# Patient Record
Sex: Male | Born: 1960
Health system: Southern US, Community
[De-identification: ages and names within clinical notes are randomized; demographics above are authoritative.]

## PROBLEM LIST (undated history)

## (undated) DIAGNOSIS — F419 Anxiety disorder, unspecified: Secondary | ICD-10-CM

## (undated) DIAGNOSIS — Z8673 Personal history of transient ischemic attack (TIA), and cerebral infarction without residual deficits: Secondary | ICD-10-CM

## (undated) DIAGNOSIS — E785 Hyperlipidemia, unspecified: Secondary | ICD-10-CM

## (undated) DIAGNOSIS — I10 Essential (primary) hypertension: Secondary | ICD-10-CM

## (undated) DIAGNOSIS — E559 Vitamin D deficiency, unspecified: Secondary | ICD-10-CM

## (undated) DIAGNOSIS — R7303 Prediabetes: Secondary | ICD-10-CM

## (undated) DIAGNOSIS — G43909 Migraine, unspecified, not intractable, without status migrainosus: Secondary | ICD-10-CM

## (undated) HISTORY — DX: Personal history of transient ischemic attack (TIA), and cerebral infarction without residual deficits: Z86.73

## (undated) HISTORY — DX: Essential (primary) hypertension: I10

## (undated) HISTORY — DX: Prediabetes: R73.03

## (undated) HISTORY — DX: Hyperlipidemia, unspecified: E78.5

## (undated) HISTORY — DX: Migraine, unspecified, not intractable, without status migrainosus: G43.909

## (undated) HISTORY — DX: Anxiety disorder, unspecified: F41.9

## (undated) HISTORY — DX: Vitamin D deficiency, unspecified: E55.9

---

## 1989-08-13 HISTORY — PX: REFRACTIVE SURGERY: SHX103

## 2012-02-22 ENCOUNTER — Other Ambulatory Visit (HOSPITAL_COMMUNITY): Payer: Self-pay | Admitting: Internal Medicine

## 2012-02-22 ENCOUNTER — Ambulatory Visit (HOSPITAL_COMMUNITY)
Admission: RE | Admit: 2012-02-22 | Discharge: 2012-02-22 | Disposition: A | Discharge status: Home / Self Care | Payer: BC Managed Care – PPO | Source: Ambulatory Visit | Attending: Internal Medicine | Admitting: Internal Medicine

## 2012-02-22 DIAGNOSIS — I10 Essential (primary) hypertension: Secondary | ICD-10-CM | POA: Insufficient documentation

## 2013-05-29 ENCOUNTER — Encounter: Payer: Self-pay | Admitting: Internal Medicine

## 2013-05-29 DIAGNOSIS — E119 Type 2 diabetes mellitus without complications: Secondary | ICD-10-CM | POA: Insufficient documentation

## 2013-05-29 DIAGNOSIS — E785 Hyperlipidemia, unspecified: Secondary | ICD-10-CM | POA: Insufficient documentation

## 2013-05-29 DIAGNOSIS — I10 Essential (primary) hypertension: Secondary | ICD-10-CM | POA: Insufficient documentation

## 2013-05-29 DIAGNOSIS — G43909 Migraine, unspecified, not intractable, without status migrainosus: Secondary | ICD-10-CM | POA: Insufficient documentation

## 2013-05-29 DIAGNOSIS — E1169 Type 2 diabetes mellitus with other specified complication: Secondary | ICD-10-CM | POA: Insufficient documentation

## 2013-05-29 DIAGNOSIS — E559 Vitamin D deficiency, unspecified: Secondary | ICD-10-CM | POA: Insufficient documentation

## 2013-05-29 DIAGNOSIS — F40231 Fear of injections and transfusions: Secondary | ICD-10-CM | POA: Insufficient documentation

## 2013-06-17 ENCOUNTER — Encounter: Payer: Self-pay | Admitting: Physician Assistant

## 2013-06-18 ENCOUNTER — Ambulatory Visit: Payer: Self-pay | Admitting: Physician Assistant

## 2013-06-30 ENCOUNTER — Encounter: Payer: Self-pay | Admitting: Physician Assistant

## 2013-06-30 ENCOUNTER — Encounter: Payer: PRIVATE HEALTH INSURANCE | Admitting: Physician Assistant

## 2013-06-30 NOTE — Progress Notes (Signed)
This encounter was created in error - please disregard.

## 2013-07-15 ENCOUNTER — Ambulatory Visit (INDEPENDENT_AMBULATORY_CARE_PROVIDER_SITE_OTHER): Payer: BC Managed Care – PPO | Admitting: Physician Assistant

## 2013-07-15 ENCOUNTER — Encounter: Payer: Self-pay | Admitting: Physician Assistant

## 2013-07-15 VITALS — BP 148/88 | HR 76 | Temp 98.0°F | Resp 16 | Wt 203.0 lb

## 2013-07-15 DIAGNOSIS — D485 Neoplasm of uncertain behavior of skin: Secondary | ICD-10-CM

## 2013-07-15 DIAGNOSIS — I10 Essential (primary) hypertension: Secondary | ICD-10-CM

## 2013-07-15 NOTE — Progress Notes (Signed)
Chief Complaint: Patient presents for evaluation of a skin lesion.   Exam: Filed Vitals:   07/15/13 1600  BP: 148/88  Pulse: 76  Temp: 98 F (36.7 C)  Resp: 16    Border: irref Color: Dark black with white and pink Size: 3x43mm Location:center mid back  Anesthesia: Lidocaine 1% without epinephrine without added sodium bicarbonate  Procedure Details   The risks, benefits, indications, potential complications, and alternatives were explained to the patient and informed consent obtained.  SUTURE: The lesion and surrounding area was given sterile prep using alcohol and draped in the usual sterile fashion. A 11 blade was used to excise an elliptical area of skin approximately 4cm by 4cm. The wound was closed with 4-0 Nylon using simple interrupted stitches. Antibiotic ointment and a sterile dressing applied. The specimen was sent for pathologic examination. The patient tolerated the procedure well with minimal blood loss.    Condition: Stable  Complications:  None  Diagnosis: 238.2- nevus of unknown origin HTN- monitor at home  Procedure code: 44010  Plan: 1. Instructed to keep the wound dry and covered for 24-48 hours and clean thereafter. 2. Warning signs of infection were reviewed.    3. Recommended that the patient use OTC acetaminophen as needed for pain.   4. Return for suture removal in 7 days.

## 2013-07-15 NOTE — Patient Instructions (Signed)
Wound Infection  A wound infection happens when a type of germ (bacteria) starts growing in the wound. In some cases, this can cause the wound to break open. If cared for properly, the infected wound will heal from the inside to the outside. Wound infections need treatment.  CAUSES  An infection is caused by bacteria growing in the wound.   SYMPTOMS    Increase in redness, swelling, or pain at the wound site.   Increase in drainage at the wound site.   Wound or bandage (dressing) starts to smell bad.   Fever.   Feeling tired or fatigued.   Pus draining from the wound.  TREATMENT   You caregiver will prescribe antibiotic medicine. The wound infection should improve within 24 to 48 hours. Any redness around the wound should stop spreading and the wound should be less painful.   HOME CARE INSTRUCTIONS    Only take over-the-counter or prescription medicines for pain, discomfort, or fever as directed by your caregiver.   Take your antibiotics as directed. Finish them even if you start to feel better.   Gently wash the area with mild soap and water 2 times a day, or as directed. Rinse off the soap. Pat the area dry with a clean towel. Do not rub the wound. This may cause bleeding.   Follow your caregiver's instructions for how often you need to change the dressing.   Apply ointment and a dressing to the wound as directed.   If the dressing sticks, moisten it with soapy water and gently remove it.   Change the bandage right away if it becomes wet, dirty, or develops a bad smell.   Take showers. Do not take tub baths, swim, or do anything that may soak the wound until it is healed.   Avoid exercises that make you sweat heavily.   Use anti-itch medicine as directed by your caregiver. The wound may itch when it is healing. Do not pick or scratch at the wound.   Follow up with your caregiver to get your wound rechecked as directed.  SEEK MEDICAL CARE IF:   You have an increase in swelling, pain, or redness  around the wound.   You have an increase in the amount of pus coming from the wound.   There is a bad smell coming from the wound.   More of the wound breaks open.   You have a fever.  MAKE SURE YOU:    Understand these instructions.   Will watch your condition.   Will get help right away if you are not doing well or get worse.  Document Released: 04/28/2003 Document Revised: 10/22/2011 Document Reviewed: 12/03/2010  [employer] Patient Information 2014 [employer].

## 2013-07-28 ENCOUNTER — Ambulatory Visit (INDEPENDENT_AMBULATORY_CARE_PROVIDER_SITE_OTHER): Payer: BC Managed Care – PPO | Admitting: Physician Assistant

## 2013-07-28 ENCOUNTER — Encounter: Payer: Self-pay | Admitting: Physician Assistant

## 2013-07-28 VITALS — BP 142/88 | HR 64 | Temp 98.0°F | Resp 16 | Wt 203.0 lb

## 2013-07-28 DIAGNOSIS — Z4802 Encounter for removal of sutures: Secondary | ICD-10-CM

## 2013-07-28 NOTE — Progress Notes (Signed)
Subjective:    Gabriel Hoover is a 52 y.o. male who had a dysplastic nevus removal from his mid back that required suture closure with 5 sutures.  He denies pain, redness, or drainage from the wound.   The following portions of the patient's history were reviewed and updated as appropriate: allergies, current medications, past family history, past medical history, past social history, past surgical history and problem list.  Review of Systems A comprehensive review of systems was negative.    Objective:    BP 142/88  Pulse 64  Temp(Src) 98 F (36.7 C)  Resp 16  Wt 203 lb (92.08 kg) Injury exam:  A 0.5 cm laceration noted on the mid back is healing well, without evidence of infection.    Assessment:    dysplastic nevus  Suture area is healing well, without evidence of infection.    Plan:     1. 5 sutures were removed. 2. Wound care discussed. 3. Follow up as needed.  4. Will continue to monitor other moles.

## 2013-08-19 ENCOUNTER — Ambulatory Visit (INDEPENDENT_AMBULATORY_CARE_PROVIDER_SITE_OTHER): Payer: BC Managed Care – PPO | Admitting: Physician Assistant

## 2013-08-19 ENCOUNTER — Encounter: Payer: Self-pay | Admitting: Physician Assistant

## 2013-08-19 VITALS — BP 140/88 | HR 68 | Temp 97.0°F | Resp 16 | Ht 69.0 in | Wt 202.0 lb

## 2013-08-19 DIAGNOSIS — I10 Essential (primary) hypertension: Secondary | ICD-10-CM

## 2013-08-19 DIAGNOSIS — E785 Hyperlipidemia, unspecified: Secondary | ICD-10-CM

## 2013-08-19 DIAGNOSIS — R7309 Other abnormal glucose: Secondary | ICD-10-CM

## 2013-08-19 DIAGNOSIS — Z79899 Other long term (current) drug therapy: Secondary | ICD-10-CM

## 2013-08-19 DIAGNOSIS — E559 Vitamin D deficiency, unspecified: Secondary | ICD-10-CM

## 2013-08-19 DIAGNOSIS — R7303 Prediabetes: Secondary | ICD-10-CM

## 2013-08-19 LAB — LIPID PANEL
Cholesterol: 231 mg/dL — ABNORMAL HIGH (ref 0–200)
HDL: 43 mg/dL (ref >39)
LDL Cholesterol: 152 mg/dL — ABNORMAL HIGH (ref 0–99)
TRIGLYCERIDES: 181 mg/dL — AB (ref <150)
Total CHOL/HDL Ratio: 5.4 Ratio
VLDL: 36 mg/dL (ref 0–40)

## 2013-08-19 LAB — CBC WITH DIFFERENTIAL/PLATELET
BASOS ABS: 0.1 10*3/uL (ref 0.0–0.1)
BASOS PCT: 1 % (ref 0–1)
EOS ABS: 0.3 10*3/uL (ref 0.0–0.7)
Eosinophils Relative: 4 % (ref 0–5)
HEMATOCRIT: 45.4 % (ref 39.0–52.0)
HEMOGLOBIN: 16.1 g/dL (ref 13.0–17.0)
Lymphocytes Relative: 30 % (ref 12–46)
Lymphs Abs: 2.5 10*3/uL (ref 0.7–4.0)
MCH: 29.4 pg (ref 26.0–34.0)
MCHC: 35.5 g/dL (ref 30.0–36.0)
MCV: 83 fL (ref 78.0–100.0)
MONO ABS: 0.6 10*3/uL (ref 0.1–1.0)
MONOS PCT: 8 % (ref 3–12)
NEUTROS ABS: 4.7 10*3/uL (ref 1.7–7.7)
Neutrophils Relative %: 57 % (ref 43–77)
Platelets: 260 10*3/uL (ref 150–400)
RBC: 5.47 MIL/uL (ref 4.22–5.81)
RDW: 13.4 % (ref 11.5–15.5)
WBC: 8.2 10*3/uL (ref 4.0–10.5)

## 2013-08-19 LAB — HEPATIC FUNCTION PANEL
ALBUMIN: 4.4 g/dL (ref 3.5–5.2)
ALT: 16 U/L (ref 0–53)
AST: 17 U/L (ref 0–37)
Alkaline Phosphatase: 95 U/L (ref 39–117)
Bilirubin, Direct: 0.1 mg/dL (ref 0.0–0.3)
Indirect Bilirubin: 0.6 mg/dL (ref 0.0–0.9)
TOTAL PROTEIN: 7.5 g/dL (ref 6.0–8.3)
Total Bilirubin: 0.7 mg/dL (ref 0.3–1.2)

## 2013-08-19 LAB — HEMOGLOBIN A1C
Hgb A1c MFr Bld: 6.5 % — ABNORMAL HIGH (ref <5.7)
MEAN PLASMA GLUCOSE: 140 mg/dL — AB (ref <117)

## 2013-08-19 LAB — BASIC METABOLIC PANEL WITH GFR
BUN: 15 mg/dL (ref 6–23)
CALCIUM: 9.8 mg/dL (ref 8.4–10.5)
CO2: 28 mEq/L (ref 19–32)
CREATININE: 1.06 mg/dL (ref 0.50–1.35)
Chloride: 100 mEq/L (ref 96–112)
GFR, EST NON AFRICAN AMERICAN: 80 mL/min
GLUCOSE: 140 mg/dL — AB (ref 70–99)
Potassium: 4 mEq/L (ref 3.5–5.3)
Sodium: 140 mEq/L (ref 135–145)

## 2013-08-19 LAB — TSH: TSH: 3.909 u[IU]/mL (ref 0.350–4.500)

## 2013-08-19 LAB — MAGNESIUM: Magnesium: 1.9 mg/dL (ref 1.5–2.5)

## 2013-08-19 NOTE — Patient Instructions (Addendum)
Lateral Epicondylitis (Tennis Elbow) with Rehab Lateral epicondylitis involves inflammation and pain around the outer portion of the elbow. The pain is caused by inflammation of the tendons in the forearm that bring back (extend) the wrist. Lateral epicondylittis is also called tennis elbow, because it is very common in tennis players. However, it may occur in any individual who extends the wrist repetitively. If lateral epicondylitis is left untreated, it may become a chronic problem. SYMPTOMS   Pain, tenderness, and inflammation on the outer (lateral) side of the elbow.  Pain or weakness with gripping activities.  Pain that increases with wrist twisting motions (playing tennis, using a screwdriver, opening a door or a jar).  Pain with lifting objects, including a coffee cup. CAUSES  Lateral epicondylitis is caused by inflammation of the tendons that extend the wrist. Causes of injury may include:  Repetitive stress and strain on the muscles and tendons that extend the wrist.  Sudden change in activity level or intensity.  Incorrect grip in racquet sports.  Incorrect grip size of racquet (often too large).  Incorrect hitting position or technique (usually backhand, leading with the elbow).  Using a racket that is too heavy. RISK INCREASES WITH:  Sports or occupations that require repetitive and/or strenuous forearm and wrist movements (tennis, squash, racquetball, carpentry).  Poor wrist and forearm strength and flexibility.  Failure to warm up properly before activity.  Resuming activity before healing, rehabilitation, and conditioning are complete. PREVENTION   Warm up and stretch properly before activity.  Maintain physical fitness:  Strength, flexibility, and endurance.  Cardiovascular fitness.  Wear and use properly fitted equipment.  Learn and use proper technique and have a coach correct improper technique.  Wear a tennis elbow (counterforce) brace. PROGNOSIS   The course of this condition depends on the degree of the injury. If treated properly, acute cases (symptoms lasting less than 4 weeks) are often resolved in 2 to 6 weeks. Chronic (longer lasting cases) often resolve in 3 to 6 months, but may require physical therapy. RELATED COMPLICATIONS   Frequently recurring symptoms, resulting in a chronic problem. Properly treating the problem the first time decreases frequency of recurrence.  Chronic inflammation, scarring tendon degeneration, and partial tendon tear, requiring surgery.  Delayed healing or resolution of symptoms. TREATMENT  Treatment first involves the use of ice and medicine, to reduce pain and inflammation. Strengthening and stretching exercises may help reduce discomfort, if performed regularly. These exercises may be performed at home, if the condition is an acute injury. Chronic cases may require a referral to a physical therapist for evaluation and treatment. Your caregiver may advise a corticosteroid injection, to help reduce inflammation. Rarely, surgery is needed. MEDICATION  If pain medicine is needed, nonsteroidal anti-inflammatory medicines (aspirin and ibuprofen), or other minor pain relievers (acetaminophen), are often advised.  Do not take pain medicine for 7 days before surgery.  Prescription pain relievers may be given, if your caregiver thinks they are needed. Use only as directed and only as much as you need.  Corticosteroid injections may be recommended. These injections should be reserved only for the most severe cases, because they can only be given a certain number of times. HEAT AND COLD  Cold treatment (icing) should be applied for 10 to 15 minutes every 2 to 3 hours for inflammation and pain, and immediately after activity that aggravates your symptoms. Use ice packs or an ice massage.  Heat treatment may be used before performing stretching and strengthening activities prescribed by your  caregiver, physical  therapist, or athletic trainer. Use a heat pack or a warm water soak. SEEK MEDICAL CARE IF: Symptoms get worse or do not improve in 2 weeks, despite treatment. EXERCISES  RANGE OF MOTION (ROM) AND STRETCHING EXERCISES - Epicondylitis, Lateral (Tennis Elbow) These exercises may help you when beginning to rehabilitate your injury. Your symptoms may go away with or without further involvement from your physician, physical therapist or athletic trainer. While completing these exercises, remember:   Restoring tissue flexibility helps normal motion to return to the joints. This allows healthier, less painful movement and activity.  An effective stretch should be held for at least 30 seconds.  A stretch should never be painful. You should only feel a gentle lengthening or release in the stretched tissue. RANGE OF MOTION  Wrist Flexion, Active-Assisted  Extend your right / left elbow with your fingers pointing down.*  Gently pull the back of your hand towards you, until you feel a gentle stretch on the top of your forearm.  Hold this position for __________ seconds. Repeat __________ times. Complete this exercise __________ times per day.  *If directed by your physician, physical therapist or athletic trainer, complete this stretch with your elbow bent, rather than extended. RANGE OF MOTION  Wrist Extension, Active-Assisted  Extend your right / left elbow and turn your palm upwards.*  Gently pull your palm and fingertips back, so your wrist extends and your fingers point more toward the ground.  You should feel a gentle stretch on the inside of your forearm.  Hold this position for __________ seconds. Repeat __________ times. Complete this exercise __________ times per day. *If directed by your physician, physical therapist or athletic trainer, complete this stretch with your elbow bent, rather than extended. STRETCH - Wrist Flexion  Place the back of your right / left hand on a tabletop,  leaving your elbow slightly bent. Your fingers should point away from your body.  Gently press the back of your hand down onto the table by straightening your elbow. You should feel a stretch on the top of your forearm.  Hold this position for __________ seconds. Repeat __________ times. Complete this stretch __________ times per day.  STRETCH  Wrist Extension   Place your right / left fingertips on a tabletop, leaving your elbow slightly bent. Your fingers should point backwards.  Gently press your fingers and palm down onto the table by straightening your elbow. You should feel a stretch on the inside of your forearm.  Hold this position for __________ seconds. Repeat __________ times. Complete this stretch __________ times per day.  STRENGTHENING EXERCISES - Epicondylitis, Lateral (Tennis Elbow) These exercises may help you when beginning to rehabilitate your injury. They may resolve your symptoms with or without further involvement from your physician, physical therapist or athletic trainer. While completing these exercises, remember:   Muscles can gain both the endurance and the strength needed for everyday activities through controlled exercises.  Complete these exercises as instructed by your physician, physical therapist or athletic trainer. Increase the resistance and repetitions only as guided.  You may experience muscle soreness or fatigue, but the pain or discomfort you are trying to eliminate should never worsen during these exercises. If this pain does get worse, stop and make sure you are following the directions exactly. If the pain is still present after adjustments, discontinue the exercise until you can discuss the trouble with your caregiver. STRENGTH Wrist Flexors  Sit with your right / left forearm palm-up and  fully supported on a table or countertop. Your elbow should be resting below the height of your shoulder. Allow your wrist to extend over the edge of the  surface.  Loosely holding a __________ weight, or a piece of rubber exercise band or tubing, slowly curl your hand up toward your forearm.  Hold this position for __________ seconds. Slowly lower the wrist back to the starting position in a controlled manner. Repeat __________ times. Complete this exercise __________ times per day.  STRENGTH  Wrist Extensors  Sit with your right / left forearm palm-down and fully supported on a table or countertop. Your elbow should be resting below the height of your shoulder. Allow your wrist to extend over the edge of the surface.  Loosely holding a __________ weight, or a piece of rubber exercise band or tubing, slowly curl your hand up toward your forearm.  Hold this position for __________ seconds. Slowly lower the wrist back to the starting position in a controlled manner. Repeat __________ times. Complete this exercise __________ times per day.  STRENGTH - Ulnar Deviators  Stand with a ____________________ weight in your right / left hand, or sit while holding a rubber exercise band or tubing, with your healthy arm supported on a table or countertop.  Move your wrist, so that your pinkie travels toward your forearm and your thumb moves away from your forearm.  Hold this position for __________ seconds and then slowly lower the wrist back to the starting position. Repeat __________ times. Complete this exercise __________ times per day STRENGTH - Radial Deviators  Stand with a ____________________ weight in your right / left hand, or sit while holding a rubber exercise band or tubing, with your injured arm supported on a table or countertop.  Raise your hand upward in front of you or pull up on the rubber tubing.  Hold this position for __________ seconds and then slowly lower the wrist back to the starting position. Repeat __________ times. Complete this exercise __________ times per day. STRENGTH  Forearm Supinators   Sit with your right /  left forearm supported on a table, keeping your elbow below shoulder height. Rest your hand over the edge, palm down.  Gently grip a hammer or a soup ladle.  Without moving your elbow, slowly turn your palm and hand upward to a "thumbs-up" position.  Hold this position for __________ seconds. Slowly return to the starting position. Repeat __________ times. Complete this exercise __________ times per day.  STRENGTH  Forearm Pronators   Sit with your right / left forearm supported on a table, keeping your elbow below shoulder height. Rest your hand over the edge, palm up.  Gently grip a hammer or a soup ladle.  Without moving your elbow, slowly turn your palm and hand upward to a "thumbs-up" position.  Hold this position for __________ seconds. Slowly return to the starting position. Repeat __________ times. Complete this exercise __________ times per day.  STRENGTH - Grip  Grasp a tennis ball, a dense sponge, or a large, rolled sock in your hand.  Squeeze as hard as you can, without increasing any pain.  Hold this position for __________ seconds. Release your grip slowly. Repeat __________ times. Complete this exercise __________ times per day.  STRENGTH - Elbow Extensors, Isometric  Stand or sit upright, on a firm surface. Place your right / left arm so that your palm faces your stomach, and it is at the height of your waist.  Place your opposite hand on  the underside of your forearm. Gently push up as your right / left arm resists. Push as hard as you can with both arms, without causing any pain or movement at your right / left elbow. Hold this stationary position for __________ seconds. Gradually release the tension in both arms. Allow your muscles to relax completely before repeating. Document Released: 07/30/2005 Document Revised: 10/22/2011 Document Reviewed: 11/11/2008 [facility] Patient Information 2014 [place], [place].    Bad carbs also include fruit juice, alcohol, and  sweet tea. These are empty calories that do not signal to your brain that you are full.   Please remember the good carbs are still carbs which convert into sugar. So please measure them out no more than 1/2-1 cup of rice, oatmeal, pasta, and beans.  Veggies are however free foods! Pile them on.   I like lean protein at every meal such as chicken, [place], pork chops, cottage cheese, etc. Just do not fry these meats and please center your meal around vegetable, the meats should be a side dish.   No all fruit is created equal. Please see the list below, the fruit at the bottom is higher in sugars than the fruit at the top

## 2013-08-19 NOTE — Progress Notes (Signed)
HPI Patient presents for 3 month follow up with hypertension, hyperlipidemia, prediabetes and vitamin D. Patient's BP is not checked at home today their BP is BP: 140/88 mmHg  Patient denies chest pain, shortness of breath, dizziness.  Patient's cholesterol is diet controlled. The cholesterol last visit was Trigs 246, HDL 39, LDL 139.  The patient has been working on diet and exercise for prediabetes, and denies changes in vision, polys, and paresthesias. A1C was 6.3 (6.2). Patient is on Vitamin D supplement.  Vitamin D was 58. And his testosterone was 299 but the patient did not want treatment at that time.  Right handed. Patient states he has a very sore spot  For two months on his left lateral epicondylitis. Worse with pulling and carrying things. Has started playing guitar.  TSH was 4.415 and we will repeat it this visit.  Current Medications:  Current Outpatient Prescriptions on File Prior to Visit  Medication Sig Dispense Refill  . ALPRAZolam (XANAX) 0.5 MG tablet Take 0.5 mg by mouth. Take one to two tablets one half hour to one hour prior to lab work      . amLODipine (NORVASC) 5 MG tablet Take 5 mg by mouth daily.      Marland Kitchen losartan-hydrochlorothiazide (HYZAAR) 100-25 MG per tablet Take 1 tablet by mouth daily.      Marland Kitchen VITAMIN D, ERGOCALCIFEROL, PO Take 5,000 Units by mouth daily.       No current facility-administered medications on file prior to visit.   Medical History:  Past Medical History  Diagnosis Date  . Anxiety   . Migraines   . Prediabetes   . Vitamin D deficiency   . Hyperlipidemia   . Hypertension    Allergies:  Allergies  Allergen Reactions  . Atenolol     ROS Constitutional: Denies fever, chills, headaches, insomnia, fatigue, night sweats Eyes: Denies redness, blurred vision, diplopia, discharge, itchy, watery eyes.  ENT: Denies congestion, post nasal drip, sore throat, earache, dental pain, Tinnitus, Vertigo, Sinus pain, snoring.  Cardio: Denies chest pain,  palpitations, irregular heartbeat, dyspnea, diaphoresis, orthopnea, PND, claudication, edema Respiratory: denies cough, shortness of breath, wheezing.  Gastrointestinal: Denies dysphagia, heartburn, AB pain/ cramps, N/V, diarrhea, constipation, hematemesis, melena, hematochezia,  hemorrhoids Genitourinary: Denies dysuria, frequency, urgency, nocturia, hesitancy, discharge, hematuria, flank pain Musculoskeletal: + left elbow pain Denies myalgia, stiffness, pain, swelling and strain/sprain. Skin: Denies pruritis, rash, changing in skin lesion Neuro: Denies Weakness, tremor, incoordination, spasms, pain Psychiatric: Denies confusion, memory loss, sensory loss Endocrine: Denies change in weight, skin, hair change, nocturia Diabetic Polys, Denies visual blurring, hyper /hypo glycemic episodes, and paresthesia, Heme/Lymph: Denies Excessive bleeding, bruising, enlarged lymph nodes  Family history- Review and unchanged Social history- Review and unchanged Physical Exam: Filed Vitals:   08/19/13 0841  BP: 140/88  Pulse: 68  Temp: 97 F (36.1 C)  Resp: 16   Filed Weights   08/19/13 0841  Weight: 202 lb (91.627 kg)   General Appearance: Well nourished, in no apparent distress. Eyes: PERRLA, EOMs, conjunctiva no swelling or erythema Sinuses: No Frontal/maxillary tenderness ENT/Mouth: Ext aud canals clear, TMs without erythema, bulging. No erythema, swelling, or exudate on post pharynx.  Tonsils not swollen or erythematous. Hearing normal.  Neck: Supple, thyroid normal.  Respiratory: Respiratory effort normal, BS equal bilaterally without rales, rhonchi, wheezing or stridor.  Cardio: RRR with no MRGs. Brisk peripheral pulses without edema.  Abdomen: Soft, + BS.  Non tender, no guarding, rebound, hernias, masses. Lymphatics: Non tender without lymphadenopathy.  Musculoskeletal: Full ROM, 5/5 strength, normal gait. Left lateral epicondyle tender without erythema, swelling.  Skin: Warm, dry  without rashes, lesions, ecchymosis.  Neuro: Cranial nerves intact. Normal muscle tone, no cerebellar symptoms. Sensation intact.  Psych: Awake and oriented X 3, normal affect, Insight and Judgment appropriate.   Assessment and Plan:  Hypertension: Continue medication, monitor blood pressure at home.  Continue DASH diet. Cholesterol: Continue diet and exercise. Check cholesterol.  Pre-diabetes-Continue diet and exercise. Check A1C Vitamin D Def- check level and continue medications.  Lateral epicondylitis- given instructions, aleve, RICE and if worsen call the office.   Continue diet and meds as discussed. Further disposition pending results of labs.  Vicie Mutters 8:53 AM

## 2013-08-20 LAB — VITAMIN D 25 HYDROXY (VIT D DEFICIENCY, FRACTURES): VIT D 25 HYDROXY: 56 ng/mL (ref 30–89)

## 2013-08-20 LAB — INSULIN, FASTING: Insulin fasting, serum: 14 u[IU]/mL (ref 3–28)

## 2013-11-17 ENCOUNTER — Encounter: Payer: Self-pay | Admitting: Physician Assistant

## 2013-11-17 ENCOUNTER — Ambulatory Visit (INDEPENDENT_AMBULATORY_CARE_PROVIDER_SITE_OTHER): Payer: BC Managed Care – PPO | Admitting: Physician Assistant

## 2013-11-17 VITALS — BP 140/88 | HR 68 | Temp 98.1°F | Resp 16 | Ht 69.0 in | Wt 200.0 lb

## 2013-11-17 DIAGNOSIS — E559 Vitamin D deficiency, unspecified: Secondary | ICD-10-CM

## 2013-11-17 DIAGNOSIS — Z79899 Other long term (current) drug therapy: Secondary | ICD-10-CM

## 2013-11-17 DIAGNOSIS — R7309 Other abnormal glucose: Secondary | ICD-10-CM

## 2013-11-17 DIAGNOSIS — R7303 Prediabetes: Secondary | ICD-10-CM

## 2013-11-17 DIAGNOSIS — E785 Hyperlipidemia, unspecified: Secondary | ICD-10-CM

## 2013-11-17 DIAGNOSIS — I10 Essential (primary) hypertension: Secondary | ICD-10-CM

## 2013-11-17 LAB — CBC WITH DIFFERENTIAL/PLATELET
BASOS PCT: 1 % (ref 0–1)
Basophils Absolute: 0.1 10*3/uL (ref 0.0–0.1)
Eosinophils Absolute: 0.3 10*3/uL (ref 0.0–0.7)
Eosinophils Relative: 4 % (ref 0–5)
HCT: 41.1 % (ref 39.0–52.0)
Hemoglobin: 14.8 g/dL (ref 13.0–17.0)
LYMPHS ABS: 2.4 10*3/uL (ref 0.7–4.0)
Lymphocytes Relative: 28 % (ref 12–46)
MCH: 29 pg (ref 26.0–34.0)
MCHC: 36 g/dL (ref 30.0–36.0)
MCV: 80.6 fL (ref 78.0–100.0)
Monocytes Absolute: 0.7 10*3/uL (ref 0.1–1.0)
Monocytes Relative: 8 % (ref 3–12)
NEUTROS PCT: 59 % (ref 43–77)
Neutro Abs: 5.1 10*3/uL (ref 1.7–7.7)
Platelets: 273 10*3/uL (ref 150–400)
RBC: 5.1 MIL/uL (ref 4.22–5.81)
RDW: 14 % (ref 11.5–15.5)
WBC: 8.6 10*3/uL (ref 4.0–10.5)

## 2013-11-17 LAB — HEMOGLOBIN A1C
Hgb A1c MFr Bld: 6.5 % — ABNORMAL HIGH (ref <5.7)
MEAN PLASMA GLUCOSE: 140 mg/dL — AB (ref <117)

## 2013-11-17 NOTE — Patient Instructions (Signed)
   Bad carbs also include fruit juice, alcohol, and sweet tea. These are empty calories that do not signal to your brain that you are full.   Please remember the good carbs are still carbs which convert into sugar. So please measure them out no more than 1/2-1 cup of rice, oatmeal, pasta, and beans.  Veggies are however free foods! Pile them on.   I like lean protein at every meal such as chicken, [place], pork chops, cottage cheese, etc. Just do not fry these meats and please center your meal around vegetable, the meats should be a side dish.   No all fruit is created equal. Please see the list below, the fruit at the bottom is higher in sugars than the fruit at the top   GET ON BENEFIBER 1-2 TBSP IN THE MORNING IN COFFEE OR WATER- WILL HELP WITH WEIGHTLOSS, CHOLESTEROL.

## 2013-11-17 NOTE — Progress Notes (Signed)
HPI 53 y.o. male  presents for 3 month follow up with hypertension, hyperlipidemia, diabetes and vitamin D. His blood pressure has been controlled at home, today their BP is BP: 140/88 mmHg He does not workout, he stays active. He denies chest pain, shortness of breath, dizziness.  He is not on cholesterol medication and denies myalgias. His cholesterol is not at goal. The cholesterol last visit was:   Lab Results  Component Value Date   CHOL 231* 08/19/2013   HDL 43 08/19/2013   LDLCALC 152* 08/19/2013   TRIG 181* 08/19/2013   CHOLHDL 5.4 08/19/2013   He has been working on diet and exercise for diabetes, last visit his A1C was 6.5, he did not want to get on a medication so he has cut out sweet tea and sodas, weight is down 3 lbs, and denies increased appetite, nausea, paresthesia of the feet and polydipsia. Last A1C in the office was:  Lab Results  Component Value Date   HGBA1C 6.5* 08/19/2013   Patient is on Vitamin D supplement.     Current Medications:  Current Outpatient Prescriptions on File Prior to Visit  Medication Sig Dispense Refill  . ALPRAZolam (XANAX) 0.5 MG tablet Take 0.5 mg by mouth. Take one to two tablets one half hour to one hour prior to lab work      . amLODipine (NORVASC) 5 MG tablet Take 5 mg by mouth daily.      Marland Kitchen losartan-hydrochlorothiazide (HYZAAR) 100-25 MG per tablet Take 1 tablet by mouth daily.      Marland Kitchen VITAMIN D, ERGOCALCIFEROL, PO Take 5,000 Units by mouth daily.       No current facility-administered medications on file prior to visit.   Medical History:  Past Medical History  Diagnosis Date  . Anxiety   . Migraines   . Prediabetes   . Vitamin D deficiency   . Hyperlipidemia   . Hypertension    Allergies:  Allergies  Allergen Reactions  . Atenolol      Review of Systems: [X]  = complains of  [ ]  = denies  General: Fatigue [ ]  Fever [ ]  Chills [ ]  Weakness [ ]   Insomnia [ ]  Eyes: Redness [ ]  Blurred vision [ ]  Diplopia [ ]   ENT: Congestion [ ]   Sinus Pain [ ]  Post Nasal Drip [ ]  Sore Throat [ ]  Earache [ ]   Cardiac: Chest pain/pressure [ ]  SOB [ ]  Orthopnea [ ]   Palpitations [ ]   Paroxysmal nocturnal dyspnea[ ]  Claudication [ ]  Edema [ ]   Pulmonary: Cough [ ]  Wheezing[ ]   SOB [ ]   Snoring [ ]   GI: Nausea [ ]  Vomiting[ ]  Dysphagia[ ]  Heartburn[ ]  Abdominal pain [ ]  Constipation [ ] ; Diarrhea [ ] ; BRBPR [ ]  Melena[ ]  GU: Hematuria[ ]  Dysuria [ ]  Nocturia[ ]  Urgency [ ]   Hesitancy [ ]  Discharge [ ]  Neuro: Headaches[ ]  Vertigo[ ]  Paresthesias[ ]  Spasm [ ]  Speech changes [ ]  Incoordination [ ]   Ortho: Arthritis [ ]  Joint pain [ ]  Muscle pain [ ]  Joint swelling [ ]  Back Pain [ ]  Skin:  Rash [ ]   Pruritis [ ]  Change in skin lesion [ ]   Psych: Depression[ ]  Anxiety[ ]  Confusion [ ]  Memory loss [ ]   Heme/Lypmh: Bleeding [ ]  Bruising [ ]  Enlarged lymph nodes [ ]   Endocrine: Visual blurring [ ]  Paresthesia [ ]  Polyuria [ ]  Polydypsea [ ]    Heat/cold intolerance [ ]  Hypoglycemia [ ]   Family  history- Review and unchanged Social history- Review and unchanged Physical Exam: BP 140/88  Pulse 68  Temp(Src) 98.1 F (36.7 C)  Resp 16  Ht 5\' 9"  (1.753 m)  Wt 200 lb (90.719 kg)  BMI 29.52 kg/m2 Wt Readings from Last 3 Encounters:  11/17/13 200 lb (90.719 kg)  08/19/13 202 lb (91.627 kg)  07/28/13 203 lb (92.08 kg)   General Appearance: Well nourished, in no apparent distress. Eyes: PERRLA, EOMs, conjunctiva no swelling or erythema Sinuses: No Frontal/maxillary tenderness ENT/Mouth: Ext aud canals clear, TMs without erythema, bulging. No erythema, swelling, or exudate on post pharynx.  Tonsils not swollen or erythematous. Hearing normal.  Neck: Supple, thyroid normal.  Respiratory: Respiratory effort normal, BS equal bilaterally without rales, rhonchi, wheezing or stridor.  Cardio: RRR with no MRGs. Brisk peripheral pulses without edema.  Abdomen: Soft, + BS.  Non tender, no guarding, rebound, hernias, masses. Lymphatics: Non tender without  lymphadenopathy.  Musculoskeletal: Full ROM, 5/5 strength, normal gait.  Skin: Warm, dry without rashes, lesions, ecchymosis.  Neuro: Cranial nerves intact. Normal muscle tone, no cerebellar symptoms. Sensation intact.  Psych: Awake and oriented X 3, normal affect, Insight and Judgment appropriate.   Assessment and Plan:  Hypertension: Continue medication, monitor blood pressure at home. Continue DASH diet. Cholesterol: Continue diet and exercise. Check cholesterol. Add benefiber, if not better next visit will have to add a medications Diabetes-Continue diet and exercise. Check A1C, he has cut back on tea/soda- pending number.  Vitamin D Def- check level and continue medications.   Continue diet and meds as discussed. Further disposition pending results of labs.  Vicie Mutters 11:25 AM

## 2013-11-18 LAB — LIPID PANEL
CHOLESTEROL: 224 mg/dL — AB (ref 0–200)
HDL: 39 mg/dL — AB (ref >39)
LDL Cholesterol: 145 mg/dL — ABNORMAL HIGH (ref 0–99)
Total CHOL/HDL Ratio: 5.7 Ratio
Triglycerides: 202 mg/dL — ABNORMAL HIGH (ref <150)
VLDL: 40 mg/dL (ref 0–40)

## 2013-11-18 LAB — BASIC METABOLIC PANEL WITH GFR
BUN: 12 mg/dL (ref 6–23)
CO2: 28 meq/L (ref 19–32)
CREATININE: 0.94 mg/dL (ref 0.50–1.35)
Calcium: 9.3 mg/dL (ref 8.4–10.5)
Chloride: 103 mEq/L (ref 96–112)
Glucose, Bld: 117 mg/dL — ABNORMAL HIGH (ref 70–99)
Potassium: 3.5 mEq/L (ref 3.5–5.3)
SODIUM: 140 meq/L (ref 135–145)

## 2013-11-18 LAB — HEPATIC FUNCTION PANEL
ALBUMIN: 4.5 g/dL (ref 3.5–5.2)
ALT: 26 U/L (ref 0–53)
AST: 19 U/L (ref 0–37)
Alkaline Phosphatase: 86 U/L (ref 39–117)
BILIRUBIN DIRECT: 0.1 mg/dL (ref 0.0–0.3)
BILIRUBIN TOTAL: 0.9 mg/dL (ref 0.2–1.2)
Indirect Bilirubin: 0.8 mg/dL (ref 0.2–1.2)
Total Protein: 7.1 g/dL (ref 6.0–8.3)

## 2013-11-18 LAB — MAGNESIUM: Magnesium: 2 mg/dL (ref 1.5–2.5)

## 2013-11-18 LAB — VITAMIN D 25 HYDROXY (VIT D DEFICIENCY, FRACTURES): Vit D, 25-Hydroxy: 66 ng/mL (ref 30–89)

## 2013-11-18 LAB — INSULIN, FASTING: Insulin fasting, serum: 6 u[IU]/mL (ref 3–28)

## 2013-11-18 LAB — TSH: TSH: 3.369 u[IU]/mL (ref 0.350–4.500)

## 2014-03-09 ENCOUNTER — Encounter: Payer: Self-pay | Admitting: Internal Medicine

## 2014-03-09 ENCOUNTER — Ambulatory Visit: Payer: Self-pay | Admitting: Physician Assistant

## 2014-03-19 ENCOUNTER — Other Ambulatory Visit: Payer: Self-pay | Admitting: Physician Assistant

## 2014-05-14 ENCOUNTER — Encounter: Payer: Self-pay | Admitting: Internal Medicine

## 2014-05-14 ENCOUNTER — Ambulatory Visit (INDEPENDENT_AMBULATORY_CARE_PROVIDER_SITE_OTHER): Payer: BC Managed Care – PPO | Admitting: Internal Medicine

## 2014-05-14 VITALS — BP 142/98 | HR 72 | Temp 98.2°F | Resp 16 | Ht 69.5 in | Wt 196.2 lb

## 2014-05-14 DIAGNOSIS — E782 Mixed hyperlipidemia: Secondary | ICD-10-CM

## 2014-05-14 DIAGNOSIS — Z113 Encounter for screening for infections with a predominantly sexual mode of transmission: Secondary | ICD-10-CM

## 2014-05-14 DIAGNOSIS — R6889 Other general symptoms and signs: Secondary | ICD-10-CM

## 2014-05-14 DIAGNOSIS — Z125 Encounter for screening for malignant neoplasm of prostate: Secondary | ICD-10-CM

## 2014-05-14 DIAGNOSIS — Z79899 Other long term (current) drug therapy: Secondary | ICD-10-CM

## 2014-05-14 DIAGNOSIS — Z0001 Encounter for general adult medical examination with abnormal findings: Secondary | ICD-10-CM

## 2014-05-14 DIAGNOSIS — E559 Vitamin D deficiency, unspecified: Secondary | ICD-10-CM

## 2014-05-14 DIAGNOSIS — E785 Hyperlipidemia, unspecified: Secondary | ICD-10-CM

## 2014-05-14 DIAGNOSIS — R7402 Elevation of levels of lactic acid dehydrogenase (LDH): Secondary | ICD-10-CM

## 2014-05-14 DIAGNOSIS — I10 Essential (primary) hypertension: Secondary | ICD-10-CM

## 2014-05-14 DIAGNOSIS — R7303 Prediabetes: Secondary | ICD-10-CM

## 2014-05-14 DIAGNOSIS — R740 Nonspecific elevation of levels of transaminase and lactic acid dehydrogenase [LDH]: Secondary | ICD-10-CM

## 2014-05-14 DIAGNOSIS — Z1212 Encounter for screening for malignant neoplasm of rectum: Secondary | ICD-10-CM

## 2014-05-14 DIAGNOSIS — R7309 Other abnormal glucose: Secondary | ICD-10-CM

## 2014-05-14 LAB — CBC WITH DIFFERENTIAL/PLATELET
BASOS ABS: 0.1 10*3/uL (ref 0.0–0.1)
Basophils Relative: 1 % (ref 0–1)
EOS PCT: 3 % (ref 0–5)
Eosinophils Absolute: 0.3 10*3/uL (ref 0.0–0.7)
HCT: 42.9 % (ref 39.0–52.0)
Hemoglobin: 15.1 g/dL (ref 13.0–17.0)
LYMPHS ABS: 2 10*3/uL (ref 0.7–4.0)
LYMPHS PCT: 23 % (ref 12–46)
MCH: 28.8 pg (ref 26.0–34.0)
MCHC: 35.2 g/dL (ref 30.0–36.0)
MCV: 81.9 fL (ref 78.0–100.0)
Monocytes Absolute: 0.7 10*3/uL (ref 0.1–1.0)
Monocytes Relative: 8 % (ref 3–12)
NEUTROS PCT: 65 % (ref 43–77)
Neutro Abs: 5.8 10*3/uL (ref 1.7–7.7)
Platelets: 252 10*3/uL (ref 150–400)
RBC: 5.24 MIL/uL (ref 4.22–5.81)
RDW: 13.9 % (ref 11.5–15.5)
WBC: 8.9 10*3/uL (ref 4.0–10.5)

## 2014-05-14 NOTE — Progress Notes (Signed)
Patient ID: Gabriel Hoover, male   DOB: [birth year], 53 y.o.   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 He is thus brought by his wife who accompanies him as he presents for complete physical.  Patient has been followed for HTN,  T2_NIDDM, Hyperlipidemia, and Vitamin D Deficiency.   HTN predates since 2008. Patient's BP has been controlled at home.Today's BP: 142/98 mmHg. Patient denies any cardiac symptoms as chest pain, palpitations, shortness of breath, dizziness or ankle swelling.   Patient's hyperlipidemia is not controlled with diet. Last lipids were Total Chol  224; HDL 39; LDL 145; Trig 202 on 11/17/2013.   Patient has A1c 5.8% in July 2013 and 6.2% in Mar 2014 a1c 6.3% in Sept 2014 and last A1c was 6.5% on 11/17/2013 meeting criteria for T2_NIDDM. Patient denies reactive hypoglycemic symptoms, visual blurring, diabetic polys, or paresthesias. .    Finally, patient has history of Vitamin D Deficiency of 23 in July 2013  and last vitamin D was  66 on 11/17/2013.  Medication Sig  . amLODipine (NORVASC) 5 MG tablet TAKE 1 TABLET BY MOUTH EVERY DAY  . losartan-hydrochlorothiazide (HYZAAR) 100-25 MG per tablet TAKE 1 TABLET BY MOUTH EVERY DAY  . VITAMIN D, ERGOCALCIFEROL, PO Take 5,000 Units by mouth daily.   Allergies  Allergen Reactions  . Atenolol ED & Fatigue   Past Medical History  Diagnosis Date  . Anxiety   . Migraines   . Prediabetes   . Vitamin D deficiency   . Hyperlipidemia   . Hypertension    No past surgical history on file. Family History  Problem Relation Age of Onset  . Hyperlipidemia Father   . Heart disease Father    History   Social History  . Marital Status: Married    Spouse Name: N/A    Number of Children: N/A  . Years of Education: N/A   Occupational History   . Minister   Social History Main Topics  . Smoking status: Never Smoker   . Smokeless tobacco: Never Used  . Alcohol Use: No  . Drug Use: No  . Sexual Activity: Not on file    ROS Constitutional: Denies fever, chills, weight loss/gain, headaches, insomnia, fatigue, night sweats or change in appetite. Eyes: Denies redness, blurred vision, diplopia, discharge, itchy or watery eyes.  ENT: Denies discharge, congestion, post nasal drip, epistaxis, sore throat, earache, hearing loss, dental pain, Tinnitus, Vertigo, Sinus pain or snoring.  Cardio: Denies chest pain, palpitations, irregular heartbeat, syncope, dyspnea, diaphoresis, orthopnea, PND, claudication or edema Respiratory: denies cough, dyspnea, DOE, pleurisy, hoarseness, laryngitis or wheezing.  Gastrointestinal: Denies dysphagia, heartburn, reflux, water brash, pain, cramps, nausea, vomiting, bloating, diarrhea, constipation, hematemesis, melena, hematochezia, jaundice or hemorrhoids Genitourinary: Denies dysuria, frequency, urgency, nocturia, hesitancy, discharge, hematuria or flank pain Musculoskeletal: Denies arthralgia, myalgia, stiffness, Jt. Swelling, pain, limp or strain/sprain. Denies Falls. Skin: Denies puritis, rash, hives, warts, acne, eczema or change in skin lesion Neuro: No weakness, tremor, incoordination, spasms, paresthesia or pain Psychiatric: Denies confusion, memory loss or sensory loss. Denies Depression. Endocrine: Denies change in weight, skin, hair change, nocturia, and paresthesia, diabetic polys, visual blurring or hyper / hypo glycemic episodes.  Heme/Lymph: No excessive bleeding, bruising or enlarged lymph nodes.  Physical Exam  BP 142/98  Pulse 72  Temp 98.2 F  Resp 16  Ht 5' 9.5"   Wt 196 lb 3.2 oz   BMI 28.57 kg/m2  General Appearance: Well nourished, in no apparent distress. Eyes: PERRLA, EOMs, conjunctiva no swelling or erythema, normal fundi and vessels. Sinuses: No frontal/maxillary  tenderness ENT/Mouth: EACs patent / TMs  nl. Nares clear without erythema, swelling, mucoid exudates. Oral hygiene is good. No erythema, swelling, or exudate. Tongue normal, non-obstructing. Tonsils not swollen or erythematous. Hearing normal.  Neck: Supple, thyroid normal. No bruits, nodes or JVD. Respiratory: Respiratory effort normal.  BS equal and clear bilateral without rales, rhonci, wheezing or stridor. Cardio: Heart sounds are normal with regular rate and rhythm and no murmurs, rubs or gallops. Peripheral pulses are normal and equal bilaterally without edema. No aortic or femoral bruits. Chest: symmetric with normal excursions and percussion.  Abdomen: Flat, soft, with bowl sounds. Nontender, no guarding, rebound, hernias, masses, or organomegaly.  Lymphatics: Non tender without lymphadenopathy.  Genitourinary: No hernias.Testes nl. DRE - prostate nl for age - smooth & firm w/o nodules. Musculoskeletal: Full ROM all peripheral extremities, joint stability, 5/5 strength, and normal gait. Skin: Warm and dry without rashes, lesions, cyanosis, clubbing or  ecchymosis.  Neuro: Cranial nerves intact, reflexes equal bilaterally. Normal muscle tone, no cerebellar symptoms. Sensation intact.  Pysch: Awake and oriented X 3with normal affect, insight and judgment appropriate.   Assessment and Plan  1. Annual Screening Examination 2. Hypertension, Poorly controlled 3. Hyperlipidemia 4. T2_NIDDM 5. Vitamin D Deficiency   Continue prudent diet as discussed, weight control, BP monitoring, regular exercise, and medications as discussed.  Discussed med effects and SE's. Routine screening labs and tests as requested with regular follow-up as recommended.  Strongly encouraged patient to regularly monitor BPs and call if elevated greater than 140/90.

## 2014-05-14 NOTE — Patient Instructions (Signed)
Recommend the book "The END of DIETING" by Dr Baker Janus   and the book "The END of DIABETES " by Dr Excell Seltzer  At [email or web address] - get book & Audio CD's      Being diabetic has a  300% increased risk for heart attack, stroke, cancer, and alzheimer- type vascular dementia. It is very important that you work harder with diet by avoiding all foods that are white except chicken & fish. Avoid white rice (brown & wild rice is OK), white potatoes (sweetpotatoes in moderation is OK), White bread or wheat bread or anything made out of white flour like bagels, donuts, rolls, buns, biscuits, cakes, pastries, cookies, pizza crust, and pasta (made from white flour & egg whites) - vegetarian pasta or spinach or wheat pasta is OK. Multigrain breads like Arnold's or [employer], or multigrain sandwich thins or flatbreads.  Diet, exercise and weight loss can reverse and cure diabetes in the early stages.  Diet, exercise and weight loss is very important in the control and prevention of complications of diabetes which affects every system in your body, ie. Brain - dementia/stroke, eyes - glaucoma/blindness, heart - heart attack/heart failure, kidneys - dialysis, stomach - gastric paralysis, intestines - malabsorption, nerves - severe painful neuritis, circulation - gangrene & loss of a leg(s), and finally cancer and Alzheimers.    I recommend avoid fried & greasy foods,  sweets/candy, white rice (brown or wild rice or Quinoa is OK), white potatoes (sweet potatoes are OK) - anything made from white flour - bagels, doughnuts, rolls, buns, biscuits,white and wheat breads, pizza crust and traditional pasta made of white flour & egg white(vegetarian pasta or spinach or wheat pasta is OK).  Multi-grain bread is OK - like multi-grain flat bread or sandwich thins. Avoid alcohol in excess. Exercise is also important.    Eat all the vegetables you want - avoid meat, especially red meat and dairy - especially cheese.  Cheese  is the most concentrated form of trans-fats which is the worst thing to clog up our arteries. Veggie cheese is OK which can be found in the fresh produce section at [employer] or [employer] or [employer]  Preventive Care for Adults A healthy lifestyle and preventive care can promote health and wellness. Preventive health guidelines for men include the following key practices:  A routine yearly physical is a good way to check with your health care provider about your health and preventative screening. It is a chance to share any concerns and updates on your health and to receive a thorough exam.  Visit your dentist for a routine exam and preventative care every 6 months. Brush your teeth twice a day and floss once a day. Good oral hygiene prevents tooth decay and gum disease.  The frequency of eye exams is based on your age, health, family medical history, use of contact lenses, and other factors. Follow your health care provider's recommendations for frequency of eye exams.  Eat a healthy diet. Foods such as vegetables, fruits, whole grains, low-fat dairy products, and lean protein foods contain the nutrients you need without too many calories. Decrease your intake of foods high in solid fats, added sugars, and salt. Eat the right amount of calories for you.Get information about a proper diet from your health care provider, if necessary.  Regular physical exercise is one of the most important things you can do for your health. Most adults should get at least 150 minutes of moderate-intensity exercise (any activity that  increases your heart rate and causes you to sweat) each week. In addition, most adults need muscle-strengthening exercises on 2 or more days a week.  Maintain a healthy weight. The body mass index (BMI) is a screening tool to identify possible weight problems. It provides an estimate of body fat based on height and weight. Your health care provider can find your BMI and can help you  achieve or maintain a healthy weight.For adults 20 years and older:  A BMI below 18.5 is considered underweight.  A BMI of 18.5 to 24.9 is normal.  A BMI of 25 to 29.9 is considered overweight.  A BMI of 30 and above is considered obese.  Maintain normal blood lipids and cholesterol levels by exercising and minimizing your intake of saturated fat. Eat a balanced diet with plenty of fruit and vegetables. Blood tests for lipids and cholesterol should begin at age 20 and be repeated every 5 years. If your lipid or cholesterol levels are high, you are over 50, or you are at high risk for heart disease, you may need your cholesterol levels checked more frequently.Ongoing high lipid and cholesterol levels should be treated with medicines if diet and exercise are not working.  If you smoke, find out from your health care provider how to quit. If you do not use tobacco, do not start.  Lung cancer screening is recommended for adults aged 72-80 years who are at high risk for developing lung cancer because of a history of smoking. A yearly low-dose CT scan of the lungs is recommended for people who have at least a 30-pack-year history of smoking and are a current smoker or have quit within the past 15 years. A pack year of smoking is smoking an average of 1 pack of cigarettes a day for 1 year (for example: 1 pack a day for 30 years or 2 packs a day for 15 years). Yearly screening should continue until the smoker has stopped smoking for at least 15 years. Yearly screening should be stopped for people who develop a health problem that would prevent them from having lung cancer treatment.  If you choose to drink alcohol, do not have more than 2 drinks per day. One drink is considered to be 12 ounces (355 mL) of beer, 5 ounces (148 mL) of wine, or 1.5 ounces (44 mL) of liquor.  Avoid use of street drugs. Do not share needles with anyone. Ask for help if you need support or instructions about stopping the use of  drugs.  High blood pressure causes heart disease and increases the risk of stroke. Your blood pressure should be checked at least every 1-2 years. Ongoing high blood pressure should be treated with medicines, if weight loss and exercise are not effective.  If you are 28-64 years old, ask your health care provider if you should take aspirin to prevent heart disease.  Diabetes screening involves taking a blood sample to check your fasting blood sugar level. This should be done once every 3 years, after age 13, if you are within normal weight and without risk factors for diabetes. Testing should be considered at a younger age or be carried out more frequently if you are overweight and have at least 1 risk factor for diabetes.  Colorectal cancer can be detected and often prevented. Most routine colorectal cancer screening begins at the age of 78 and continues through age 56. However, your health care provider may recommend screening at an earlier age if you have risk  factors for colon cancer. On a yearly basis, your health care provider may provide home test kits to check for hidden blood in the stool. Use of a small camera at the end of a tube to directly examine the colon (sigmoidoscopy or colonoscopy) can detect the earliest forms of colorectal cancer. Talk to your health care provider about this at age 48, when routine screening begins. Direct exam of the colon should be repeated every 5-10 years through age 60, unless early forms of precancerous polyps or small growths are found.  People who are at an increased risk for hepatitis B should be screened for this virus. You are considered at high risk for hepatitis B if:  You were born in a country where hepatitis B occurs often. Talk with your health care provider about which countries are considered high risk.  Your parents were born in a high-risk country and you have not received a shot to protect against hepatitis B (hepatitis B vaccine).  You have  HIV or AIDS.  You use needles to inject street drugs.  You live with, or have sex with, someone who has hepatitis B.  You are a man who has sex with other men (MSM).  You get hemodialysis treatment.  You take certain medicines for conditions such as cancer, organ transplantation, and autoimmune conditions.  Hepatitis C blood testing is recommended for all people born from [birth year] through 1965 and any individual with known risks for hepatitis C.  Practice safe sex. Use condoms and avoid high-risk sexual practices to reduce the spread of sexually transmitted infections (STIs). STIs include gonorrhea, chlamydia, syphilis, trichomonas, herpes, HPV, and human immunodeficiency virus (HIV). Herpes, HIV, and HPV are viral illnesses that have no cure. They can result in disability, cancer, and death.  If you are at risk of being infected with HIV, it is recommended that you take a prescription medicine daily to prevent HIV infection. This is called preexposure prophylaxis (PrEP). You are considered at risk if:  You are a man who has sex with other men (MSM) and have other risk factors.  You are a heterosexual man, are sexually active, and are at increased risk for HIV infection.  You take drugs by injection.  You are sexually active with a partner who has HIV.  Talk with your health care provider about whether you are at high risk of being infected with HIV. If you choose to begin PrEP, you should first be tested for HIV. You should then be tested every 3 months for as long as you are taking PrEP.  A one-time screening for abdominal aortic aneurysm (AAA) and surgical repair of large AAAs by ultrasound are recommended for men ages 51 to 11 years who are current or former smokers.  Healthy men should no longer receive prostate-specific antigen (PSA) blood tests as part of routine cancer screening. Talk with your health care provider about prostate cancer screening.  Testicular cancer screening is  not recommended for adult males who have no symptoms. Screening includes self-exam, a health care provider exam, and other screening tests. Consult with your health care provider about any symptoms you have or any concerns you have about testicular cancer.  Use sunscreen. Apply sunscreen liberally and repeatedly throughout the day. You should seek shade when your shadow is shorter than you. Protect yourself by wearing long sleeves, pants, a wide-brimmed hat, and sunglasses year round, whenever you are outdoors.  Once a month, do a whole-body skin exam, using a mirror to look  at the skin on your back. Tell your health care provider about new moles, moles that have irregular borders, moles that are larger than a pencil eraser, or moles that have changed in shape or color.  Stay current with required vaccines (immunizations).  Influenza vaccine. All adults should be immunized every year.  Tetanus, diphtheria, and acellular pertussis (Td, Tdap) vaccine. An adult who has not previously received Tdap or who does not know his vaccine status should receive 1 dose of Tdap. This initial dose should be followed by tetanus and diphtheria toxoids (Td) booster doses every 10 years. Adults with an unknown or incomplete history of completing a 3-dose immunization series with Td-containing vaccines should begin or complete a primary immunization series including a Tdap dose. Adults should receive a Td booster every 10 years.  Varicella vaccine. An adult without evidence of immunity to varicella should receive 2 doses or a second dose if he has previously received 1 dose.  Human papillomavirus (HPV) vaccine. Males aged 29-21 years who have not received the vaccine previously should receive the 3-dose series. Males aged 22-26 years may be immunized. Immunization is recommended through the age of 26 years for any male who has sex with males and did not get any or all doses earlier. Immunization is recommended for any  person with an immunocompromised condition through the age of 29 years if he did not get any or all doses earlier. During the 3-dose series, the second dose should be obtained 4-8 weeks after the first dose. The third dose should be obtained 24 weeks after the first dose and 16 weeks after the second dose.  Zoster vaccine. One dose is recommended for adults aged 38 years or older unless certain conditions are present.  Measles, mumps, and rubella (MMR) vaccine. Adults born before [birth year] generally are considered immune to measles and mumps. Adults born in [birth year] or later should have 1 or more doses of MMR vaccine unless there is a contraindication to the vaccine or there is laboratory evidence of immunity to each of the three diseases. A routine second dose of MMR vaccine should be obtained at least 28 days after the first dose for students attending postsecondary schools, health care workers, or international travelers. People who received inactivated measles vaccine or an unknown type of measles vaccine during 1963-1967 should receive 2 doses of MMR vaccine. People who received inactivated mumps vaccine or an unknown type of mumps vaccine before 1979 and are at high risk for mumps infection should consider immunization with 2 doses of MMR vaccine. Unvaccinated health care workers born before [birth year] who lack laboratory evidence of measles, mumps, or rubella immunity or laboratory confirmation of disease should consider measles and mumps immunization with 2 doses of MMR vaccine or rubella immunization with 1 dose of MMR vaccine.  Pneumococcal 13-valent conjugate (PCV13) vaccine. When indicated, a person who is uncertain of his immunization history and has no record of immunization should receive the PCV13 vaccine. An adult aged 68 years or older who has certain medical conditions and has not been previously immunized should receive 1 dose of PCV13 vaccine. This PCV13 should be followed with a dose of pneumococcal  polysaccharide (PPSV23) vaccine. The PPSV23 vaccine dose should be obtained at least 8 weeks after the dose of PCV13 vaccine. An adult aged 95 years or older who has certain medical conditions and previously received 1 or more doses of PPSV23 vaccine should receive 1 dose of PCV13. The PCV13 vaccine dose should be obtained 1  or more years after the last PPSV23 vaccine dose.  Pneumococcal polysaccharide (PPSV23) vaccine. When PCV13 is also indicated, PCV13 should be obtained first. All adults aged 65 years and older should be immunized. An adult younger than age 65 years who has certain medical conditions should be immunized. Any person who resides in a nursing home or long-term care facility should be immunized. An adult smoker should be immunized. People with an immunocompromised condition and certain other conditions should receive both PCV13 and PPSV23 vaccines. People with human immunodeficiency virus (HIV) infection should be immunized as soon as possible after diagnosis. Immunization during chemotherapy or radiation therapy should be avoided. Routine use of PPSV23 vaccine is not recommended for American Indians, [place] Natives, or people younger than 65 years unless there are medical conditions that require PPSV23 vaccine. When indicated, people who have unknown immunization and have no record of immunization should receive PPSV23 vaccine. One-time revaccination 5 years after the first dose of PPSV23 is recommended for people aged 19-64 years who have chronic kidney failure, nephrotic syndrome, asplenia, or immunocompromised conditions. People who received 1-2 doses of PPSV23 before age 65 years should receive another dose of PPSV23 vaccine at age 65 years or later if at least 5 years have passed since the previous dose. Doses of PPSV23 are not needed for people immunized with PPSV23 at or after age 65 years.  Meningococcal vaccine. Adults with asplenia or persistent complement component deficiencies  should receive 2 doses of quadrivalent meningococcal conjugate (MenACWY-D) vaccine. The doses should be obtained at least 2 months apart. Microbiologists working with certain meningococcal bacteria, military recruits, people at risk during an outbreak, and people who travel to or live in countries with a high rate of meningitis should be immunized. A first-year college student up through age 21 years who is living in a residence hall should receive a dose if he did not receive a dose on or after his 16th birthday. Adults who have certain high-risk conditions should receive one or more doses of vaccine.  Hepatitis A vaccine. Adults who wish to be protected from this disease, have certain high-risk conditions, work with hepatitis A-infected animals, work in hepatitis A research labs, or travel to or work in countries with a high rate of hepatitis A should be immunized. Adults who were previously unvaccinated and who anticipate close contact with an international adoptee during the first 60 days after arrival in the [place] from a country with a high rate of hepatitis A should be immunized.  Hepatitis B vaccine. Adults should be immunized if they wish to be protected from this disease, have certain high-risk conditions, may be exposed to blood or other infectious body fluids, are household contacts or sex partners of hepatitis B positive people, are clients or workers in certain care facilities, or travel to or work in countries with a high rate of hepatitis B.  Haemophilus influenzae type b (Hib) vaccine. A previously unvaccinated person with asplenia or sickle cell disease or having a scheduled splenectomy should receive 1 dose of Hib vaccine. Regardless of previous immunization, a recipient of a hematopoietic stem cell transplant should receive a 3-dose series 6-12 months after his successful transplant. Hib vaccine is not recommended for adults with HIV infection. Preventive Service / Frequency Ages  40 to 64  Blood pressure check.** / Every 1 to 2 years.  Lipid and cholesterol check.** / Every 5 years beginning at age 20.  Lung cancer screening. / Every year if you are aged 55-80   aged 76-80 years and have a 30-pack-year history of smoking and currently smoke or have quit within the past 15 years. Yearly screening is stopped once you have quit smoking for at least 15 years or develop a health problem that would prevent you from having lung cancer treatment.  Fecal occult blood test (FOBT) of stool. / Every year beginning at age 16 and continuing until age 80. You may not have to do this test if you get a colonoscopy every 10 years.  Flexible sigmoidoscopy** or colonoscopy.** / Every 5 years for a flexible sigmoidoscopy or every 10 years for a colonoscopy beginning at age 72 and continuing until age 52.  Abdominal aortic aneurysm (AAA) screening for persons with hypertension or  who are current or former smokers.  Hepatitis C blood test.** / For all people born from [birth year] through 1965 and any individual with known risks for hepatitis C.  Skin self-exam. / Monthly.  Influenza vaccine. / Every year.  Tetanus, diphtheria, and acellular pertussis (Tdap/Td) vaccine.** / Consult your health care provider. 1 dose of Td every 10 years.  Varicella vaccine.** / Consult your health care provider.  Zoster vaccine.** / 1 dose for adults aged 49 years or older.  Measles, mumps, rubella (MMR) vaccine.** / You need at least 1 dose of MMR if you were born in 1957 or later. You may also need a second dose.  Pneumococcal 13-valent conjugate (PCV13) vaccine.** / Consult your health care provider.  Pneumococcal polysaccharide (PPSV23) vaccine.** / 1 to 2 doses if you smoke cigarettes or if you have certain conditions.  Meningococcal vaccine.** / Consult your health care provider.  Hepatitis A vaccine.** / Consult your health care provider.  Hepatitis B vaccine.** / Consult your health care  provider.  Haemophilus influenzae type b (Hib) vaccine.** / Consult your health care provider.

## 2014-05-15 LAB — RPR

## 2014-05-15 LAB — VITAMIN B12: VITAMIN B 12: 402 pg/mL (ref 211–911)

## 2014-05-15 LAB — MAGNESIUM: Magnesium: 1.7 mg/dL (ref 1.5–2.5)

## 2014-05-15 LAB — BASIC METABOLIC PANEL WITH GFR
BUN: 14 mg/dL (ref 6–23)
CHLORIDE: 100 meq/L (ref 96–112)
CO2: 30 meq/L (ref 19–32)
Calcium: 9.6 mg/dL (ref 8.4–10.5)
Creat: 1.07 mg/dL (ref 0.50–1.35)
GFR, EST NON AFRICAN AMERICAN: 79 mL/min
GFR, Est African American: >89 mL/min
Glucose, Bld: 126 mg/dL — ABNORMAL HIGH (ref 70–99)
POTASSIUM: 3.3 meq/L — AB (ref 3.5–5.3)
SODIUM: 140 meq/L (ref 135–145)

## 2014-05-15 LAB — HEPATIC FUNCTION PANEL
ALBUMIN: 4.4 g/dL (ref 3.5–5.2)
ALT: 28 U/L (ref 0–53)
AST: 27 U/L (ref 0–37)
Alkaline Phosphatase: 89 U/L (ref 39–117)
BILIRUBIN INDIRECT: 1 mg/dL (ref 0.2–1.2)
BILIRUBIN TOTAL: 1.1 mg/dL (ref 0.2–1.2)
Bilirubin, Direct: 0.1 mg/dL (ref 0.0–0.3)
Total Protein: 7 g/dL (ref 6.0–8.3)

## 2014-05-15 LAB — LIPID PANEL
Cholesterol: 200 mg/dL (ref 0–200)
HDL: 35 mg/dL — AB (ref >39)
LDL Cholesterol: 116 mg/dL — ABNORMAL HIGH (ref 0–99)
TRIGLYCERIDES: 246 mg/dL — AB (ref <150)
Total CHOL/HDL Ratio: 5.7 Ratio
VLDL: 49 mg/dL — ABNORMAL HIGH (ref 0–40)

## 2014-05-15 LAB — MICROALBUMIN / CREATININE URINE RATIO
CREATININE, URINE: 82.9 mg/dL
MICROALB/CREAT RATIO: 14.5 mg/g (ref 0.0–30.0)
Microalb, Ur: 1.2 mg/dL (ref <2.0)

## 2014-05-15 LAB — INSULIN, FASTING: INSULIN FASTING, SERUM: 5 u[IU]/mL (ref 2.0–19.6)

## 2014-05-15 LAB — HEPATITIS B SURFACE ANTIBODY,QUALITATIVE: Hep B S Ab: NEGATIVE

## 2014-05-15 LAB — HEMOGLOBIN A1C
Hgb A1c MFr Bld: 6.4 % — ABNORMAL HIGH (ref <5.7)
Mean Plasma Glucose: 137 mg/dL — ABNORMAL HIGH (ref <117)

## 2014-05-15 LAB — VITAMIN D 25 HYDROXY (VIT D DEFICIENCY, FRACTURES): Vit D, 25-Hydroxy: 48 ng/mL (ref 30–89)

## 2014-05-15 LAB — HEPATITIS C ANTIBODY: HCV Ab: NEGATIVE

## 2014-05-15 LAB — URINALYSIS, MICROSCOPIC ONLY
Bacteria, UA: NONE SEEN
Casts: NONE SEEN
Crystals: NONE SEEN
SQUAMOUS EPITHELIAL / LPF: NONE SEEN

## 2014-05-15 LAB — PSA: PSA: 1.05 ng/mL (ref <=4.00)

## 2014-05-15 LAB — HIV ANTIBODY (ROUTINE TESTING W REFLEX): HIV 1&2 Ab, 4th Generation: NONREACTIVE

## 2014-05-15 LAB — TESTOSTERONE: TESTOSTERONE: 229 ng/dL — AB (ref 300–890)

## 2014-05-15 LAB — TSH: TSH: 2.964 u[IU]/mL (ref 0.350–4.500)

## 2014-05-15 LAB — HEPATITIS A ANTIBODY, TOTAL: HEP A TOTAL AB: REACTIVE — AB

## 2014-05-15 LAB — HEPATITIS B CORE ANTIBODY, TOTAL: Hep B Core Total Ab: NONREACTIVE

## 2014-05-17 LAB — HEPATITIS B E ANTIBODY: Hepatitis Be Antibody: NONREACTIVE

## 2014-06-23 ENCOUNTER — Other Ambulatory Visit: Payer: Self-pay | Admitting: Physician Assistant

## 2014-08-27 ENCOUNTER — Ambulatory Visit: Payer: Self-pay | Admitting: Physician Assistant

## 2014-08-30 ENCOUNTER — Encounter: Payer: Self-pay | Admitting: Internal Medicine

## 2014-09-20 ENCOUNTER — Other Ambulatory Visit: Payer: Self-pay | Admitting: Internal Medicine

## 2014-12-02 ENCOUNTER — Ambulatory Visit: Payer: Self-pay | Admitting: Internal Medicine

## 2015-05-19 ENCOUNTER — Encounter: Payer: Self-pay | Admitting: Internal Medicine

## 2015-05-19 ENCOUNTER — Ambulatory Visit (INDEPENDENT_AMBULATORY_CARE_PROVIDER_SITE_OTHER): Payer: 59 | Admitting: Internal Medicine

## 2015-05-19 VITALS — BP 150/100 | HR 84 | Temp 97.9°F | Resp 14 | Ht 69.0 in | Wt 188.0 lb

## 2015-05-19 DIAGNOSIS — Z9114 Patient's other noncompliance with medication regimen: Secondary | ICD-10-CM

## 2015-05-19 DIAGNOSIS — Z1212 Encounter for screening for malignant neoplasm of rectum: Secondary | ICD-10-CM

## 2015-05-19 DIAGNOSIS — Z0001 Encounter for general adult medical examination with abnormal findings: Secondary | ICD-10-CM

## 2015-05-19 DIAGNOSIS — I10 Essential (primary) hypertension: Secondary | ICD-10-CM | POA: Diagnosis not present

## 2015-05-19 DIAGNOSIS — Z125 Encounter for screening for malignant neoplasm of prostate: Secondary | ICD-10-CM

## 2015-05-19 DIAGNOSIS — E785 Hyperlipidemia, unspecified: Secondary | ICD-10-CM

## 2015-05-19 DIAGNOSIS — R5383 Other fatigue: Secondary | ICD-10-CM

## 2015-05-19 DIAGNOSIS — F411 Generalized anxiety disorder: Secondary | ICD-10-CM

## 2015-05-19 DIAGNOSIS — E663 Overweight: Secondary | ICD-10-CM | POA: Insufficient documentation

## 2015-05-19 DIAGNOSIS — Z79899 Other long term (current) drug therapy: Secondary | ICD-10-CM

## 2015-05-19 DIAGNOSIS — Z0000 Encounter for general adult medical examination without abnormal findings: Secondary | ICD-10-CM

## 2015-05-19 DIAGNOSIS — E559 Vitamin D deficiency, unspecified: Secondary | ICD-10-CM

## 2015-05-19 DIAGNOSIS — E349 Endocrine disorder, unspecified: Secondary | ICD-10-CM

## 2015-05-19 DIAGNOSIS — R7303 Prediabetes: Secondary | ICD-10-CM

## 2015-05-19 DIAGNOSIS — Z6828 Body mass index (BMI) 28.0-28.9, adult: Secondary | ICD-10-CM

## 2015-05-19 LAB — BASIC METABOLIC PANEL WITH GFR
BUN: 16 mg/dL (ref 7–25)
CALCIUM: 9.4 mg/dL (ref 8.6–10.3)
CO2: 28 mmol/L (ref 20–31)
CREATININE: 0.95 mg/dL (ref 0.70–1.33)
Chloride: 104 mmol/L (ref 98–110)
GFR, Est Non African American: >89 mL/min (ref >=60)
Glucose, Bld: 124 mg/dL — ABNORMAL HIGH (ref 65–99)
Potassium: 3.8 mmol/L (ref 3.5–5.3)
Sodium: 141 mmol/L (ref 135–146)

## 2015-05-19 LAB — CBC WITH DIFFERENTIAL/PLATELET
BASOS ABS: 0.1 10*3/uL (ref 0.0–0.1)
Basophils Relative: 1 % (ref 0–1)
EOS PCT: 3 % (ref 0–5)
Eosinophils Absolute: 0.2 10*3/uL (ref 0.0–0.7)
HCT: 43.4 % (ref 39.0–52.0)
Hemoglobin: 15.2 g/dL (ref 13.0–17.0)
LYMPHS ABS: 2 10*3/uL (ref 0.7–4.0)
LYMPHS PCT: 27 % (ref 12–46)
MCH: 29 pg (ref 26.0–34.0)
MCHC: 35 g/dL (ref 30.0–36.0)
MCV: 82.7 fL (ref 78.0–100.0)
MPV: 10.2 fL (ref 8.6–12.4)
Monocytes Absolute: 0.5 10*3/uL (ref 0.1–1.0)
Monocytes Relative: 7 % (ref 3–12)
NEUTROS PCT: 62 % (ref 43–77)
Neutro Abs: 4.7 10*3/uL (ref 1.7–7.7)
Platelets: 242 10*3/uL (ref 150–400)
RBC: 5.25 MIL/uL (ref 4.22–5.81)
RDW: 13.5 % (ref 11.5–15.5)
WBC: 7.5 10*3/uL (ref 4.0–10.5)

## 2015-05-19 LAB — IRON AND TIBC
%SAT: 35 % (ref 15–60)
IRON: 104 ug/dL (ref 50–180)
TIBC: 297 ug/dL (ref 250–425)
UIBC: 193 ug/dL (ref 125–400)

## 2015-05-19 LAB — HEPATIC FUNCTION PANEL
ALT: 22 U/L (ref 9–46)
AST: 24 U/L (ref 10–35)
Albumin: 4.2 g/dL (ref 3.6–5.1)
Alkaline Phosphatase: 96 U/L (ref 40–115)
BILIRUBIN TOTAL: 1 mg/dL (ref 0.2–1.2)
Bilirubin, Direct: 0.1 mg/dL (ref <=0.2)
Indirect Bilirubin: 0.9 mg/dL (ref 0.2–1.2)
Total Protein: 6.8 g/dL (ref 6.1–8.1)

## 2015-05-19 LAB — LIPID PANEL
CHOLESTEROL: 216 mg/dL — AB (ref 125–200)
HDL: 35 mg/dL — AB (ref >=40)
LDL Cholesterol: 150 mg/dL — ABNORMAL HIGH (ref <130)
TRIGLYCERIDES: 154 mg/dL — AB (ref <150)
Total CHOL/HDL Ratio: 6.2 Ratio — ABNORMAL HIGH (ref <=5.0)
VLDL: 31 mg/dL — ABNORMAL HIGH (ref <30)

## 2015-05-19 LAB — HEMOGLOBIN A1C
Hgb A1c MFr Bld: 6.5 % — ABNORMAL HIGH (ref <5.7)
Mean Plasma Glucose: 140 mg/dL — ABNORMAL HIGH (ref <117)

## 2015-05-19 LAB — MAGNESIUM: MAGNESIUM: 1.9 mg/dL (ref 1.5–2.5)

## 2015-05-19 LAB — VITAMIN B12: VITAMIN B 12: 332 pg/mL (ref 211–911)

## 2015-05-19 LAB — TSH: TSH: 3.892 u[IU]/mL (ref 0.350–4.500)

## 2015-05-19 MED ORDER — BISOPROLOL-HYDROCHLOROTHIAZIDE 5-6.25 MG PO TABS: ORAL_TABLET | ORAL | Status: DC

## 2015-05-19 MED ORDER — VITAMIN D3 250 MCG (10000 UT) PO CAPS: 10000.0000 [IU] | ORAL_CAPSULE | Freq: Every day | ORAL | Status: DC

## 2015-05-19 MED ORDER — ALPRAZOLAM 0.5 MG PO TABS: ORAL_TABLET | ORAL | Status: DC

## 2015-05-19 MED ORDER — BENAZEPRIL HCL 20 MG PO TABS
ORAL_TABLET | ORAL | Status: DC
Start: 2015-05-19 — End: 2015-06-17

## 2015-05-19 NOTE — Progress Notes (Signed)
Patient ID: Gabriel Hoover, male   DOB: Feb 17, 1961, 54 y.o.   MRN: [id number]   Wellness Visit and Comprehensive Evaluation, Examination & Management  This very nice 54 y.o. MWM presents for  presents for a Wellness Visit & comprehensive evaluation and management of multiple medical co-morbidities.  Patient has been followed for HTN, Prediabetes, Hyperlipidemia, Testosterone and Vitamin D Deficiency. Patient also endorse an intense phobia to venipuncture since an episode of an injection many years ago and the needle alleged broke off in his arm and had to be "dug" out. Thus he has asked for a sedative (alprazolam) to be taken before OVs.    HTN predates since 2008. Patient's BP has not been controlled at home as he admits non-compliance and has stopped his medications.Today's BP is 150/100. Patient denies any cardiac symptoms as chest pain, palpitations, shortness of breath, dizziness or ankle swelling.   Patient's hyperlipidemia is not controlled with diet and  As patient is recalcitrant to taking medications.  Last lipids were not at goal with Total Chol 200, TG 246, HDL 35 and LDL 116 in Oct 2015.    Patient has preDiabetes since 2013 with  A1c 5.8%, and then 6.2% in 2014.  Patient denies reactive hypoglycemic symptoms, visual blurring, diabetic polys or paresthesias. Last A1c was 6.5% in T2_DM range in Oct 2015.      Patient also has low Testosterone and level was 229 in Oct 2015 and as above has declined to take medications. Finally, patient has history of Vitamin D Deficiency of 23 in 2013 and last vitamin D was 48 in Oct 2015.      Medication Sig  . ALPRAZolam (XANAX) 0.5 MG tablet Take 0.5 mg by mouth. Take one to two tablets one half hour to one hour prior to lab work  . amLODipine (NORVASC) 5 MG tablet TAKE 1 TABLET BY MOUTH EVERY DAY  . losartan-hydrochlorothiazide (HYZAAR) 100-25 MG per tablet TAKE 1 TABLET BY MOUTH EVERY DAY  . VITAMIN D, ERGOCALCIFEROL, PO Take 5,000 Units by mouth  daily.   Allergies  Allergen Reactions  . Atenolol    Past Medical History  Diagnosis Date  . Anxiety   . Migraines   . Prediabetes   . Vitamin D deficiency   . Hyperlipidemia   . Hypertension    Health Maintenance  Topic Date Due  . TETANUS/TDAP  06/10/1980  . COLONOSCOPY  06/11/2011  . INFLUENZA VACCINE  03/14/2015  . Hepatitis C Screening  Completed  . HIV Screening  Completed   No past surgical history  Family History  Problem Relation Age of Onset  . Hyperlipidemia Father   . Heart disease Father    Social History   Social History  . Marital Status: Married    Spouse Name: N/A  . Number of Children: N/A  . Years of Education: N/A   Occupational History  . Minister   Social History Main Topics  . Smoking status: Never Smoker   . Smokeless tobacco: Never Used  . Alcohol Use: No  . Drug Use: No  . Sexual Activity: Active    ROS Constitutional: Denies fever, chills, weight loss/gain, headaches, insomnia,  night sweats or change in appetite. Does c/o fatigue. Eyes: Denies redness, blurred vision, diplopia, discharge, itchy or watery eyes.  ENT: Denies discharge, congestion, post nasal drip, epistaxis, sore throat, earache, hearing loss, dental pain, Tinnitus, Vertigo, Sinus pain or snoring.  Cardio: Denies chest pain, palpitations, irregular heartbeat, syncope, dyspnea, diaphoresis, orthopnea, PND, claudication or  edema Respiratory: denies cough, dyspnea, DOE, pleurisy, hoarseness, laryngitis or wheezing.  Gastrointestinal: Denies dysphagia, heartburn, reflux, water brash, pain, cramps, nausea, vomiting, bloating, diarrhea, constipation, hematemesis, melena, hematochezia, jaundice or hemorrhoids Genitourinary: Denies dysuria, frequency, urgency, nocturia, hesitancy, discharge, hematuria or flank pain Musculoskeletal: Denies arthralgia, myalgia, stiffness, Jt. Swelling, pain, limp or strain/sprain. Denies Falls. Skin: Denies puritis, rash, hives, warts, acne,  eczema or change in skin lesion Neuro: No weakness, tremor, incoordination, spasms, paresthesia or pain Psychiatric: Denies confusion, memory loss or sensory loss. Denies Depression. Endocrine: Denies change in weight, skin, hair change, nocturia, and paresthesia, diabetic polys, visual blurring or hyper / hypo glycemic episodes.  Heme/Lymph: No excessive bleeding, bruising or enlarged lymph nodes.  Physical Exam  BP 150/100 mmHg  Pulse 84  Temp(Src) 97.9 F (36.6 C) (Temporal)  Resp 14  Ht 5\' 9"  (1.753 m)  Wt 188 lb (85.276 kg)  BMI 27.75 kg/m2  General Appearance: Well nourished &  in no apparent distress. Eyes: PERRLA, EOMs, conjunctiva no swelling or erythema, normal fundi and vessels. Sinuses: No frontal/maxillary tenderness ENT/Mouth: EACs patent / TMs  nl. Nares clear without erythema, swelling, mucoid exudates. Oral hygiene is good. No erythema, swelling, or exudate. Tongue normal, non-obstructing. Tonsils not swollen or erythematous. Hearing normal.  Neck: Supple, thyroid normal. No bruits, nodes or JVD. Respiratory: Respiratory effort normal.  BS equal and clear bilateral without rales, rhonci, wheezing or stridor. Cardio: Heart sounds are normal with regular rate and rhythm and no murmurs, rubs or gallops. Peripheral pulses are normal and equal bilaterally without edema. No aortic or femoral bruits. Chest: symmetric with normal excursions and percussion.  Abdomen: Flat, soft, with bowel sounds. Nontender, no guarding, rebound, hernias, masses, or organomegaly.  Lymphatics: Non tender without lymphadenopathy.  Genitourinary: No hernias.Testes nl. DRE - prostate nl for age - smooth & firm w/o nodules. Musculoskeletal: Full ROM all peripheral extremities, joint stability, 5/5 strength, and normal gait. Skin: Warm and dry without rashes, lesions, cyanosis, clubbing or  ecchymosis.  Neuro: Cranial nerves intact, reflexes equal bilaterally. Normal muscle tone, no cerebellar  symptoms. Sensation intact.  Pysch: Alert and oriented X 3 with normal affect, insight and judgment appropriate.   Assessment and Plan  1. Encounter for general adult medical examination with abnormal findings  - Microalbumin / creatinine urine ratio - EKG 12-Lead - [place], RETROPERITNL ABD,  LTD - POC Hemoccult Bld/Stl (3-Cd Home Screen); Future - Vitamin B12 - PSA - Testosterone - Iron and TIBC - Urinalysis, Routine w reflex microscopic (not at [facility]) - CBC with Differential/Platelet - BASIC METABOLIC PANEL WITH GFR - Hepatic function panel - Magnesium - Lipid panel - TSH - Hemoglobin A1c - Insulin, random - Vit D  25 hydroxy (rtn osteoporosis monitoring) - ALPRAZolam (XANAX) 0.5 MG tablet; Take one to two tablets one one hour prior to lab work  Dispense: 30 tablet; Refill: 0 - Cholecalciferol (VITAMIN D3) 10000 UNITS capsule; Take 1 capsule (10,000 Units total) by mouth daily. - bisoprolol-hydrochlorothiazide (ZIAC) 5-6.25 MG tablet; Take 1 tablet every morning for BP  Dispense: 90 tablet; Refill: 1 - benazepril (LOTENSIN) 20 MG tablet; Take 1 tablet at night fore BP  Dispense: 90 tablet; Refill: 1  2. Essential hypertension  - Microalbumin / creatinine urine ratio - EKG 12-Lead - [place], RETROPERITNL ABD,  LTD - TSH - bisoprolol-hydrochlorothiazide (ZIAC) 5-6.25 MG tablet; Take 1 tablet every morning for BP  Dispense: 90 tablet; Refill: 1 - benazepril (LOTENSIN) 20 MG tablet; Take 1 tablet at night  fore BP  Dispense: 90 tablet; Refill: 1 - ROV 1 month  3. Hyperlipidemia  - Lipid panel  4. Prediabetes  - Hemoglobin A1c - Insulin, random  5. Vitamin D deficiency  - Vit D  25 hydroxy - Cholecalciferol (VITAMIN D3) 10000 UNITS capsule  6. Screening for rectal cancer  - POC Hemoccult Bld/Stl  7. Prostate cancer screening  - PSA  8. Other fatigue  - Vitamin B12 - Testosterone - Iron and TIBC - TSH  9. Testosterone deficiency  - Testosterone  10. BMI  28.57,adult   11. Anxiety state  - ALPRAZolam (XANAX) 0.5 MG tablet; Take one to two tablets one one hour prior to lab work  Dispense: 30 tablet; Refill: 0  12. Medication management  - Urinalysis, Routine w reflex microscopic (not at [facility]) - CBC with Differential/Platelet - BASIC METABOLIC PANEL WITH GFR - Hepatic function panel - Magnesium   Continue prudent diet as discussed, weight control, BP monitoring, regular exercise, and medications as discussed.  Discussed med effects and SE's. Routine screening labs and tests as requested with regular follow-up as recommended.  Over 40 minutes of exam, counseling &  chart review was performed

## 2015-05-19 NOTE — Patient Instructions (Signed)

## 2015-05-20 LAB — URINALYSIS, ROUTINE W REFLEX MICROSCOPIC
Bilirubin Urine: NEGATIVE
GLUCOSE, UA: NEGATIVE
HGB URINE DIPSTICK: NEGATIVE
KETONES UR: NEGATIVE
Leukocytes, UA: NEGATIVE
Nitrite: NEGATIVE
PH: 6 (ref 5.0–8.0)
Protein, ur: NEGATIVE
SPECIFIC GRAVITY, URINE: 1.019 (ref 1.001–1.035)

## 2015-05-20 LAB — PSA: PSA: 0.86 ng/mL (ref <=4.00)

## 2015-05-20 LAB — TESTOSTERONE: TESTOSTERONE: 311 ng/dL (ref 300–890)

## 2015-05-20 LAB — VITAMIN D 25 HYDROXY (VIT D DEFICIENCY, FRACTURES): VIT D 25 HYDROXY: 25 ng/mL — AB (ref 30–100)

## 2015-05-20 LAB — MICROALBUMIN / CREATININE URINE RATIO
Creatinine, Urine: 160.9 mg/dL
Microalb Creat Ratio: 25.5 mg/g (ref 0.0–30.0)
Microalb, Ur: 4.1 mg/dL — ABNORMAL HIGH (ref <2.0)

## 2015-05-20 LAB — INSULIN, RANDOM: INSULIN: 3.4 u[IU]/mL (ref 2.0–19.6)

## 2015-06-17 ENCOUNTER — Encounter: Payer: Self-pay | Admitting: Internal Medicine

## 2015-06-17 ENCOUNTER — Ambulatory Visit (INDEPENDENT_AMBULATORY_CARE_PROVIDER_SITE_OTHER): Payer: 59 | Admitting: Internal Medicine

## 2015-06-17 VITALS — BP 176/122 | HR 52 | Temp 97.7°F | Resp 16 | Ht 69.0 in | Wt 192.6 lb

## 2015-06-17 DIAGNOSIS — E559 Vitamin D deficiency, unspecified: Secondary | ICD-10-CM

## 2015-06-17 DIAGNOSIS — E119 Type 2 diabetes mellitus without complications: Secondary | ICD-10-CM | POA: Diagnosis not present

## 2015-06-17 DIAGNOSIS — Z9114 Patient's other noncompliance with medication regimen: Secondary | ICD-10-CM | POA: Diagnosis not present

## 2015-06-17 DIAGNOSIS — E785 Hyperlipidemia, unspecified: Secondary | ICD-10-CM

## 2015-06-17 DIAGNOSIS — I10 Essential (primary) hypertension: Secondary | ICD-10-CM

## 2015-06-17 MED ORDER — BENAZEPRIL HCL 40 MG PO TABS: ORAL_TABLET | ORAL | Status: DC

## 2015-06-17 MED ORDER — BISOPROLOL-HYDROCHLOROTHIAZIDE 10-6.25 MG PO TABS: ORAL_TABLET | ORAL | Status: DC

## 2015-06-17 NOTE — Patient Instructions (Signed)
Today your BP is 185/124 !!!!  Dangerously high !!!!  ++++++++++++++++++++++++++++++  Increase your Lotensin 20 mg to 2 tablets = 40 mg at nite  (New Rx for 40 mg sent to drugstore)  +++++++++++++++++++++++++++++++   Increase your Ziac 5 (Bisoprolol 5 ) to 2  tablets every morning   (New Rx sent to Drugstore for Ziac 10 )

## 2015-06-17 NOTE — Progress Notes (Deleted)
Patient ID: Gabriel Hoover, male   DOB: [birth year], 54 y.o.   MRN: [id number]

## 2015-06-17 NOTE — Progress Notes (Signed)
  Subjective:    Patient ID: Gabriel Hoover, male    DOB: March 26, 1961, 53 y.o.   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 BP was 176/122 by the nurse and 185/125 by myself. Patient also had a very low Vit D level of 25 and lipids were very high and not at goal with with Total Chol  216, HDL 35, Trig 154,  LDL 150 on Oct 6,2016. Patient's A1c was 6.5% now meeting the criteria of T2_NIDDM and patient is confident that with better diet that he can lose weight and cure his diabetes.  Medication Sig  . ALPRAZolam (XANAX) 0.5 MG tablet Take one to two tablets one one hour prior to lab work  . VITAMIN D 10,000 UNITS capsule Take 1 capsule (10,000 Units total) by mouth daily.  . benazepril (LOTENSIN) 20 MG tablet Take 1 tablet at night fore BP  . bisoprolol-hctz (ZIAC) 5-6.25 MG tablet Take 1 tablet every morning for BP   Allergies  Allergen Reactions  . Atenolol    Past Medical History  Diagnosis Date  . Anxiety   . Migraines   . Prediabetes   . Vitamin D deficiency   . Hyperlipidemia   . Hypertension    Review of Systems 10 point systems review negative except as above.    Objective:   Physical Exam  BP 176/122 mmHg  Pulse 52  Temp(Src) 97.7 F (36.5 C)  Resp 16  Ht 5\' 9"  (1.753 m)  Wt 192 lb 9.6 oz (87.363 kg)  BMI 28.43 kg/m2 BP recheck was 185/125  In No Acute Distress.  Skin - Clear  HEENT - Eac's patent. TM's Nl. EOM's full. PERRLA. NasoOroPharynx clear. Neck - supple. Nl Thyroid. Carotids 2+ & No bruits, nodes, JVD Chest - Clear equal BS w/o Rales, rhonchi, wheezes. Cor - Nl HS. RRR w/o sig MGR. PP 1(+). No edema. MS- FROM w/o deformities. Muscle power, tone and bulk Nl. Gait Nl. Neuro - No obvious Cr N abnormalities. Sensory, motor and Cerebellar functions appear  Nl w/o focal abnormalities. Psyche - Mental status normal & appropriate.  No delusions, ideations or obvious mood abnormalities.    Assessment & Plan:   1. Essential hypertension  - benazepril (LOTENSIN) 40 MG tablet; Take 1 tablet at night for BP  Dispense: 90 tablet; Refill: 1 - bisoprolol-hydrochlorothiazide (ZIAC) 10-6.25 MG tablet; Take 1 tablet every morning for BP  Dispense: 90 tablet; Refill: 1  2. Hyperlipidemia  - patient adamantly feel he cam improve his lipids with stricter diet  3. T2_NIDDM  - Attempt diet to weight loss at this time.   4. Vitamin D deficiency   5. Poor compliance with medication  - Long discussion with patient regarding negative outcomes of ignoring his multiple co-morbid health issues.   - Discussed meds/SE's. ROV 1 month for recheck & labs.

## 2015-07-18 ENCOUNTER — Encounter: Payer: Self-pay | Admitting: Internal Medicine

## 2015-07-18 ENCOUNTER — Ambulatory Visit (INDEPENDENT_AMBULATORY_CARE_PROVIDER_SITE_OTHER): Payer: 59 | Admitting: Internal Medicine

## 2015-07-18 VITALS — BP 160/100 | HR 52 | Temp 97.5°F | Resp 16 | Ht 69.0 in | Wt 192.4 lb

## 2015-07-18 DIAGNOSIS — E785 Hyperlipidemia, unspecified: Secondary | ICD-10-CM

## 2015-07-18 DIAGNOSIS — E119 Type 2 diabetes mellitus without complications: Secondary | ICD-10-CM

## 2015-07-18 DIAGNOSIS — I10 Essential (primary) hypertension: Secondary | ICD-10-CM

## 2015-07-18 DIAGNOSIS — Z6828 Body mass index (BMI) 28.0-28.9, adult: Secondary | ICD-10-CM | POA: Diagnosis not present

## 2015-07-18 DIAGNOSIS — Z79899 Other long term (current) drug therapy: Secondary | ICD-10-CM | POA: Diagnosis not present

## 2015-07-18 LAB — LIPID PANEL
CHOLESTEROL: 203 mg/dL — AB (ref 125–200)
HDL: 33 mg/dL — ABNORMAL LOW (ref >=40)
LDL Cholesterol: 119 mg/dL (ref <130)
Total CHOL/HDL Ratio: 6.2 Ratio — ABNORMAL HIGH (ref <=5.0)
Triglycerides: 254 mg/dL — ABNORMAL HIGH (ref <150)
VLDL: 51 mg/dL — ABNORMAL HIGH (ref <30)

## 2015-07-18 MED ORDER — MINOXIDIL 10 MG PO TABS: ORAL_TABLET | ORAL | Status: DC
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
## 2015-08-18 ENCOUNTER — Ambulatory Visit: Payer: Self-pay | Admitting: Physician Assistant

## 2016-06-14 ENCOUNTER — Encounter: Payer: Self-pay | Admitting: Internal Medicine

## 2017-07-16 ENCOUNTER — Encounter: Payer: Self-pay | Admitting: Internal Medicine
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
## 2018-03-26 ENCOUNTER — Ambulatory Visit (INDEPENDENT_AMBULATORY_CARE_PROVIDER_SITE_OTHER): Payer: Self-pay | Admitting: Adult Health

## 2018-03-26 ENCOUNTER — Encounter: Payer: Self-pay | Admitting: Adult Health

## 2018-03-26 ENCOUNTER — Other Ambulatory Visit: Payer: Self-pay | Admitting: Adult Health

## 2018-03-26 VITALS — BP 190/110 | HR 91 | Temp 97.7°F | Wt 195.0 lb

## 2018-03-26 DIAGNOSIS — Z79899 Other long term (current) drug therapy: Secondary | ICD-10-CM

## 2018-03-26 DIAGNOSIS — E119 Type 2 diabetes mellitus without complications: Secondary | ICD-10-CM

## 2018-03-26 DIAGNOSIS — I10 Essential (primary) hypertension: Secondary | ICD-10-CM

## 2018-03-26 DIAGNOSIS — Z0001 Encounter for general adult medical examination with abnormal findings: Secondary | ICD-10-CM

## 2018-03-26 DIAGNOSIS — E785 Hyperlipidemia, unspecified: Secondary | ICD-10-CM

## 2018-03-26 DIAGNOSIS — E559 Vitamin D deficiency, unspecified: Secondary | ICD-10-CM

## 2018-03-26 DIAGNOSIS — E663 Overweight: Secondary | ICD-10-CM

## 2018-03-26 DIAGNOSIS — F411 Generalized anxiety disorder: Secondary | ICD-10-CM

## 2018-03-26 MED ORDER — ALPRAZOLAM 0.5 MG PO TABS: ORAL_TABLET | ORAL | 0 refills | Status: DC

## 2018-03-26 NOTE — Progress Notes (Signed)
Patient lost to follow up presenting this afternoon for follow up OV; has known severe phobia of needles and medical interactions - wife called to express concern. Will send in 2 tabs of xanax to take 1 hour prior to visit. Will discuss ongoing plan later today at length with patient.

## 2018-03-26 NOTE — Patient Instructions (Signed)
Monitor your blood pressure at home, please keep a record and bring that in with you to your next office visit.   Go to the ER if any CP, SOB, nausea, dizziness, severe HA, changes vision/speech  Due to a recent study, SPRINT, we have changed our goal for the systolic or top blood pressure number. Ideally we want your top number at 120.  In the [facility] Trial, 5000 people were randomized to a goal BP of 120 and 5000 people were randomized to a goal BP of less than 140. The patients with the goal BP at 120 had LESS DEMENTIA, LESS HEART ATTACKS, AND LESS STROKES, AS WELL AS OVERALL DECREASED MORTALITY OR DEATH RATE.   If you are willing, our goal BP is the top number of 120.  Your most recent BP: BP: (!) 190/110   Take your medications faithfully as instructed. Maintain a healthy weight. Get at least 150 minutes of aerobic exercise per week. Minimize salt intake. Minimize alcohol intake  DASH Eating Plan DASH stands for "Dietary Approaches to Stop Hypertension." The DASH eating plan is a healthy eating plan that has been shown to reduce high blood pressure (hypertension). Additional health benefits may include reducing the risk of type 2 diabetes mellitus, heart disease, and stroke. The DASH eating plan may also help with weight loss. WHAT DO I NEED TO KNOW ABOUT THE DASH EATING PLAN? For the DASH eating plan, you will follow these general guidelines:  Choose foods with a percent daily value for sodium of less than 5% (as listed on the food label).  Use salt-free seasonings or herbs instead of table salt or sea salt.  Check with your health care provider or pharmacist before using salt substitutes.  Eat lower-sodium products, often labeled as "lower sodium" or "no salt added."  Eat fresh foods.  Eat more vegetables, fruits, and low-fat dairy products.  Choose whole grains. Look for the word "whole" as the first word in the ingredient list.  Choose fish and skinless chicken or [place]  more often than red meat. Limit fish, poultry, and meat to 6 oz (170 g) each day.  Limit sweets, desserts, sugars, and sugary drinks.  Choose heart-healthy fats.  Limit cheese to 1 oz (28 g) per day.  Eat more home-cooked food and less restaurant, buffet, and fast food.  Limit fried foods.  Cook foods using methods other than frying.  Limit canned vegetables. If you do use them, rinse them well to decrease the sodium.  When eating at a restaurant, ask that your food be prepared with less salt, or no salt if possible. WHAT FOODS CAN I EAT? Seek help from a dietitian for individual calorie needs. Grains Whole grain or whole wheat bread. Brown rice. Whole grain or whole wheat pasta. Quinoa, bulgur, and whole grain cereals. Low-sodium cereals. Corn or whole wheat flour tortillas. Whole grain cornbread. Whole grain crackers. Low-sodium crackers. Vegetables Fresh or frozen vegetables (raw, steamed, roasted, or grilled). Low-sodium or reduced-sodium tomato and vegetable juices. Low-sodium or reduced-sodium tomato sauce and paste. Low-sodium or reduced-sodium canned vegetables.  Fruits All fresh, canned (in natural juice), or frozen fruits. Meat and Other Protein Products Ground beef (85% or leaner), grass-fed beef, or beef trimmed of fat. Skinless chicken or [place]. Ground chicken or [place]. Pork trimmed of fat. All fish and seafood. Eggs. Dried beans, peas, or lentils. Unsalted nuts and seeds. Unsalted canned beans. Dairy Low-fat dairy products, such as skim or 1% milk, 2% or reduced-fat cheeses, low-fat ricotta  or cottage cheese, or plain low-fat yogurt. Low-sodium or reduced-sodium cheeses. Fats and Oils Tub margarines without trans fats. Light or reduced-fat mayonnaise and salad dressings (reduced sodium). Avocado. Safflower, olive, or canola oils. Natural peanut or almond butter. Other Unsalted popcorn and pretzels. The items listed above may not be a complete list of recommended foods  or beverages. Contact your dietitian for more options. WHAT FOODS ARE NOT RECOMMENDED? Grains White bread. White pasta. White rice. Refined cornbread. Bagels and croissants. Crackers that contain trans fat. Vegetables Creamed or fried vegetables. Vegetables in a cheese sauce. Regular canned vegetables. Regular canned tomato sauce and paste. Regular tomato and vegetable juices. Fruits Dried fruits. Canned fruit in light or heavy syrup. Fruit juice. Meat and Other Protein Products Fatty cuts of meat. Ribs, chicken wings, bacon, sausage, bologna, salami, chitterlings, fatback, hot dogs, bratwurst, and packaged luncheon meats. Salted nuts and seeds. Canned beans with salt. Dairy Whole or 2% milk, cream, half-and-half, and cream cheese. Whole-fat or sweetened yogurt. Full-fat cheeses or blue cheese. Nondairy creamers and whipped toppings. Processed cheese, cheese spreads, or cheese curds. Condiments Onion and garlic salt, seasoned salt, table salt, and sea salt. Canned and packaged gravies. Worcestershire sauce. Tartar sauce. Barbecue sauce. Teriyaki sauce. Soy sauce, including reduced sodium. Steak sauce. Fish sauce. Oyster sauce. Cocktail sauce. Horseradish. Ketchup and mustard. Meat flavorings and tenderizers. Bouillon cubes. Hot sauce. Tabasco sauce. Marinades. Taco seasonings. Relishes. Fats and Oils Butter, stick margarine, lard, shortening, ghee, and bacon fat. Coconut, palm kernel, or palm oils. Regular salad dressings. Other Pickles and olives. Salted popcorn and pretzels. The items listed above may not be a complete list of foods and beverages to avoid. Contact your dietitian for more information. WHERE CAN I FIND MORE INFORMATION? National [employer], [employer], [employer]: travelstabloid.com Document Released: 07/19/2011 Document Revised: 12/14/2013 Document Reviewed: 06/03/2013 [facility] Patient Information 2015 [place], [place]. This information is not  intended to replace advice given to you by your health care provider. Make sure you discuss any questions you have with your health care provider.

## 2018-04-02 ENCOUNTER — Ambulatory Visit (INDEPENDENT_AMBULATORY_CARE_PROVIDER_SITE_OTHER): Payer: Self-pay

## 2018-04-02 ENCOUNTER — Other Ambulatory Visit: Payer: Self-pay | Admitting: Adult Health

## 2018-04-02 DIAGNOSIS — E119 Type 2 diabetes mellitus without complications: Secondary | ICD-10-CM

## 2018-04-02 DIAGNOSIS — E785 Hyperlipidemia, unspecified: Secondary | ICD-10-CM

## 2018-04-02 DIAGNOSIS — I10 Essential (primary) hypertension: Secondary | ICD-10-CM

## 2018-04-02 DIAGNOSIS — Z79899 Other long term (current) drug therapy: Secondary | ICD-10-CM

## 2018-04-02 NOTE — Progress Notes (Signed)
Patient presents to the office for a nurse visit to have BP checked and lab work done. Today's blood pressure reading was 198/120.

## 2018-04-03 LAB — CBC WITH DIFFERENTIAL/PLATELET
BASOS ABS: 114 {cells}/uL (ref 0–200)
Basophils Relative: 1.3 %
Eosinophils Absolute: 264 cells/uL (ref 15–500)
Eosinophils Relative: 3 %
HEMATOCRIT: 43.7 % (ref 38.5–50.0)
HEMOGLOBIN: 14.9 g/dL (ref 13.2–17.1)
Lymphs Abs: 2112 cells/uL (ref 850–3900)
MCH: 28.9 pg (ref 27.0–33.0)
MCHC: 34.1 g/dL (ref 32.0–36.0)
MCV: 84.7 fL (ref 80.0–100.0)
MPV: 11.1 fL (ref 7.5–12.5)
Monocytes Relative: 8 %
NEUTROS ABS: 5606 {cells}/uL (ref 1500–7800)
Neutrophils Relative %: 63.7 %
Platelets: 270 10*3/uL (ref 140–400)
RBC: 5.16 10*6/uL (ref 4.20–5.80)
RDW: 13 % (ref 11.0–15.0)
Total Lymphocyte: 24 %
WBC mixed population: 704 cells/uL (ref 200–950)
WBC: 8.8 10*3/uL (ref 3.8–10.8)

## 2018-04-03 LAB — COMPLETE METABOLIC PANEL WITH GFR
AG RATIO: 1.6 (calc) (ref 1.0–2.5)
ALBUMIN MSPROF: 4.4 g/dL (ref 3.6–5.1)
ALT: 19 U/L (ref 9–46)
AST: 19 U/L (ref 10–35)
Alkaline phosphatase (APISO): 113 U/L (ref 40–115)
BUN: 15 mg/dL (ref 7–25)
CALCIUM: 9.4 mg/dL (ref 8.6–10.3)
CO2: 29 mmol/L (ref 20–32)
Chloride: 101 mmol/L (ref 98–110)
Creat: 1.28 mg/dL (ref 0.70–1.33)
GFR, EST AFRICAN AMERICAN: 72 mL/min/{1.73_m2} (ref >=60)
GFR, EST NON AFRICAN AMERICAN: 62 mL/min/{1.73_m2} (ref >=60)
Globulin: 2.8 g/dL (calc) (ref 1.9–3.7)
Glucose, Bld: 303 mg/dL — ABNORMAL HIGH (ref 65–99)
POTASSIUM: 4.2 mmol/L (ref 3.5–5.3)
Sodium: 139 mmol/L (ref 135–146)
TOTAL PROTEIN: 7.2 g/dL (ref 6.1–8.1)
Total Bilirubin: 1 mg/dL (ref 0.2–1.2)

## 2018-04-03 LAB — LIPID PANEL
Cholesterol: 216 mg/dL — ABNORMAL HIGH (ref <200)
HDL: 44 mg/dL (ref >40)
LDL Cholesterol (Calc): 138 mg/dL (calc) — ABNORMAL HIGH
NON-HDL CHOLESTEROL (CALC): 172 mg/dL — AB (ref <130)
Total CHOL/HDL Ratio: 4.9 (calc) (ref <5.0)
Triglycerides: 207 mg/dL — ABNORMAL HIGH (ref <150)

## 2018-04-03 LAB — HEMOGLOBIN A1C
EAG (MMOL/L): 11.6 (calc)
HEMOGLOBIN A1C: 8.9 %{Hb} — AB (ref <5.7)
MEAN PLASMA GLUCOSE: 209 (calc)

## 2018-04-03 LAB — TSH: TSH: 3.55 m[IU]/L (ref 0.40–4.50)

## 2018-04-04 ENCOUNTER — Other Ambulatory Visit: Payer: Self-pay | Admitting: Adult Health

## 2018-04-04 DIAGNOSIS — F411 Generalized anxiety disorder: Secondary | ICD-10-CM

## 2018-04-04 DIAGNOSIS — Z0001 Encounter for general adult medical examination with abnormal findings: Secondary | ICD-10-CM

## 2018-04-04 MED ORDER — ALPRAZOLAM 0.5 MG PO TABS: ORAL_TABLET | ORAL | 0 refills | Status: AC

## 2018-04-04 MED ORDER — LOSARTAN POTASSIUM-HCTZ 50-12.5 MG PO TABS: ORAL_TABLET | ORAL | 1 refills | Status: DC

## 2018-07-01 NOTE — Progress Notes (Signed)
FOLLOW UP  Assessment and Plan:   Hypertension titrate medications very slowly to minimize SE/weakness/dizziness/fatigue Switch to olmesartan/amlodipine/hctz, start with 1/2 tab x 1 month, then increase to full tab to reduce pill burden Monitor blood pressure at home; Continue DASH diet.  Advised to cut down on coffee  Reminder to go to the ER if any CP, SOB, nausea, dizziness, severe HA, changes vision/speech, left arm numbness and tingling and jaw pain.  Cholesterol Has never initiated medication, resistant to starting meds, defer for now to focus on glucose and htn Continue low cholesterol diet and exercise.  Check lipid panel.   Diabetes without complications Not currently on medications, declines to start, working on lifestyle Continue diet and exercise.  Perform daily foot/skin check, notify office of any concerning changes.  Check A1C  Overweight Long discussion about weight loss, diet, and exercise Recommended diet heavy in fruits and veggies and low in animal meats, cheeses, and dairy products, appropriate calorie intake Discussed ideal weight for height  Will follow up in 3 months  Vitamin D Def Check - not on supplement, newly has insurance  Patient will return after taking xanax for lab draw.   Continue diet and meds as discussed. Further disposition pending results of labs. Discussed med's effects and SE's.   Over 30 minutes of exam, counseling, chart review, and critical decision making was performed.   No future appointments. ----------------------------------------------------------------------------------------------------------------------  HPI 57 y.o. male  With severe needle phobia presents for follow up on hypertension, cholesterol, diabetes, weight and vitamin D deficiency. He newly has insurance coverage He is prescribed xanax to take prior to OVs in order to draw labs. Presviously very non-compliant; He has been advised if he is non-compliant with  labs/medications he will be dismissed from this practice.   He has severe phobia of needles due to poor experience as child, and reports feeling "terrible" on BP medications and feels fine off of medications. Remains very resistant and disgruntled today. Accompanied by wife.   BMI is Body mass index is 28.97 kg/m., he has not been working on diet, admits to eating out a lot during the day, eats diabetic friendly diet in the evening cooked by his wife typically walks several miles over the course of the day, is a [employer] manager. Drinks ~10 glasses of water daily, he is down to 2 cups of coffee daily. No alcohol or tobacco.  Wt Readings from Last 3 Encounters:  07/02/18 196 lb 3.2 oz (89 kg)  03/26/18 195 lb (88.5 kg)  07/18/15 192 lb 6.4 oz (87.3 kg)   His blood pressure has not been controlled at home (170/115, down from 190s/120s), today their BP is BP: (!) 202/130 He has been taking losartan 100, hctz 25 mg  He does not workout. He denies chest pain, shortness of breath, dizziness.   He is not on cholesterol medication. His cholesterol is not at goal. The cholesterol last visit was:   Lab Results  Component Value Date   CHOL 216 (H) 04/02/2018   HDL 44 04/02/2018   LDLCALC 138 (H) 04/02/2018   TRIG 207 (H) 04/02/2018   CHOLHDL 4.9 04/02/2018    He has not been working on diet and exercise for Type 2 diabetes, and denies foot ulcerations, increased appetite, nausea, paresthesia of the feet, polydipsia, polyuria, visual disturbances, vomiting and weight loss. Last A1C in the office was:  Lab Results  Component Value Date   HGBA1C 8.9 (H) 04/02/2018   Patient is not on Vitamin D supplement.  Lab Results  Component Value Date   VD25OH 25 (L) 05/19/2015       Current Medications:  Current Outpatient Medications on File Prior to Visit  Medication Sig  . ALPRAZolam (XANAX) 0.5 MG tablet Take one to two tablets one one hour prior to lab work   No current facility-administered medications  on file prior to visit.      Allergies:  Allergies  Allergen Reactions  . Atenolol      Medical History:  Past Medical History:  Diagnosis Date  . Anxiety   . Hyperlipidemia   . Hypertension   . Migraines   . Prediabetes   . Vitamin D deficiency    Family history- Reviewed and unchanged Social history- Reviewed and unchanged   Review of Systems:  Review of Systems  Constitutional: Negative for malaise/fatigue and weight loss.  HENT: Negative for hearing loss and tinnitus.   Eyes: Negative for blurred vision and double vision.  Respiratory: Negative for cough, shortness of breath and wheezing.   Cardiovascular: Negative for chest pain, palpitations, orthopnea, claudication and leg swelling.  Gastrointestinal: Negative for abdominal pain, blood in stool, constipation, diarrhea, heartburn, melena, nausea and vomiting.  Genitourinary: Negative.   Musculoskeletal: Negative for joint pain and myalgias.  Skin: Negative for rash.  Neurological: Negative for dizziness, tingling, sensory change, weakness and headaches.  Endo/Heme/Allergies: Negative for polydipsia.  Psychiatric/Behavioral: Negative.   All other systems reviewed and are negative.   Physical Exam: BP (!) 202/130   Pulse 72   Temp (!) 97.2 F (36.2 C)   Resp 16   Ht 5\' 9"  (1.753 m)   Wt 196 lb 3.2 oz (89 kg)   BMI 28.97 kg/m  Wt Readings from Last 3 Encounters:  07/02/18 196 lb 3.2 oz (89 kg)  03/26/18 195 lb (88.5 kg)  07/18/15 192 lb 6.4 oz (87.3 kg)   General Appearance: Well nourished, in no apparent distress. Eyes: PERRLA, EOMs, conjunctiva no swelling or erythema.  Sinuses: No Frontal/maxillary tenderness ENT/Mouth: Ext aud canals clear, TMs without erythema, bulging. No erythema, swelling, or exudate on post pharynx.  Tonsils not swollen or erythematous. Hearing normal.  Neck: Supple, thyroid normal.  Respiratory: Respiratory effort normal, BS equal bilaterally without rales, rhonchi, wheezing  or stridor.  Cardio: RRR with no MRGs. Brisk peripheral pulses without edema.  Abdomen: Soft, + BS.  Non tender, no guarding, rebound, hernias, masses. Lymphatics: Non tender without lymphadenopathy.  Musculoskeletal: Full ROM, 5/5 strength, Normal gait Skin: Warm, dry without rashes, lesions, ecchymosis.  Neuro: Cranial nerves intact. No cerebellar symptoms.  Psych: Awake and oriented X 3, normal affect, Insight and Judgment appropriate.    Izora Ribas, NP 10:25 AM [facility]

## 2018-07-02 ENCOUNTER — Ambulatory Visit (INDEPENDENT_AMBULATORY_CARE_PROVIDER_SITE_OTHER): Payer: 59 | Admitting: Adult Health

## 2018-07-02 ENCOUNTER — Encounter: Payer: Self-pay | Admitting: Adult Health

## 2018-07-02 VITALS — BP 192/115 | HR 72 | Temp 97.2°F | Resp 16 | Ht 69.0 in | Wt 196.2 lb

## 2018-07-02 DIAGNOSIS — E785 Hyperlipidemia, unspecified: Secondary | ICD-10-CM

## 2018-07-02 DIAGNOSIS — I10 Essential (primary) hypertension: Secondary | ICD-10-CM | POA: Diagnosis not present

## 2018-07-02 DIAGNOSIS — E119 Type 2 diabetes mellitus without complications: Secondary | ICD-10-CM | POA: Diagnosis not present

## 2018-07-02 DIAGNOSIS — Z9114 Patient's other noncompliance with medication regimen: Secondary | ICD-10-CM | POA: Diagnosis not present

## 2018-07-02 DIAGNOSIS — Z79899 Other long term (current) drug therapy: Secondary | ICD-10-CM

## 2018-07-02 DIAGNOSIS — E559 Vitamin D deficiency, unspecified: Secondary | ICD-10-CM

## 2018-07-02 DIAGNOSIS — E663 Overweight: Secondary | ICD-10-CM

## 2018-07-02 MED ORDER — OLMESARTAN-AMLODIPINE-HCTZ 40-10-25 MG PO TABS: ORAL_TABLET | ORAL | 2 refills | Status: DC

## 2018-07-02 NOTE — Patient Instructions (Signed)
Initial goal is to get your blood pressure <150/90  Stop old blood pressure medications, start new medication 1/2 tab once daily for 1 month, then increase to full tab (can try 3/4 tab if you can cut it that small in between for 2 weeks)   HYPERTENSION INFORMATION  Monitor your blood pressure at home, please keep a record and bring that in with you to your next office visit.   Go to the ER if any CP, SOB, nausea, dizziness, severe HA, changes vision/speech  Your most recent BP: BP: (!) 202/130   Take your medications faithfully as instructed. Maintain a healthy weight. Get at least 150 minutes of aerobic exercise per week. Minimize salt intake. Minimize alcohol intake  DASH Eating Plan DASH stands for "Dietary Approaches to Stop Hypertension." The DASH eating plan is a healthy eating plan that has been shown to reduce high blood pressure (hypertension). Additional health benefits may include reducing the risk of type 2 diabetes mellitus, heart disease, and stroke. The DASH eating plan may also help with weight loss. WHAT DO I NEED TO KNOW ABOUT THE DASH EATING PLAN? For the DASH eating plan, you will follow these general guidelines:  Choose foods with a percent daily value for sodium of less than 5% (as listed on the food label).  Use salt-free seasonings or herbs instead of table salt or sea salt.  Check with your health care provider or pharmacist before using salt substitutes.  Eat lower-sodium products, often labeled as "lower sodium" or "no salt added."  Eat fresh foods.  Eat more vegetables, fruits, and low-fat dairy products.  Choose whole grains. Look for the word "whole" as the first word in the ingredient list.  Choose fish and skinless chicken or [place] more often than red meat. Limit fish, poultry, and meat to 6 oz (170 g) each day.  Limit sweets, desserts, sugars, and sugary drinks.  Choose heart-healthy fats.  Limit cheese to 1 oz (28 g) per day.  Eat  more home-cooked food and less restaurant, buffet, and fast food.  Limit fried foods.  Cook foods using methods other than frying.  Limit canned vegetables. If you do use them, rinse them well to decrease the sodium.  When eating at a restaurant, ask that your food be prepared with less salt, or no salt if possible. WHAT FOODS CAN I EAT? Seek help from a dietitian for individual calorie needs. Grains Whole grain or whole wheat bread. Brown rice. Whole grain or whole wheat pasta. Quinoa, bulgur, and whole grain cereals. Low-sodium cereals. Corn or whole wheat flour tortillas. Whole grain cornbread. Whole grain crackers. Low-sodium crackers. Vegetables Fresh or frozen vegetables (raw, steamed, roasted, or grilled). Low-sodium or reduced-sodium tomato and vegetable juices. Low-sodium or reduced-sodium tomato sauce and paste. Low-sodium or reduced-sodium canned vegetables.  Fruits All fresh, canned (in natural juice), or frozen fruits. Meat and Other Protein Products Ground beef (85% or leaner), grass-fed beef, or beef trimmed of fat. Skinless chicken or [place]. Ground chicken or [place]. Pork trimmed of fat. All fish and seafood. Eggs. Dried beans, peas, or lentils. Unsalted nuts and seeds. Unsalted canned beans. Dairy Low-fat dairy products, such as skim or 1% milk, 2% or reduced-fat cheeses, low-fat ricotta or cottage cheese, or plain low-fat yogurt. Low-sodium or reduced-sodium cheeses. Fats and Oils Tub margarines without trans fats. Light or reduced-fat mayonnaise and salad dressings (reduced sodium). Avocado. Safflower, olive, or canola oils. Natural peanut or almond butter. Other Unsalted popcorn and pretzels. The items  listed above may not be a complete list of recommended foods or beverages. Contact your dietitian for more options. WHAT FOODS ARE NOT RECOMMENDED? Grains White bread. White pasta. White rice. Refined cornbread. Bagels and croissants. Crackers that contain trans  fat. Vegetables Creamed or fried vegetables. Vegetables in a cheese sauce. Regular canned vegetables. Regular canned tomato sauce and paste. Regular tomato and vegetable juices. Fruits Dried fruits. Canned fruit in light or heavy syrup. Fruit juice. Meat and Other Protein Products Fatty cuts of meat. Ribs, chicken wings, bacon, sausage, bologna, salami, chitterlings, fatback, hot dogs, bratwurst, and packaged luncheon meats. Salted nuts and seeds. Canned beans with salt. Dairy Whole or 2% milk, cream, half-and-half, and cream cheese. Whole-fat or sweetened yogurt. Full-fat cheeses or blue cheese. Nondairy creamers and whipped toppings. Processed cheese, cheese spreads, or cheese curds. Condiments Onion and garlic salt, seasoned salt, table salt, and sea salt. Canned and packaged gravies. Worcestershire sauce. Tartar sauce. Barbecue sauce. Teriyaki sauce. Soy sauce, including reduced sodium. Steak sauce. Fish sauce. Oyster sauce. Cocktail sauce. Horseradish. Ketchup and mustard. Meat flavorings and tenderizers. Bouillon cubes. Hot sauce. Tabasco sauce. Marinades. Taco seasonings. Relishes. Fats and Oils Butter, stick margarine, lard, shortening, ghee, and bacon fat. Coconut, palm kernel, or palm oils. Regular salad dressings. Other Pickles and olives. Salted popcorn and pretzels. The items listed above may not be a complete list of foods and beverages to avoid. Contact your dietitian for more information. WHERE CAN I FIND MORE INFORMATION? National Heart, [employer], and [employer] Institute: travelstabloid.com Document Released: 07/19/2011 Document Revised: 12/14/2013 Document Reviewed: 06/03/2013 [facility] Patient Information 2015 [place], [place]. This information is not intended to replace advice given to you by your health care provider. Make sure you discuss any questions you have with your health care provider.       Amlodipine; Hydrochlorothiazide; Olmesartan oral  tablets What is this medicine? AMLODIPINE; HYDROCHLOROTHIAZIDE, HCTZ; OLMESARTAN (am LOE di peen; hye droe klor oh THYE a zide; all mi SAR tan) is a combination of a calcium channel blocker, a diuretic, and an angiotensin II antagonist. This medicine is used to treat high blood pressure. This medicine may be used for other purposes; ask your health care provider or pharmacist if you have questions. COMMON BRAND NAME(S): Tribenzor What should I tell my health care provider before I take this medicine? They need to know if you have any of these conditions: -decreased urine -heart failure, recent heart attack, or other heart problems -if you are on a special diet, like a low salt diet -immune system problems, like lupus -kidney disease -liver disease -vomiting or diarrhea as this may cause dehydration -an unusual or allergic reaction to amlodipine, hydrochlorothiazide, sulfa drugs, olmesartan, other medicines, foods, dyes, or preservatives -pregnant or trying to get pregnant -breast-feeding How should I use this medicine? Take this medicine by mouth with a glass of water. Follow the directions on the prescription label. You can take it with or without food. If it upsets your stomach, take it with food. Take your medicine at regular intervals. Do not take it more often than directed. Do not stop taking except on your doctor's advice. Talk to your pediatrician regarding the use of this medicine in children. Special care may be needed. Patients over 43 years old may have a stronger reaction and need a smaller dose. Overdosage: If you think you have taken too much of this medicine contact a poison control center or emergency room at once. NOTE: This medicine is only for you. Do  not share this medicine with others. What if I miss a dose? If you miss a dose, take it as soon as you can. If it is almost time for your next dose, take only that dose. Do not take double or extra doses. What may interact  with this medicine? -alcohol -barbiturates like phenobarbital -diuretics like triamterene, spironolactone, or amiloride -lithium -medicines for blood pressure -medicines for diabetes -norepinephrine -NSAIDS, medicines for pain and inflammation, like ibuprofen or naproxen -potassium salts or potassium supplements -prescription pain medicines -skeletal muscle relaxants like tubocurarine -some cholesterol lowering medicines like cholestyramine or colestipol -steroid medicines like prednisone or cortisone This list may not describe all possible interactions. Give your health care provider a list of all the medicines, herbs, non-prescription drugs, or dietary supplements you use. Also tell them if you smoke, drink alcohol, or use illegal drugs. Some items may interact with your medicine. What should I watch for while using this medicine? Visit your doctor or health care professional for regular checks on your progress. Check your blood pressure as directed. Ask your doctor or health care professional what your blood pressure should be and when you should contact him or her. You must not get dehydrated. Ask your doctor or health care professional how much fluid you need to drink a day. Check with him or her if you get an attack of severe diarrhea, nausea and vomiting, or if you sweat a lot. The loss of too much body fluid can make it dangerous for you to take this medicine. Women should inform their doctor if they wish to become pregnant or think they might be pregnant. There is a potential for serious side effects to an unborn child. Talk to your health care professional or pharmacist for more information. You may get drowsy or dizzy. Do not drive, use machinery, or do anything that needs mental alertness until you know how this medicine affects you. Do not stand or sit up quickly, especially if you are an older patient. This reduces the risk of dizzy or fainting spells. Alcohol may interfere with the  effect of this medicine. Avoid alcoholic drinks. This medicine may affect your blood sugar level. If you have diabetes, check with your doctor or health care professional before changing the dose of your diabetic medicine. Avoid salt substitutes unless you are told otherwise by your doctor or health care professional. This medicine can make you more sensitive to the sun. Keep out of the sun. If you cannot avoid being in the sun, wear protective clothing and use sunscreen. Do not use sun lamps or tanning beds/booths. Do not treat yourself for coughs, colds, or pain while you are taking this medicine without asking your doctor or health care professional for advice. Some ingredients may increase your blood pressure. If you are going to have surgery or dialysis, tell your doctor or health care professional that you are taking this medicine. What side effects may I notice from receiving this medicine? Side effects that you should report to your doctor or health care professional as soon as possible: -allergic reactions like skin rash, itching or hives, swelling of the face, lips, or tongue -breathing problems -changes in vision -chest pain -confusion -dark urine -diarrhea -eye pain -fast, irregular heartbeat -feeling faint or lightheaded, falls -low blood pressure -muscle cramps -redness, blistering, peeling or loosening of the skin, including inside the mouth -stomach pain -swelling of the hands, ankles, or feet -trouble passing urine or change in the amount of urine -vomiting -weight loss -  worsened gout pain -yellowing of the eyes or skin Side effects that usually do not require medical attention (report to your doctor or health care professional if they continue or are bothersome): -change in sex drive or performance -dizziness -headache -muscle twitching -nausea -stuffy or runny nose and sore throat -swelling of the joints -weak or tired This list may not describe all possible  side effects. Call your doctor for medical advice about side effects. You may report side effects to FDA at 1-800-FDA-1088. Where should I keep my medicine? Keep out of the reach of children. Store at room temperature between 15 and 30 degrees C (59 and 86 degrees F). Protect from moisture. Throw away any unused medicine after the expiration date. NOTE: This sheet is a summary. It may not cover all possible information. If you have questions about this medicine, talk to your doctor, pharmacist, or health care provider.  2018 [employer] (2015-09-01 10:36:55)

## 2018-07-03 ENCOUNTER — Other Ambulatory Visit: Payer: Self-pay | Admitting: Adult Health

## 2018-07-03 LAB — CBC WITH DIFFERENTIAL/PLATELET
BASOS PCT: 1.2 %
Basophils Absolute: 133 cells/uL (ref 0–200)
EOS ABS: 278 {cells}/uL (ref 15–500)
EOS PCT: 2.5 %
HCT: 44.3 % (ref 38.5–50.0)
HEMOGLOBIN: 15.3 g/dL (ref 13.2–17.1)
Lymphs Abs: 2398 cells/uL (ref 850–3900)
MCH: 29.3 pg (ref 27.0–33.0)
MCHC: 34.5 g/dL (ref 32.0–36.0)
MCV: 84.7 fL (ref 80.0–100.0)
MONOS PCT: 6.8 %
MPV: 10.8 fL (ref 7.5–12.5)
NEUTROS ABS: 7537 {cells}/uL (ref 1500–7800)
Neutrophils Relative %: 67.9 %
Platelets: 342 10*3/uL (ref 140–400)
RBC: 5.23 10*6/uL (ref 4.20–5.80)
RDW: 13.1 % (ref 11.0–15.0)
Total Lymphocyte: 21.6 %
WBC mixed population: 755 cells/uL (ref 200–950)
WBC: 11.1 10*3/uL — AB (ref 3.8–10.8)

## 2018-07-03 LAB — COMPLETE METABOLIC PANEL WITH GFR
AG Ratio: 1.7 (calc) (ref 1.0–2.5)
ALBUMIN MSPROF: 4.5 g/dL (ref 3.6–5.1)
ALKALINE PHOSPHATASE (APISO): 125 U/L — AB (ref 40–115)
ALT: 24 U/L (ref 9–46)
AST: 18 U/L (ref 10–35)
BUN: 20 mg/dL (ref 7–25)
CO2: 28 mmol/L (ref 20–32)
CREATININE: 1.16 mg/dL (ref 0.70–1.33)
Calcium: 9.8 mg/dL (ref 8.6–10.3)
Chloride: 97 mmol/L — ABNORMAL LOW (ref 98–110)
GFR, EST AFRICAN AMERICAN: 81 mL/min/{1.73_m2} (ref >=60)
GFR, EST NON AFRICAN AMERICAN: 70 mL/min/{1.73_m2} (ref >=60)
GLOBULIN: 2.7 g/dL (ref 1.9–3.7)
GLUCOSE: 304 mg/dL — AB (ref 65–99)
Potassium: 3.5 mmol/L (ref 3.5–5.3)
SODIUM: 136 mmol/L (ref 135–146)
TOTAL PROTEIN: 7.2 g/dL (ref 6.1–8.1)
Total Bilirubin: 0.6 mg/dL (ref 0.2–1.2)

## 2018-07-03 LAB — HEMOGLOBIN A1C
EAG (MMOL/L): 12.4 (calc)
Hgb A1c MFr Bld: 9.4 % of total Hgb — ABNORMAL HIGH (ref <5.7)
Mean Plasma Glucose: 223 (calc)

## 2018-07-03 LAB — LIPID PANEL
CHOL/HDL RATIO: 5.2 (calc) — AB (ref <5.0)
Cholesterol: 230 mg/dL — ABNORMAL HIGH (ref <200)
HDL: 44 mg/dL (ref >40)
LDL Cholesterol (Calc): 148 mg/dL (calc) — ABNORMAL HIGH
NON-HDL CHOLESTEROL (CALC): 186 mg/dL — AB (ref <130)
Triglycerides: 249 mg/dL — ABNORMAL HIGH (ref <150)

## 2018-07-03 LAB — VITAMIN D 25 HYDROXY (VIT D DEFICIENCY, FRACTURES): VIT D 25 HYDROXY: 23 ng/mL — AB (ref 30–100)

## 2018-07-03 LAB — MAGNESIUM: MAGNESIUM: 2 mg/dL (ref 1.5–2.5)

## 2018-07-03 LAB — TSH: TSH: 3.44 m[IU]/L (ref 0.40–4.50)

## 2018-07-03 MED ORDER — METFORMIN HCL ER 500 MG PO TB24: ORAL_TABLET | ORAL | 1 refills | Status: DC

## 2018-07-04 ENCOUNTER — Telehealth: Payer: Self-pay

## 2018-07-04 ENCOUNTER — Other Ambulatory Visit: Payer: Self-pay | Admitting: Adult Health

## 2018-07-04 MED ORDER — VALSARTAN-HYDROCHLOROTHIAZIDE 320-25 MG PO TABS: ORAL_TABLET | ORAL | 1 refills | Status: DC

## 2018-07-04 MED ORDER — AMLODIPINE BESYLATE 10 MG PO TABS: ORAL_TABLET | ORAL | 1 refills | Status: DC

## 2018-07-04 NOTE — Telephone Encounter (Signed)
Pharmacy states that patient's insurance would not cover the Olmesarta-amlodipine-HCTZ or separately either.  Requesting an rx for Diovan/HCTZ or Atacand/HCTZ

## 2018-07-07 NOTE — Telephone Encounter (Signed)
Patient notified

## 2018-10-10 ENCOUNTER — Ambulatory Visit: Payer: Self-pay | Admitting: Adult Health

## 2019-02-17 ENCOUNTER — Inpatient Hospital Stay (HOSPITAL_COMMUNITY)
Admission: EM | Admit: 2019-02-17 | Discharge: 2019-02-18 | DRG: 123 | Disposition: A | Discharge status: Home / Self Care | Payer: BC Managed Care – PPO | Source: Ambulatory Visit | Attending: Internal Medicine | Admitting: Internal Medicine

## 2019-02-17 ENCOUNTER — Encounter (HOSPITAL_COMMUNITY): Payer: Self-pay | Admitting: Emergency Medicine

## 2019-02-17 ENCOUNTER — Other Ambulatory Visit: Payer: Self-pay

## 2019-02-17 ENCOUNTER — Inpatient Hospital Stay (HOSPITAL_COMMUNITY): Payer: BC Managed Care – PPO

## 2019-02-17 ENCOUNTER — Emergency Department (HOSPITAL_COMMUNITY): Payer: BC Managed Care – PPO

## 2019-02-17 DIAGNOSIS — E785 Hyperlipidemia, unspecified: Secondary | ICD-10-CM | POA: Diagnosis not present

## 2019-02-17 DIAGNOSIS — I161 Hypertensive emergency: Secondary | ICD-10-CM | POA: Diagnosis not present

## 2019-02-17 DIAGNOSIS — I160 Hypertensive urgency: Secondary | ICD-10-CM | POA: Diagnosis not present

## 2019-02-17 DIAGNOSIS — Z8249 Family history of ischemic heart disease and other diseases of the circulatory system: Secondary | ICD-10-CM | POA: Diagnosis not present

## 2019-02-17 DIAGNOSIS — F419 Anxiety disorder, unspecified: Secondary | ICD-10-CM | POA: Diagnosis present

## 2019-02-17 DIAGNOSIS — I6523 Occlusion and stenosis of bilateral carotid arteries: Secondary | ICD-10-CM | POA: Diagnosis not present

## 2019-02-17 DIAGNOSIS — E876 Hypokalemia: Secondary | ICD-10-CM | POA: Diagnosis present

## 2019-02-17 DIAGNOSIS — Z7984 Long term (current) use of oral hypoglycemic drugs: Secondary | ICD-10-CM | POA: Diagnosis not present

## 2019-02-17 DIAGNOSIS — Z79899 Other long term (current) drug therapy: Secondary | ICD-10-CM

## 2019-02-17 DIAGNOSIS — Z20828 Contact with and (suspected) exposure to other viral communicable diseases: Secondary | ICD-10-CM | POA: Diagnosis not present

## 2019-02-17 DIAGNOSIS — G453 Amaurosis fugax: Principal | ICD-10-CM | POA: Diagnosis present

## 2019-02-17 DIAGNOSIS — E663 Overweight: Secondary | ICD-10-CM | POA: Diagnosis not present

## 2019-02-17 DIAGNOSIS — E119 Type 2 diabetes mellitus without complications: Secondary | ICD-10-CM | POA: Diagnosis not present

## 2019-02-17 DIAGNOSIS — Z8349 Family history of other endocrine, nutritional and metabolic diseases: Secondary | ICD-10-CM | POA: Diagnosis not present

## 2019-02-17 DIAGNOSIS — Z888 Allergy status to other drugs, medicaments and biological substances status: Secondary | ICD-10-CM | POA: Diagnosis not present

## 2019-02-17 DIAGNOSIS — I10 Essential (primary) hypertension: Secondary | ICD-10-CM | POA: Diagnosis present

## 2019-02-17 DIAGNOSIS — E1165 Type 2 diabetes mellitus with hyperglycemia: Secondary | ICD-10-CM | POA: Diagnosis not present

## 2019-02-17 DIAGNOSIS — Z9119 Patient's noncompliance with other medical treatment and regimen: Secondary | ICD-10-CM

## 2019-02-17 DIAGNOSIS — Z03818 Encounter for observation for suspected exposure to other biological agents ruled out: Secondary | ICD-10-CM | POA: Diagnosis not present

## 2019-02-17 DIAGNOSIS — R51 Headache: Secondary | ICD-10-CM | POA: Diagnosis not present

## 2019-02-17 DIAGNOSIS — H5462 Unqualified visual loss, left eye, normal vision right eye: Secondary | ICD-10-CM | POA: Diagnosis present

## 2019-02-17 DIAGNOSIS — Z6827 Body mass index (BMI) 27.0-27.9, adult: Secondary | ICD-10-CM | POA: Diagnosis not present

## 2019-02-17 DIAGNOSIS — G459 Transient cerebral ischemic attack, unspecified: Secondary | ICD-10-CM

## 2019-02-17 DIAGNOSIS — H53132 Sudden visual loss, left eye: Secondary | ICD-10-CM | POA: Diagnosis not present

## 2019-02-17 DIAGNOSIS — I340 Nonrheumatic mitral (valve) insufficiency: Secondary | ICD-10-CM | POA: Diagnosis not present

## 2019-02-17 DIAGNOSIS — H3412 Central retinal artery occlusion, left eye: Secondary | ICD-10-CM | POA: Diagnosis not present

## 2019-02-17 LAB — RAPID URINE DRUG SCREEN, HOSP PERFORMED
Amphetamines: NOT DETECTED
Barbiturates: NOT DETECTED
Benzodiazepines: NOT DETECTED
Cocaine: NOT DETECTED
Opiates: NOT DETECTED
Tetrahydrocannabinol: NOT DETECTED

## 2019-02-17 LAB — COMPREHENSIVE METABOLIC PANEL
ALT: 25 U/L (ref 0–44)
AST: 21 U/L (ref 15–41)
Albumin: 4.4 g/dL (ref 3.5–5.0)
Alkaline Phosphatase: 103 U/L (ref 38–126)
Anion gap: 11 (ref 5–15)
BUN: 12 mg/dL (ref 6–20)
CO2: 25 mmol/L (ref 22–32)
Calcium: 9.2 mg/dL (ref 8.9–10.3)
Chloride: 101 mmol/L (ref 98–111)
Creatinine, Ser: 1.15 mg/dL (ref 0.61–1.24)
GFR calc Af Amer: >60 mL/min (ref >60)
GFR calc non Af Amer: >60 mL/min (ref >60)
Glucose, Bld: 237 mg/dL — ABNORMAL HIGH (ref 70–99)
Potassium: 3.3 mmol/L — ABNORMAL LOW (ref 3.5–5.1)
Sodium: 137 mmol/L (ref 135–145)
Total Bilirubin: 1.3 mg/dL — ABNORMAL HIGH (ref 0.3–1.2)
Total Protein: 7.4 g/dL (ref 6.5–8.1)

## 2019-02-17 LAB — URINALYSIS, ROUTINE W REFLEX MICROSCOPIC
Bacteria, UA: NONE SEEN
Bilirubin Urine: NEGATIVE
Glucose, UA: >=500 mg/dL — AB
Hgb urine dipstick: NEGATIVE
Ketones, ur: 5 mg/dL — AB
Leukocytes,Ua: NEGATIVE
Nitrite: NEGATIVE
Protein, ur: NEGATIVE mg/dL
Specific Gravity, Urine: 1.003 — ABNORMAL LOW (ref 1.005–1.030)
pH: 6 (ref 5.0–8.0)

## 2019-02-17 LAB — DIFFERENTIAL
Abs Immature Granulocytes: 0.04 10*3/uL (ref 0.00–0.07)
Basophils Absolute: 0.1 10*3/uL (ref 0.0–0.1)
Basophils Relative: 1 %
Eosinophils Absolute: 0.1 10*3/uL (ref 0.0–0.5)
Eosinophils Relative: 1 %
Immature Granulocytes: 0 %
Lymphocytes Relative: 13 %
Lymphs Abs: 1.6 10*3/uL (ref 0.7–4.0)
Monocytes Absolute: 0.5 10*3/uL (ref 0.1–1.0)
Monocytes Relative: 4 %
Neutro Abs: 9.9 10*3/uL — ABNORMAL HIGH (ref 1.7–7.7)
Neutrophils Relative %: 81 %

## 2019-02-17 LAB — I-STAT CHEM 8, ED
BUN: 13 mg/dL (ref 6–20)
Calcium, Ion: 1.06 mmol/L — ABNORMAL LOW (ref 1.15–1.40)
Chloride: 100 mmol/L (ref 98–111)
Creatinine, Ser: 0.9 mg/dL (ref 0.61–1.24)
Glucose, Bld: 237 mg/dL — ABNORMAL HIGH (ref 70–99)
HCT: 46 % (ref 39.0–52.0)
Hemoglobin: 15.6 g/dL (ref 13.0–17.0)
Potassium: 3.3 mmol/L — ABNORMAL LOW (ref 3.5–5.1)
Sodium: 137 mmol/L (ref 135–145)
TCO2: 27 mmol/L (ref 22–32)

## 2019-02-17 LAB — CBC
HCT: 45.9 % (ref 39.0–52.0)
Hemoglobin: 16.3 g/dL (ref 13.0–17.0)
MCH: 29.2 pg (ref 26.0–34.0)
MCHC: 35.5 g/dL (ref 30.0–36.0)
MCV: 82.3 fL (ref 80.0–100.0)
Platelets: 278 10*3/uL (ref 150–400)
RBC: 5.58 MIL/uL (ref 4.22–5.81)
RDW: 12.2 % (ref 11.5–15.5)
WBC: 12.2 10*3/uL — ABNORMAL HIGH (ref 4.0–10.5)
nRBC: 0 % (ref 0.0–0.2)

## 2019-02-17 LAB — PROTIME-INR
INR: 1 (ref 0.8–1.2)
Prothrombin Time: 12.9 seconds (ref 11.4–15.2)

## 2019-02-17 LAB — ETHANOL: Alcohol, Ethyl (B): <10 mg/dL (ref <10)

## 2019-02-17 LAB — APTT: aPTT: 28 seconds (ref 24–36)

## 2019-02-17 LAB — SARS CORONAVIRUS 2 BY RT PCR (HOSPITAL ORDER, PERFORMED IN ~~LOC~~ HOSPITAL LAB): SARS Coronavirus 2: NEGATIVE

## 2019-02-17 MED ORDER — HYOSCYAMINE SULFATE 0.125 MG SL SUBL: 0.2500 mg | SUBLINGUAL_TABLET | Freq: Once | SUBLINGUAL | Status: DC

## 2019-02-17 MED ORDER — ENOXAPARIN SODIUM 40 MG/0.4ML ~~LOC~~ SOLN: 40.0000 mg | SUBCUTANEOUS | Status: DC

## 2019-02-17 MED ORDER — LORAZEPAM 1 MG PO TABS
2.0000 mg | ORAL_TABLET | Freq: Once | ORAL | Status: AC
  Administered 2019-02-17: 2 mg via ORAL
  Filled 2019-02-17: qty 2

## 2019-02-17 MED ORDER — PROPARACAINE HCL 0.5 % OP SOLN
1.0000 [drp] | Freq: Once | OPHTHALMIC | Status: AC
  Administered 2019-02-17: 1 [drp] via OPHTHALMIC
  Filled 2019-02-17: qty 15

## 2019-02-17 MED ORDER — SODIUM CHLORIDE 0.9% FLUSH
3.0000 mL | Freq: Two times a day (BID) | INTRAVENOUS | Status: DC
  Administered 2019-02-17 – 2019-02-18 (×2): 3 mL via INTRAVENOUS

## 2019-02-17 MED ORDER — ALPRAZOLAM 0.25 MG PO TABS
0.5000 mg | ORAL_TABLET | Freq: Once | ORAL | Status: AC
  Administered 2019-02-17: 0.5 mg via ORAL
  Filled 2019-02-17: qty 2

## 2019-02-17 MED ORDER — LABETALOL HCL 5 MG/ML IV SOLN
10.0000 mg | INTRAVENOUS | Status: DC | PRN
  Administered 2019-02-17: 10 mg via INTRAVENOUS
  Filled 2019-02-17: qty 4

## 2019-02-17 MED ORDER — LABETALOL HCL 5 MG/ML IV SOLN
10.0000 mg | Freq: Once | INTRAVENOUS | Status: AC
  Administered 2019-02-17: 10 mg via INTRAVENOUS
  Filled 2019-02-17: qty 4

## 2019-02-17 MED ORDER — ASPIRIN EC 325 MG PO TBEC
325.0000 mg | DELAYED_RELEASE_TABLET | Freq: Once | ORAL | Status: AC
  Administered 2019-02-17: 325 mg via ORAL
  Filled 2019-02-17: qty 1

## 2019-02-17 MED ORDER — IOHEXOL 350 MG/ML SOLN
100.0000 mL | Freq: Once | INTRAVENOUS | Status: AC | PRN
  Administered 2019-02-17: 100 mL via INTRAVENOUS

## 2019-02-17 NOTE — ED Notes (Signed)
ED TO INPATIENT HANDOFF REPORT  ED Nurse Name and Phone #:   S Name/Age/Gender Particia Hoover 58 y.o. male Room/Bed: 043C/043C  Code Status   Code Status: Full Code  Home/SNF/Other Home Patient oriented to: self, place, time and situation Is this baseline? Yes   Triage Complete: Triage complete  Chief Complaint Aterial Stroke  Triage Note Pt was standing in the store around 0815 when his left eye went completely black, he saw only flashing lights. He went to the eye care center and diagnosed with an "arterial stroke." at triage his BP 235/140. Denies chest pain/sob. He is resistant to come, his wife at the bedside reports he does not see a dr. He is very nervous.    Allergies Allergies  Allergen Reactions  . Atenolol Other (See Comments)    Made tired    Level of Care/Admitting Diagnosis ED Disposition    ED Disposition Condition Comment   Admit  Hospital Area: [facility] [[id number]]  Level of Care: Telemetry Medical [104]  Covid Evaluation: Asymptomatic Screening Protocol (No Symptoms)  Diagnosis: Amaurosis fugax [168372]  Admitting Physician: Oda Kilts [[id number]]  Attending Physician: Oda Kilts [[id number]]  Estimated length of stay: past midnight tomorrow  Certification:: I certify this patient will need inpatient services for at least 2 midnights  PT Class (Do Not Modify): Inpatient [101]  PT Acc Code (Do Not Modify): Private [1]       B Medical/Surgery History Past Medical History:  Diagnosis Date  . Anxiety   . Hyperlipidemia   . Hypertension   . Migraines   . Prediabetes   . Vitamin D deficiency    History reviewed. No pertinent surgical history.   A IV Location/Drains/Wounds Patient Lines/Drains/Airways Status   Active Line/Drains/Airways    Name:   Placement date:   Placement time:   Site:   Days:   Peripheral IV 02/17/19 Right Antecubital   02/17/19    1514    Antecubital   less than 1           Intake/Output Last 24 hours No intake or output data in the 24 hours ending 02/17/19 1928  Labs/Imaging Results for orders placed or performed during the hospital encounter of 02/17/19 (from the past 48 hour(s))  Urine rapid drug screen (hosp performed)     Status: None   Collection Time: 02/17/19  1:43 PM  Result Value Ref Range   Opiates NONE DETECTED NONE DETECTED   Cocaine NONE DETECTED NONE DETECTED   Benzodiazepines NONE DETECTED NONE DETECTED   Amphetamines NONE DETECTED NONE DETECTED   Tetrahydrocannabinol NONE DETECTED NONE DETECTED   Barbiturates NONE DETECTED NONE DETECTED    Comment: (NOTE) DRUG SCREEN FOR MEDICAL PURPOSES ONLY.  IF CONFIRMATION IS NEEDED FOR ANY PURPOSE, NOTIFY LAB WITHIN 5 DAYS. LOWEST DETECTABLE LIMITS FOR URINE DRUG SCREEN Drug Class                     Cutoff (ng/mL) Amphetamine and metabolites    1000 Barbiturate and metabolites    200 Benzodiazepine                 336 Tricyclics and metabolites     300 Opiates and metabolites        300 Cocaine and metabolites        300 THC  50 Performed at [facility], [place] [address] [place], [place] [address]   Urinalysis, Routine w reflex microscopic     Status: Abnormal   Collection Time: 02/17/19  1:43 PM  Result Value Ref Range   Color, Urine COLORLESS (A) YELLOW   APPearance CLEAR CLEAR   Specific Gravity, Urine 1.003 (L) 1.005 - 1.030   pH 6.0 5.0 - 8.0   Glucose, UA >=500 (A) NEGATIVE mg/dL   Hgb urine dipstick NEGATIVE NEGATIVE   Bilirubin Urine NEGATIVE NEGATIVE   Ketones, ur 5 (A) NEGATIVE mg/dL   Protein, ur NEGATIVE NEGATIVE mg/dL   Nitrite NEGATIVE NEGATIVE   Leukocytes,Ua NEGATIVE NEGATIVE   WBC, UA 0-5 0 - 5 WBC/hpf   Bacteria, UA NONE SEEN NONE SEEN    Comment: Performed at [facility] [address], [place], [place] [address]  Ethanol     Status: None   Collection Time: 02/17/19  3:00 PM  Result Value Ref Range   Alcohol, Ethyl  (B) <10 <10 mg/dL    Comment: (NOTE) Lowest detectable limit for serum alcohol is 10 mg/dL. For medical purposes only. Performed at [facility], [place] [address], [place], [place] [address]   Protime-INR     Status: None   Collection Time: 02/17/19  3:00 PM  Result Value Ref Range   Prothrombin Time 12.9 11.4 - 15.2 seconds   INR 1.0 0.8 - 1.2    Comment: (NOTE) INR goal varies based on device and disease states. Performed at [facility], [place] [address] [place], [place] [address]   APTT     Status: None   Collection Time: 02/17/19  3:00 PM  Result Value Ref Range   aPTT 28 24 - 36 seconds    Comment: Performed at [facility] [address] [place], [place] [address]  CBC     Status: Abnormal   Collection Time: 02/17/19  3:00 PM  Result Value Ref Range   WBC 12.2 (H) 4.0 - 10.5 K/uL   RBC 5.58 4.22 - 5.81 MIL/uL   Hemoglobin 16.3 13.0 - 17.0 g/dL   HCT 45.9 39.0 - 52.0 %   MCV 82.3 80.0 - 100.0 fL   MCH 29.2 26.0 - 34.0 pg   MCHC 35.5 30.0 - 36.0 g/dL   RDW 12.2 11.5 - 15.5 %   Platelets 278 150 - 400 K/uL   nRBC 0.0 0.0 - 0.2 %    Comment: Performed at [facility], [place] [address], [place],  [address]  Differential     Status: Abnormal   Collection Time: 02/17/19  3:00 PM  Result Value Ref Range   Neutrophils Relative % 81 %   Neutro Abs 9.9 (H) 1.7 - 7.7 K/uL   Lymphocytes Relative 13 %   Lymphs Abs 1.6 0.7 - 4.0 K/uL   Monocytes Relative 4 %   Monocytes Absolute 0.5 0.1 - 1.0 K/uL   Eosinophils Relative 1 %   Eosinophils Absolute 0.1 0.0 - 0.5 K/uL   Basophils Relative 1 %   Basophils Absolute 0.1 0.0 - 0.1 K/uL   Immature Granulocytes 0 %   Abs Immature Granulocytes 0.04 0.00 - 0.07 K/uL    Comment: Performed at [facility] [address], [place],  [address]  Comprehensive metabolic panel     Status: Abnormal   Collection Time: 02/17/19  3:00 PM  Result Value Ref Range   Sodium 137 135 -  145 mmol/L    Potassium 3.3 (L) 3.5 - 5.1 mmol/L   Chloride 101 98 - 111 mmol/L   CO2 25 22 - 32 mmol/L   Glucose, Bld 237 (H) 70 - 99 mg/dL   BUN 12 6 - 20 mg/dL   Creatinine, Ser 1.15 0.61 - 1.24 mg/dL   Calcium 9.2 8.9 - 10.3 mg/dL   Total Protein 7.4 6.5 - 8.1 g/dL   Albumin 4.4 3.5 - 5.0 g/dL   AST 21 15 - 41 U/L   ALT 25 0 - 44 U/L   Alkaline Phosphatase 103 38 - 126 U/L   Total Bilirubin 1.3 (H) 0.3 - 1.2 mg/dL   GFR calc non Af [name] >60 >60 mL/min   GFR calc Af [name] >60 >60 mL/min   Anion gap 11 5 - 15    Comment: Performed at [facility] [address] [place], [place] [address]  I-stat chem 8, ED     Status: Abnormal   Collection Time: 02/17/19  3:18 PM  Result Value Ref Range   Sodium 137 135 - 145 mmol/L   Potassium 3.3 (L) 3.5 - 5.1 mmol/L   Chloride 100 98 - 111 mmol/L   BUN 13 6 - 20 mg/dL   Creatinine, Ser 0.90 0.61 - 1.24 mg/dL   Glucose, Bld 237 (H) 70 - 99 mg/dL   Calcium, Ion 1.06 (L) 1.15 - 1.40 mmol/L   TCO2 27 22 - 32 mmol/L   Hemoglobin 15.6 13.0 - 17.0 g/dL   HCT 46.0 39.0 - 52.0 %   Ct Angio Head W Or Wo Contrast  Result Date: 02/17/2019 CLINICAL DATA:  Left eye vision loss. EXAM: CT ANGIOGRAPHY HEAD AND NECK TECHNIQUE: Multidetector CT imaging of the head and neck was performed using the standard protocol during bolus administration of intravenous contrast. Multiplanar CT image reconstructions and MIPs were obtained to evaluate the vascular anatomy. Carotid stenosis measurements (when applicable) are obtained utilizing NASCET criteria, using the distal internal carotid diameter as the denominator. CONTRAST:  171mL OMNIPAQUE IOHEXOL 350 MG/ML SOLN COMPARISON:  None. FINDINGS: CT HEAD FINDINGS Brain: There is no mass, hemorrhage or extra-axial collection. The size and configuration of the ventricles and extra-axial CSF spaces are normal. There is no acute or chronic infarction. The brain parenchyma is normal. Skull: The visualized skull base, calvarium and  extracranial soft tissues are normal. Sinuses/Orbits: No fluid levels or advanced mucosal thickening of the visualized paranasal sinuses. No mastoid or middle ear effusion. The orbits are normal. CTA NECK FINDINGS SKELETON: There is no bony spinal canal stenosis. No lytic or blastic lesion. OTHER NECK: Normal pharynx, larynx and major salivary glands. No cervical lymphadenopathy. Heterogeneous thyroid gland. UPPER CHEST: No pneumothorax or pleural effusion. No nodules or masses. AORTIC ARCH: There is no calcific atherosclerosis of the aortic arch. There is no aneurysm, dissection or hemodynamically significant stenosis of the visualized ascending aorta and aortic arch. Conventional 3 vessel aortic branching pattern. The visualized proximal subclavian arteries are widely patent. RIGHT CAROTID SYSTEM: --Common carotid artery: Widely patent origin without common carotid artery dissection or aneurysm. --Internal carotid artery: No dissection, occlusion or aneurysm. Mild atherosclerotic calcification at the carotid bifurcation without hemodynamically significant stenosis. --External carotid artery: No acute abnormality. LEFT CAROTID SYSTEM: --Common carotid artery: Widely patent origin without common carotid artery dissection or aneurysm. --Internal carotid artery: No dissection, occlusion or aneurysm. Mild atherosclerotic lack at the carotid bifurcation without hemodynamically significant stenosis. --External carotid artery: No acute abnormality. VERTEBRAL  ARTERIES: Codominant configuration. Mild narrowing at the left vertebral artery origin. No dissection, occlusion or flow-limiting stenosis to the skull base (V1-V3 segments). CTA HEAD FINDINGS POSTERIOR CIRCULATION: --Vertebral arteries: Normal V4 segments. --Posterior inferior cerebellar arteries (PICA): Patent origins from the vertebral arteries. --Anterior inferior cerebellar arteries (AICA): Patent origins from the basilar artery. --Basilar artery: Normal.  --Superior cerebellar arteries: Normal. --Posterior cerebral arteries (PCA): Normal. Both originate from the basilar artery. Posterior communicating arteries (p-comm) are diminutive or absent. ANTERIOR CIRCULATION: --Intracranial internal carotid arteries: Normal. --Anterior cerebral arteries (ACA): Normal. Both A1 segments are present. Patent anterior communicating artery (a-comm). --Middle cerebral arteries (MCA): Normal. VENOUS SINUSES: As permitted by contrast timing, patent. ANATOMIC VARIANTS: None Review of the MIP images confirms the above findings. IMPRESSION: 1. No emergent large vessel occlusion or hemodynamically significant stenosis by NASCET criteria. 2. Mild bilateral carotid bifurcation atherosclerosis. 3. Mild narrowing of the left vertebral artery origin. Electronically Signed   By: Ulyses Jarred M.D.   On: 02/17/2019 16:38   Ct Angio Neck W And/or Wo Contrast  Result Date: 02/17/2019 CLINICAL DATA:  Left eye vision loss. EXAM: CT ANGIOGRAPHY HEAD AND NECK TECHNIQUE: Multidetector CT imaging of the head and neck was performed using the standard protocol during bolus administration of intravenous contrast. Multiplanar CT image reconstructions and MIPs were obtained to evaluate the vascular anatomy. Carotid stenosis measurements (when applicable) are obtained utilizing NASCET criteria, using the distal internal carotid diameter as the denominator. CONTRAST:  177mL OMNIPAQUE IOHEXOL 350 MG/ML SOLN COMPARISON:  None. FINDINGS: CT HEAD FINDINGS Brain: There is no mass, hemorrhage or extra-axial collection. The size and configuration of the ventricles and extra-axial CSF spaces are normal. There is no acute or chronic infarction. The brain parenchyma is normal. Skull: The visualized skull base, calvarium and extracranial soft tissues are normal. Sinuses/Orbits: No fluid levels or advanced mucosal thickening of the visualized paranasal sinuses. No mastoid or middle ear effusion. The orbits are normal.  CTA NECK FINDINGS SKELETON: There is no bony spinal canal stenosis. No lytic or blastic lesion. OTHER NECK: Normal pharynx, larynx and major salivary glands. No cervical lymphadenopathy. Heterogeneous thyroid gland. UPPER CHEST: No pneumothorax or pleural effusion. No nodules or masses. AORTIC ARCH: There is no calcific atherosclerosis of the aortic arch. There is no aneurysm, dissection or hemodynamically significant stenosis of the visualized ascending aorta and aortic arch. Conventional 3 vessel aortic branching pattern. The visualized proximal subclavian arteries are widely patent. RIGHT CAROTID SYSTEM: --Common carotid artery: Widely patent origin without common carotid artery dissection or aneurysm. --Internal carotid artery: No dissection, occlusion or aneurysm. Mild atherosclerotic calcification at the carotid bifurcation without hemodynamically significant stenosis. --External carotid artery: No acute abnormality. LEFT CAROTID SYSTEM: --Common carotid artery: Widely patent origin without common carotid artery dissection or aneurysm. --Internal carotid artery: No dissection, occlusion or aneurysm. Mild atherosclerotic lack at the carotid bifurcation without hemodynamically significant stenosis. --External carotid artery: No acute abnormality. VERTEBRAL ARTERIES: Codominant configuration. Mild narrowing at the left vertebral artery origin. No dissection, occlusion or flow-limiting stenosis to the skull base (V1-V3 segments). CTA HEAD FINDINGS POSTERIOR CIRCULATION: --Vertebral arteries: Normal V4 segments. --Posterior inferior cerebellar arteries (PICA): Patent origins from the vertebral arteries. --Anterior inferior cerebellar arteries (AICA): Patent origins from the basilar artery. --Basilar artery: Normal. --Superior cerebellar arteries: Normal. --Posterior cerebral arteries (PCA): Normal. Both originate from the basilar artery. Posterior communicating arteries (p-comm) are diminutive or absent. ANTERIOR  CIRCULATION: --Intracranial internal carotid arteries: Normal. --Anterior cerebral arteries (ACA): Normal. Both A1 segments  are present. Patent anterior communicating artery (a-comm). --Middle cerebral arteries (MCA): Normal. VENOUS SINUSES: As permitted by contrast timing, patent. ANATOMIC VARIANTS: None Review of the MIP images confirms the above findings. IMPRESSION: 1. No emergent large vessel occlusion or hemodynamically significant stenosis by NASCET criteria. 2. Mild bilateral carotid bifurcation atherosclerosis. 3. Mild narrowing of the left vertebral artery origin. Electronically Signed   By: Ulyses Jarred M.D.   On: 02/17/2019 16:38    Pending Labs Unresulted Labs (From admission, onward)    Start     Ordered   02/24/19 0500  Creatinine, serum  (enoxaparin (LOVENOX)    CrCl >/= 30 ml/min)  Weekly,   R    Comments: while on enoxaparin therapy    02/17/19 1752   02/18/19 7262  Basic metabolic panel  Tomorrow morning,   R     02/17/19 1752   02/18/19 0500  CBC  Tomorrow morning,   R     02/17/19 1752   02/18/19 0500  Lipid panel  Tomorrow morning,   R     02/17/19 1752   02/17/19 1759  Sedimentation rate  Once,   STAT     02/17/19 1759   02/17/19 1759  C-reactive protein  Once,   STAT     02/17/19 1759   02/17/19 1753  HIV antibody (Routine Testing)  Once,   STAT     02/17/19 1752   02/17/19 1753  CBC  (enoxaparin (LOVENOX)    CrCl >/= 30 ml/min)  Once,   STAT    Comments: Baseline for enoxaparin therapy IF NOT ALREADY DRAWN.  Notify MD if PLT < 100 K.    02/17/19 1752   02/17/19 1753  Creatinine, serum  (enoxaparin (LOVENOX)    CrCl >/= 30 ml/min)  Once,   STAT    Comments: Baseline for enoxaparin therapy IF NOT ALREADY DRAWN.    02/17/19 1752   02/17/19 1753  TSH  Once,   STAT     02/17/19 1752   02/17/19 1753  Hemoglobin A1c  Once,   STAT     02/17/19 1752   02/17/19 1654  SARS Coronavirus 2 (CEPHEID - Performed in [place] hospital lab), [facility] Order  Once,   STAT     Question:  Rule Out  Answer:  Yes   02/17/19 1653          Vitals/Pain Today's Vitals   02/17/19 1546 02/17/19 1723 02/17/19 1730 02/17/19 1800  BP: (!) 183/121 (!) 182/134 (!) 172/116 (!) 208/136  Pulse: 88 86 71 (!) 146  Resp: 14 19 15 19   Temp:  98.5 F (36.9 C)    TempSrc:  Oral    SpO2: 97% 97% 98% 97%  PainSc: 0-No pain       Isolation Precautions No active isolations  Medications Medications  enoxaparin (LOVENOX) injection 40 mg (has no administration in time range)  sodium chloride flush (NS) 0.9 % injection 3 mL (has no administration in time range)  labetalol (NORMODYNE) injection 10 mg (has no administration in time range)  ALPRAZolam (XANAX) tablet 0.5 mg (0.5 mg Oral Given 02/17/19 1252)  LORazepam (ATIVAN) tablet 2 mg (2 mg Oral Given 02/17/19 1426)  iohexol (OMNIPAQUE) 350 MG/ML injection 100 mL (100 mLs Intravenous Contrast Given 02/17/19 1617)  labetalol (NORMODYNE) injection 10 mg (10 mg Intravenous Given 02/17/19 1728)  aspirin EC tablet 325 mg (325 mg Oral Given 02/17/19 1727)  proparacaine (ALCAINE) 0.5 % ophthalmic solution 1 drop (1 drop Both  Eyes Given 02/17/19 1733)    Mobility walks     Focused Assessments Neuro Assessment Handoff:  Swallow screen pass? Yes    NIH Stroke Scale ( + Modified Stroke Scale Criteria)  Interval: Initial Level of Consciousness (1a.)   : Alert, keenly responsive LOC Questions (1b. )   +: Answers both questions correctly LOC Commands (1c. )   + : Performs both tasks correctly Best Gaze (2. )  +: Normal Visual (3. )  +: No visual loss Facial Palsy (4. )    : Normal symmetrical movements Motor Arm, Left (5a. )   +: No drift Motor Arm, Right (5b. )   +: No drift Motor Leg, Left (6a. )   +: No drift Motor Leg, Right (6b. )   +: No drift Limb Ataxia (7. ): Absent Sensory (8. )   +: Normal, no sensory loss Best Language (9. )   +: No aphasia Dysarthria (10. ): Normal Extinction/Inattention (11.)   +: No  Abnormality Modified SS Total  +: 0 Complete NIHSS TOTAL: 0     Neuro Assessment:   Neuro Checks:   Initial (02/17/19 1420)  Last Documented NIHSS Modified Score: 0 (02/17/19 1844) Has TPA been given? No If patient is a Neuro Trauma and patient is going to OR before floor call report to [place] nurse: [phone] or [phone]     R Recommendations: See Admitting Provider Note  Report given to:   Additional Notes:

## 2019-02-17 NOTE — ED Provider Notes (Signed)
[facility] EMERGENCY DEPARTMENT Provider Note   CSN: [id number] Arrival date & time: 02/17/19  1150    History   Chief Complaint Chief Complaint  Patient presents with  . Arterial Stroke  . Loss of Vision    HPI Gabriel Hoover is a 58 y.o. male.     HPI Patient was shopping at [employer] this morning at 815 when he had sudden complete loss of vision in his left eye.  He reports everything just went black.  He did not have any headache or any other associated symptoms with that.  He reports that he went to his car and within about 10 to 15 minutes he was starting to see some light flashes.  Some more purple and red or white.  He went home and then immediately went to the optometrist.  Reports when he got there his vision was starting to return.  Ports he was examined at the optometrist office and by the time the exam was complete, his vision was back to normal.  He was however counseled that there was significant concern for a stroke and that he should be seen in the emergency department.  He reports this is the first time he has had anything like this happen.  He has had no prior stroke.  He reports his only medical history is hypertension.  He reports his blood pressure typically is 180s over 90s at his best.  Takes Diovan regularly.  He denies he has been sick in any other way.  He has had no recent fever chills.  No cough no chest pain no shortness of breath.  He has not been monitoring his blood pressure at home and cannot really recall the last time it was checked.  Patient does not smoke or drink alcohol. Past Medical History:  Diagnosis Date  . Anxiety   . Hyperlipidemia   . Hypertension   . Migraines   . Prediabetes   . Vitamin D deficiency     Patient Active Problem List   Diagnosis Date Noted  . Overweight (BMI 25.0-29.9) 05/19/2015  . Poor compliance with medication 05/19/2015  . Medication management 05/14/2014  . Severe needle phobia   .  Hyperlipidemia   . Hypertension   . T2_NIDDM ([place])   . Vitamin D deficiency     History reviewed. No pertinent surgical history.      Home Medications    Prior to Admission medications   Medication Sig Start Date End Date Taking? Authorizing Provider  valsartan-hydrochlorothiazide (DIOVAN-HCT) 320-25 MG tablet Start 1/2 tab daily; increase to full tab in 3 weeks for BP goal <150/90. Patient taking differently: Take 1 tablet by mouth daily.  07/04/18  Yes Liane Comber, NP  amLODipine (NORVASC) 10 MG tablet Defer starting medication until titrated up to valsartan/hctz full tab and tolerating. Then start 1/2 tab daily for 2 weeks, then full tab. Patient not taking: Reported on 02/17/2019 07/04/18   Liane Comber, NP  metFORMIN (GLUCOPHAGE-XR) 500 MG 24 hr tablet Start 1 tab daily with largest meal of the day for 2 weeks, then add to second largest meal of the day. Patient not taking: Reported on 02/17/2019 07/03/18   Liane Comber, NP    Family History Family History  Problem Relation Age of Onset  . Hyperlipidemia Father   . Heart disease Father     Social History Social History   Tobacco Use  . Smoking status: Never Smoker  . Smokeless tobacco: Never Used  Substance  Use Topics  . Alcohol use: No  . Drug use: No     Allergies   Atenolol   Review of Systems Review of Systems 10 Systems reviewed and are negative for acute change except as noted in the HPI.  Physical Exam Updated Vital Signs BP (!) 182/134 (BP Location: Left Arm)   Pulse 86   Temp 98.5 F (36.9 C) (Oral)   Resp 19   SpO2 97%   Physical Exam Constitutional:      Comments: Alert and nontoxic with normal mental status.  Patient well-nourished well-developed.  Clinically well appearance.  No respiratory distress.  HENT:     Head: Normocephalic and atraumatic.     Mouth/Throat:     Mouth: Mucous membranes are moist.     Pharynx: Oropharynx is clear.  Eyes:     Comments: Pupils are  symmetric.  Mid range at 4 to 5 mm.  Ocular motions normal.  Fundoscopic exam somewhat limited by irregular lens distortion suggests cataracts.  Yellow and red coloration of fundus is visible.  There are no evident dark areas of fundus on either eye.  I can somewhat make out optic disc but these are quite blurry.  I do suspect flame hemorrhages visible. Tonopen ocular pressures are 13 and 14 on the right and left eye respectively  Cardiovascular:     Rate and Rhythm: Normal rate and regular rhythm.  Pulmonary:     Effort: Pulmonary effort is normal.     Breath sounds: Normal breath sounds.  Abdominal:     General: There is no distension.     Palpations: Abdomen is soft.     Tenderness: There is no abdominal tenderness. There is no guarding.  Musculoskeletal: Normal range of motion.        General: No swelling or tenderness.     Right lower leg: No edema.     Left lower leg: No edema.  Skin:    General: Skin is warm and dry.  Neurological:     General: No focal deficit present.     Mental Status: He is oriented to person, place, and time.     Cranial Nerves: No cranial nerve deficit.     Sensory: No sensory deficit.     Coordination: Coordination normal.     Gait: Gait normal.     Comments: cognitive function and speech are normal.  No pronator drift.  Normal motor exam.  Psychiatric:        Mood and Affect: Mood normal.      ED Treatments / Results  Labs (all labs ordered are listed, but only abnormal results are displayed) Labs Reviewed  CBC - Abnormal; Notable for the following components:      Result Value   WBC 12.2 (*)    All other components within normal limits  DIFFERENTIAL - Abnormal; Notable for the following components:   Neutro Abs 9.9 (*)    All other components within normal limits  COMPREHENSIVE METABOLIC PANEL - Abnormal; Notable for the following components:   Potassium 3.3 (*)    Glucose, Bld 237 (*)    Total Bilirubin 1.3 (*)    All other components  within normal limits  URINALYSIS, ROUTINE W REFLEX MICROSCOPIC - Abnormal; Notable for the following components:   Color, Urine COLORLESS (*)    Specific Gravity, Urine 1.003 (*)    Glucose, UA >=500 (*)    Ketones, ur 5 (*)    All other components within normal limits  I-STAT CHEM 8, ED - Abnormal; Notable for the following components:   Potassium 3.3 (*)    Glucose, Bld 237 (*)    Calcium, Ion 1.06 (*)    All other components within normal limits  SARS CORONAVIRUS 2 (HOSPITAL ORDER, [facility])  ETHANOL  PROTIME-INR  APTT  RAPID URINE DRUG SCREEN, HOSP PERFORMED    EKG EKG Interpretation  Date/Time:  Tuesday February 17 2019 13:00:44 EDT Ventricular Rate:  95 PR Interval:    QRS Duration: 79 QT Interval:  367 QTC Calculation: 462 R Axis:   -10 Text Interpretation:  Sinus rhythm Probable left atrial enlargement Probable left ventricular hypertrophy Anterior Q waves, possibly due to LVH no STEMI. NO OLD COMPARISON Confirmed by Charlesetta Shanks [phone]) on 02/17/2019 5:40:10 PM   Radiology Ct Angio Head W Or Wo Contrast  Result Date: 02/17/2019 CLINICAL DATA:  Left eye vision loss. EXAM: CT ANGIOGRAPHY HEAD AND NECK TECHNIQUE: Multidetector CT imaging of the head and neck was performed using the standard protocol during bolus administration of intravenous contrast. Multiplanar CT image reconstructions and MIPs were obtained to evaluate the vascular anatomy. Carotid stenosis measurements (when applicable) are obtained utilizing NASCET criteria, using the distal internal carotid diameter as the denominator. CONTRAST:  137mL OMNIPAQUE IOHEXOL 350 MG/ML SOLN COMPARISON:  None. FINDINGS: CT HEAD FINDINGS Brain: There is no mass, hemorrhage or extra-axial collection. The size and configuration of the ventricles and extra-axial CSF spaces are normal. There is no acute or chronic infarction. The brain parenchyma is normal. Skull: The visualized skull base, calvarium and  extracranial soft tissues are normal. Sinuses/Orbits: No fluid levels or advanced mucosal thickening of the visualized paranasal sinuses. No mastoid or middle ear effusion. The orbits are normal. CTA NECK FINDINGS SKELETON: There is no bony spinal canal stenosis. No lytic or blastic lesion. OTHER NECK: Normal pharynx, larynx and major salivary glands. No cervical lymphadenopathy. Heterogeneous thyroid gland. UPPER CHEST: No pneumothorax or pleural effusion. No nodules or masses. AORTIC ARCH: There is no calcific atherosclerosis of the aortic arch. There is no aneurysm, dissection or hemodynamically significant stenosis of the visualized ascending aorta and aortic arch. Conventional 3 vessel aortic branching pattern. The visualized proximal subclavian arteries are widely patent. RIGHT CAROTID SYSTEM: --Common carotid artery: Widely patent origin without common carotid artery dissection or aneurysm. --Internal carotid artery: No dissection, occlusion or aneurysm. Mild atherosclerotic calcification at the carotid bifurcation without hemodynamically significant stenosis. --External carotid artery: No acute abnormality. LEFT CAROTID SYSTEM: --Common carotid artery: Widely patent origin without common carotid artery dissection or aneurysm. --Internal carotid artery: No dissection, occlusion or aneurysm. Mild atherosclerotic lack at the carotid bifurcation without hemodynamically significant stenosis. --External carotid artery: No acute abnormality. VERTEBRAL ARTERIES: Codominant configuration. Mild narrowing at the left vertebral artery origin. No dissection, occlusion or flow-limiting stenosis to the skull base (V1-V3 segments). CTA HEAD FINDINGS POSTERIOR CIRCULATION: --Vertebral arteries: Normal V4 segments. --Posterior inferior cerebellar arteries (PICA): Patent origins from the vertebral arteries. --Anterior inferior cerebellar arteries (AICA): Patent origins from the basilar artery. --Basilar artery: Normal.  --Superior cerebellar arteries: Normal. --Posterior cerebral arteries (PCA): Normal. Both originate from the basilar artery. Posterior communicating arteries (p-comm) are diminutive or absent. ANTERIOR CIRCULATION: --Intracranial internal carotid arteries: Normal. --Anterior cerebral arteries (ACA): Normal. Both A1 segments are present. Patent anterior communicating artery (a-comm). --Middle cerebral arteries (MCA): Normal. VENOUS SINUSES: As permitted by contrast timing, patent. ANATOMIC VARIANTS: None Review of the MIP images confirms the above findings. IMPRESSION: 1.  No emergent large vessel occlusion or hemodynamically significant stenosis by NASCET criteria. 2. Mild bilateral carotid bifurcation atherosclerosis. 3. Mild narrowing of the left vertebral artery origin. Electronically Signed   By: Ulyses Jarred M.D.   On: 02/17/2019 16:38   Ct Angio Neck W And/or Wo Contrast  Result Date: 02/17/2019 CLINICAL DATA:  Left eye vision loss. EXAM: CT ANGIOGRAPHY HEAD AND NECK TECHNIQUE: Multidetector CT imaging of the head and neck was performed using the standard protocol during bolus administration of intravenous contrast. Multiplanar CT image reconstructions and MIPs were obtained to evaluate the vascular anatomy. Carotid stenosis measurements (when applicable) are obtained utilizing NASCET criteria, using the distal internal carotid diameter as the denominator. CONTRAST:  117mL OMNIPAQUE IOHEXOL 350 MG/ML SOLN COMPARISON:  None. FINDINGS: CT HEAD FINDINGS Brain: There is no mass, hemorrhage or extra-axial collection. The size and configuration of the ventricles and extra-axial CSF spaces are normal. There is no acute or chronic infarction. The brain parenchyma is normal. Skull: The visualized skull base, calvarium and extracranial soft tissues are normal. Sinuses/Orbits: No fluid levels or advanced mucosal thickening of the visualized paranasal sinuses. No mastoid or middle ear effusion. The orbits are normal.  CTA NECK FINDINGS SKELETON: There is no bony spinal canal stenosis. No lytic or blastic lesion. OTHER NECK: Normal pharynx, larynx and major salivary glands. No cervical lymphadenopathy. Heterogeneous thyroid gland. UPPER CHEST: No pneumothorax or pleural effusion. No nodules or masses. AORTIC ARCH: There is no calcific atherosclerosis of the aortic arch. There is no aneurysm, dissection or hemodynamically significant stenosis of the visualized ascending aorta and aortic arch. Conventional 3 vessel aortic branching pattern. The visualized proximal subclavian arteries are widely patent. RIGHT CAROTID SYSTEM: --Common carotid artery: Widely patent origin without common carotid artery dissection or aneurysm. --Internal carotid artery: No dissection, occlusion or aneurysm. Mild atherosclerotic calcification at the carotid bifurcation without hemodynamically significant stenosis. --External carotid artery: No acute abnormality. LEFT CAROTID SYSTEM: --Common carotid artery: Widely patent origin without common carotid artery dissection or aneurysm. --Internal carotid artery: No dissection, occlusion or aneurysm. Mild atherosclerotic lack at the carotid bifurcation without hemodynamically significant stenosis. --External carotid artery: No acute abnormality. VERTEBRAL ARTERIES: Codominant configuration. Mild narrowing at the left vertebral artery origin. No dissection, occlusion or flow-limiting stenosis to the skull base (V1-V3 segments). CTA HEAD FINDINGS POSTERIOR CIRCULATION: --Vertebral arteries: Normal V4 segments. --Posterior inferior cerebellar arteries (PICA): Patent origins from the vertebral arteries. --Anterior inferior cerebellar arteries (AICA): Patent origins from the basilar artery. --Basilar artery: Normal. --Superior cerebellar arteries: Normal. --Posterior cerebral arteries (PCA): Normal. Both originate from the basilar artery. Posterior communicating arteries (p-comm) are diminutive or absent. ANTERIOR  CIRCULATION: --Intracranial internal carotid arteries: Normal. --Anterior cerebral arteries (ACA): Normal. Both A1 segments are present. Patent anterior communicating artery (a-comm). --Middle cerebral arteries (MCA): Normal. VENOUS SINUSES: As permitted by contrast timing, patent. ANATOMIC VARIANTS: None Review of the MIP images confirms the above findings. IMPRESSION: 1. No emergent large vessel occlusion or hemodynamically significant stenosis by NASCET criteria. 2. Mild bilateral carotid bifurcation atherosclerosis. 3. Mild narrowing of the left vertebral artery origin. Electronically Signed   By: Ulyses Jarred M.D.   On: 02/17/2019 16:38    Procedures Procedures (including critical care time) CRITICAL CARE Performed by: Charlesetta Shanks   Total critical care time: 30 minutes  Critical care time was exclusive of separately billable procedures and treating other patients.  Critical care was necessary to treat or prevent imminent or life-threatening deterioration.  Critical care was time spent  personally by me on the following activities: development of treatment plan with patient and/or surrogate as well as nursing, discussions with consultants, evaluation of patient's response to treatment, examination of patient, obtaining history from patient or surrogate, ordering and performing treatments and interventions, ordering and review of laboratory studies, ordering and review of radiographic studies, pulse oximetry and re-evaluation of patient's condition. Medications Ordered in ED Medications  ALPRAZolam Duanne Moron) tablet 0.5 mg (0.5 mg Oral Given 02/17/19 1252)  LORazepam (ATIVAN) tablet 2 mg (2 mg Oral Given 02/17/19 1426)  iohexol (OMNIPAQUE) 350 MG/ML injection 100 mL (100 mLs Intravenous Contrast Given 02/17/19 1617)  labetalol (NORMODYNE) injection 10 mg (10 mg Intravenous Given 02/17/19 1728)  aspirin EC tablet 325 mg (325 mg Oral Given 02/17/19 1727)  proparacaine (ALCAINE) 0.5 % ophthalmic  solution 1 drop (1 drop Both Eyes Given 02/17/19 1733)     Initial Impression / Assessment and Plan / ED Course  I have reviewed the triage vital signs and the nursing notes.  Pertinent labs & imaging results that were available during my care of the patient were reviewed by me and considered in my medical decision making (see chart for details).  Clinical Course as of Feb 17 1740  Tue Feb 17, 2019  1246 Patient has twice refused IV start due to severe phobia and anxiety.  He advises he has to have oral pretreatment with antianxiety medication in order to tolerate an IV placement.  Patient is advised that his management is time sensitive for stroke.  He voices understanding.  He advises he absolutely cannot undergo IV establishment without sedation.   [MP]  1251 Proceed with noncontrasted CT first to rule out a hemorrhage. goal  Is to get CT angiogram once IV established.   [MP]  1251 Consult: Reviewed with Dr. Leonel Ramsay.  Will evaluate the patient in the emergency department.  Agrees with proceeding with CT and CT angiogram as soon as possible.  Try to address blood pressure as soon as possible with goal of systolic less than 629/528   [MP]  1434 Onset: Reviewed with Dr. Posey Pronto of ophthalmology.  Requests documented visual acuity and will see the patient after he is admitted to medical service.He wants consult called to him at that time   [MP]    Clinical Course User Index [MP] Charlesetta Shanks, MD      Patient presents as outlined above.  Sudden onset of visual loss in the left eye.  Patient is severely hypertensive.  He however does not have headache or confusion.  Symptoms have resolved.  Neurology has been consulted.  Patient will be admitted for TIA with amaurosis fugax.  Intraocular pressures are normal by Tono-Pen.  Patient has been treated with 1 dose of labetalol for significantly elevated blood pressure and at this time is within desired range of 170s over 110s.  Final Clinical  Impressions(s) / ED Diagnoses   Final diagnoses:  Amaurosis fugax  Hypertensive emergency without congestive heart failure    ED Discharge Orders    None       Charlesetta Shanks, MD 02/17/19 1742

## 2019-02-17 NOTE — ED Triage Notes (Signed)
Pt was standing in the store around 0815 when his left eye went completely black, he saw only flashing lights. He went to the eye care center and diagnosed with an "arterial stroke." at triage his BP 235/140. Denies chest pain/sob. He is resistant to come, his wife at the bedside reports he does not see a dr. He is very nervous.

## 2019-02-17 NOTE — Consult Note (Addendum)
Neurology Consultation  Reason for Consult: Amaurosis fugax Referring Physician: Dr. Vallery Ridge  History is obtained from: Patient  HPI: Gabriel Hoover is a 58 y.o. male history of prediabetes, migraines, hypertension, hyperlipidemia and anxiety.  Patient states that he was fine this morning until about 8:30 in the morning when he was driving.  He suddenly noticed a loss of vision in his left eye.  He went to his ophthalmologist.  On the way there he noted that some of his vision to light came back but he was having kaleidoscope, different color laser like images.  By the time he got to the ophthalmologist he noted that he was able to make out shapes and his vision was slowly coming back.  Ophthalmologist dilated his eye but did not see any abnormalities but was afraid he had a retinal artery occlusion.  For that reason he was brought to [facility].  Currently he states that the whole time frame to which she had lost his vision was approximately 1 hour and at this time he has fully recovered.  He denies any other symptoms.  Of note: He did state that a couple days ago he had a significant quick headache in the posterior aspect of his head.  He does have migraine headaches but nothing that would have symptoms to which he was having today.  He denies any dysarthria, diplopia, numbness or tingling, weakness of upper extremities or lower extremities.  He does note that he has hypertension has been working on this for quite a long time.  It seems as though no matter what medication he is on his elevated blood pressure does not seem to be resolving.  LKW: 8:30 AM tpa given?: no, symptoms resolved Premorbid modified Rankin scale (mRS): 0 NIH stroke scale 0   ROS: A 14 point ROS was performed and is negative except as noted in the HPI.   Past Medical History:  Diagnosis Date  . Anxiety   . Hyperlipidemia   . Hypertension   . Migraines   . Prediabetes   . Vitamin D deficiency      Family  History  Problem Relation Age of Onset  . Hyperlipidemia Father   . Heart disease Father     Social History:   reports that he has never smoked. He has never used smokeless tobacco. He reports that he does not drink alcohol or use drugs.  Medications  Current Facility-Administered Medications:  .  LORazepam (ATIVAN) tablet 2 mg, 2 mg, Oral, Once, Pfeiffer, Marcy, MD  Current Outpatient Medications:  .  valsartan-hydrochlorothiazide (DIOVAN-HCT) 320-25 MG tablet, Start 1/2 tab daily; increase to full tab in 3 weeks for BP goal <150/90. (Patient taking differently: Take 1 tablet by mouth daily. ), Disp: 90 tablet, Rfl: 1 .  amLODipine (NORVASC) 10 MG tablet, Defer starting medication until titrated up to valsartan/hctz full tab and tolerating. Then start 1/2 tab daily for 2 weeks, then full tab. (Patient not taking: Reported on 02/17/2019), Disp: 90 tablet, Rfl: 1 .  metFORMIN (GLUCOPHAGE-XR) 500 MG 24 hr tablet, Start 1 tab daily with largest meal of the day for 2 weeks, then add to second largest meal of the day. (Patient not taking: Reported on 02/17/2019), Disp: 180 tablet, Rfl: 1   Exam: Current vital signs: BP (!) 204/130   Pulse 86   Temp 98.6 F (37 C) (Oral)   Resp 20   SpO2 96%  Vital signs in last 24 hours: Temp:  [98.6 F (37 C)]  98.6 F (37 C) (07/07 1157) Pulse Rate:  [82-96] 86 (07/07 1400) Resp:  [18-24] 20 (07/07 1400) BP: (183-250)/(125-143) 204/130 (07/07 1400) SpO2:  [96 %-100 %] 96 % (07/07 1400)  Physical Exam  Constitutional: Appears well-developed and well-nourished.  Psych: Affect appropriate to situation Eyes: No scleral injection HENT: No OP obstrucion Head: Normocephalic.  Cardiovascular: Normal rate and regular rhythm.  Respiratory: Effort normal, non-labored breathing GI: Soft.  No distension. There is no tenderness.  Skin: WDI  Neuro: Mental Status: Patient is awake, alert, oriented to person, place, month, year, and situation. Patient is  able to give a clear and coherent history. No signs of aphasia or neglect Cranial Nerves: II: Visual Fields are full.  III,IV, VI: EOMI without ptosis or diploplia. Pupils equal, round and reactive to light V: Facial sensation is symmetric to temperature VII: Facial movement is symmetric.  VIII: hearing is intact to voice X: Palat elevates symmetrically XI: Shoulder shrug is symmetric. XII: tongue is midline without atrophy or fasciculations.  Motor: Tone is normal. Bulk is normal. 5/5 strength was present in all four extremities.  Sensory: Sensation is symmetric to light touch and temperature in the arms and legs. Deep Tendon Reflexes: 2+ and symmetric in the biceps and patellae.  Plantars: Toes are downgoing bilaterally.  Cerebellar: FNF and HKS are intact bilaterally  Labs I have reviewed labs in epic and the results pertinent to this consultation are:   CBC    Component Value Date/Time   WBC 11.1 (H) 07/02/2018 1030   RBC 5.23 07/02/2018 1030   HGB 15.3 07/02/2018 1030   HCT 44.3 07/02/2018 1030   PLT 342 07/02/2018 1030   MCV 84.7 07/02/2018 1030   MCH 29.3 07/02/2018 1030   MCHC 34.5 07/02/2018 1030   RDW 13.1 07/02/2018 1030   LYMPHSABS 2,398 07/02/2018 1030   MONOABS 0.5 05/19/2015 1023   EOSABS 278 07/02/2018 1030   BASOSABS 133 07/02/2018 1030    CMP     Component Value Date/Time   NA 136 07/02/2018 1030   K 3.5 07/02/2018 1030   CL 97 (L) 07/02/2018 1030   CO2 28 07/02/2018 1030   GLUCOSE 304 (H) 07/02/2018 1030   BUN 20 07/02/2018 1030   CREATININE 1.16 07/02/2018 1030   CALCIUM 9.8 07/02/2018 1030   PROT 7.2 07/02/2018 1030   ALBUMIN 4.2 05/19/2015 1023   AST 18 07/02/2018 1030   ALT 24 07/02/2018 1030   ALKPHOS 96 05/19/2015 1023   BILITOT 0.6 07/02/2018 1030   GFRNONAA 70 07/02/2018 1030   GFRAA 81 07/02/2018 1030    Lipid Panel     Component Value Date/Time   CHOL 230 (H) 07/02/2018 1030   TRIG 249 (H) 07/02/2018 1030   HDL 44  07/02/2018 1030   CHOLHDL 5.2 (H) 07/02/2018 1030   VLDL 51 (H) 07/18/2015 1727   LDLCALC 148 (H) 07/02/2018 1030     Imaging I have reviewed the images obtained:  CT angios of head and neck along with CT head pending    Etta Quill PA-C [facility] [phone]  M-F  (9:00 am- 5:00 PM)  02/17/2019, 2:22 PM     NEUROHOSPITALIST ADDENDUM Performed a face to face diagnostic evaluation.   I have reviewed the contents of history and physical exam as documented by PA/ARNP/Resident and agree with above documentation.  I have discussed and formulated the above plan as documented. Edits to the note have been made as needed.   58 year old male with history  of prediabetes, hypertension that is poorly controlled and hyperlipidemia, headaches presented to [facility] emergency room after having sudden onset loss of vision in his left eye.  Patient was shopping at [facility] when suddenly, and a matter of seconds he lost complete vision in the left eye.  The patient drove himself to the ophthalmologist office.  Patient states that for about 15 minutes the vision was completely black after which he could perceive light.  And then he visualized multiple " bolts of bright colors" with different shapes.  About 30 minutes he was able to make out shadows.  Patient was at his ophthalmologist office as symptoms continue to improve and resolve completely in about 1 hour.  His ophthalmologist did a fundus exam and did not see any abnormalities but sent him to the emergency department due to concern that this could be amaurosis fugax.  Patient has history of chronic headaches, approximately once every week.  He states his headaches began at his neck and then gradually involve the entire head, more pressure-like.  Denies photophobia, nausea with headache.  Improves with Excedrin Migraine.  Denies visual aura with his headaches.   On examination, patient is neurologically intact.  Cranial reflexes are  intact.  Vision is intact.  Attempted to visualize fundus, unable to do so at this was not a dilated exam.  No motor or sensory deficits.  Work-up included a CT angiogram which shows mild to moderate atherosclerosis with calcification.  I personally reviewed the CT angiogram and is negative for significant stenosis, ulcerated plaque that would require any surgical intervention.  MRI brain negative for acute infarct.   Impression -I do think based on the history of sudden complete vision loss lasting for approximately 1 hour and significant history of vascular risk factors including uncontrolled hypertension that this is likely represents TIA/amaurosis fugax.   Differential does include retinal migraine given his description of multiple colors, however the headaches he describes to me do not appear to be classical migraines and appear more to be tension type headaches.  If this were to recur, regular migraine will likely be the diagnosis.   Amaurosis fugax D/D retinal migraine  Acute Ischemic Stroke   Risk factors Etiology:   Recommendations #Transthoracic Echo  # Start patient on ASA 325mg  daily #Start or continue Atorvastatin 40 mg/other high intensity statin # BP goal: normotension # HBAIC and Lipid profile # Telemetry monitoring # Frequent neuro checks  Please page stroke NP  Or  PA  Or MD from 8am -4 pm  as this patient from this time will be  followed by the stroke.   You can look them up on [email or web address]  Password [facility]   Karena Addison Besse Miron MD [facility] [phone]   If 7pm to 7am, please call on call as listed on AMION.

## 2019-02-17 NOTE — H&P (Addendum)
Date: 02/17/2019               Patient Name:  Gabriel Hoover MRN: [id number]  DOB: [birth year] Age / Sex: 58 y.o., male   PCP: Unk Pinto, MD         Medical Service: Internal Medicine Teaching Service         Attending Physician: Dr. Charlesetta Shanks, MD    First Contact: Dr. Earlene Plater Pager: [phone]  Second Contact: Dr. Pearson Grippe Pager: [phone]       After Hours (After 5p/  First Contact  Pager: [phone]  weekends / holidays): Second Contact Pager: [phone]   Chief Complaint: Loss of vision in L eye  History of Present Illness: Gabriel Hoover is a 58 y.o male with a hx of T2DM and HTN who presented with acute vision loss of the L eye. The patient says he was shopping in [place] this morning, and while he was in line he had sudden and complete vision loss of the L eye. There was no warning signs that this was going to happen, and took him by surprise. This has never happened before in the past. Patient was fully aware what was going on, and was not confused at any point. The vision in the R eye was unaffected. He says his vision was completely lost for about 10 minutes before it started to slowly return over the course of about an hour. He says that he saw flashes of purple lights when his vision initially started to recover. He left [employer] then went to his eye doctor for help who did an eye exam and then referred him to the ED.  He doesn't have any vision complaints at this time.   The patient denied having a headache, jaw pain, eye pain, chest pain, palpitations, SOB or recent changes in his bowel and bladder habits. No history of clotting   Of note, the patient has severe needle anxiety and general history of being noncompliant with medical personal due to this anxiety. He is proscribed Xanex 0.79m 1 to 2 tablets before blood draws with PCP. The patient says when he was 6 a vaccine needle broke and was stuck in his skin. Since then he has been afraid of needles.   Meds:   Current Meds  Medication Sig  . valsartan-hydrochlorothiazide (DIOVAN-HCT) 320-25 MG tablet Start 1/2 tab daily; increase to full tab in 3 weeks for BP goal <150/90. (Patient taking differently: Take 1 tablet by mouth daily. )    Allergies: Allergies as of 02/17/2019 - Review Complete 02/17/2019  Allergen Reaction Noted  . Atenolol Other (See Comments) 05/29/2013   Past Medical History:  Diagnosis Date  . Anxiety   . Hyperlipidemia   . Hypertension   . Migraines   . Prediabetes   . Vitamin D deficiency    Previous Hospitalizations: None   Surgical History: Lasik eye surgery 25 years ago  Family History: Father - HTN, quadruple bipass surgery, dementia Denies family hx of clotting  Social History:  Does not use tobacco, drink alcohol or use illicit drugs Lives at home with Wife, THelene Kelp([phone] Has 2 grown children who live on their own.  Review of Systems: A complete ROS was negative except as per HPI.  Physical Exam: Blood pressure (!) 183/121, pulse 88, temperature 98.6 F (37 C), temperature source Oral, resp. rate 14, SpO2 97 %. Physical Exam Vitals signs reviewed.  Constitutional:      General: He is not in  acute distress.    Appearance: Normal appearance. He is normal weight. He is not ill-appearing, toxic-appearing or diaphoretic.  HENT:     Head: Normocephalic and atraumatic.     Nose: No rhinorrhea.     Mouth/Throat:     Mouth: Mucous membranes are moist.     Pharynx: Oropharynx is clear. No oropharyngeal exudate or posterior oropharyngeal erythema.  Eyes:     General: Lids are normal. Lids are everted, no foreign bodies appreciated. Vision grossly intact. Gaze aligned appropriately. No visual field deficit or scleral icterus.       Right eye: No foreign body or discharge.        Left eye: No foreign body or discharge.     Extraocular Movements: Extraocular movements intact.     Right eye: Normal extraocular motion and no nystagmus.     Left  eye: Normal extraocular motion and no nystagmus.     Conjunctiva/sclera: Conjunctivae normal.     Right eye: Right conjunctiva is not injected. No exudate or hemorrhage.    Left eye: Left conjunctiva is not injected. No exudate or hemorrhage.    Pupils: Pupils are equal, round, and reactive to light. Pupils are equal.  Neck:     Musculoskeletal: Normal range of motion and neck supple. No muscular tenderness.  Cardiovascular:     Rate and Rhythm: Normal rate and regular rhythm.     Pulses: Normal pulses.     Heart sounds: Normal heart sounds. No murmur. No friction rub. No gallop.   Pulmonary:     Effort: Pulmonary effort is normal. No respiratory distress.     Breath sounds: Normal breath sounds. No wheezing or rales.  Abdominal:     General: Abdomen is flat. Bowel sounds are normal. There is no distension.     Palpations: Abdomen is soft.     Tenderness: There is no abdominal tenderness. There is no guarding.  Musculoskeletal:        General: No swelling or tenderness.     Right lower leg: No edema.     Left lower leg: No edema.     Comments: Strength 5/5 with flexion and extension in all extremities bilaterally  Skin:    General: Skin is warm and dry.     Coloration: Skin is not jaundiced.     Findings: No bruising or lesion.  Neurological:     General: No focal deficit present.     Mental Status: He is alert and oriented to person, place, and time.     Cranial Nerves: No cranial nerve deficit.     Sensory: No sensory deficit.     Motor: No weakness.     Coordination: Coordination normal.     Gait: Gait normal.     Deep Tendon Reflexes: Reflexes normal.     Comments: Negative Romberg  Negative heel to shin bilaterally    Psychiatric:        Mood and Affect: Mood normal.        Behavior: Behavior normal.        Thought Content: Thought content normal.        Judgment: Judgment normal.     Comments: Talkative    Echocardiogram: Pending   CT Angio Head: IMPRESSION:  1. No emergent large vessel occlusion or hemodynamically significant stenosis by NASCET criteria. 2. Mild bilateral carotid bifurcation atherosclerosis. 3. Mild narrowing of the left vertebral artery origin.  MRI Brain WO Contrast: Pending  Assessment & Plan by Problem: Active  Problems:   * No active hospital problems. *  #L sided vision loss - Amaurosis fugax vs. Atypical Occular Migraine vs. TIA vs. Hypertensive Emergency vs. Giant Cell Arteritis  The patient's presentation of painless, sudden and complete vision loss followed by seeing purple streaks with resolution of his symptoms over the course of an hour seems consistent with retinal ischemia secondary to amaurosis fugax. Atypical occular migraine should also be considered because of the loss of vision and the patient experiencing purple streaks, however I would have expected the visual defects to proceed the vision loss. The patient noted that he did not have any headache, jaw pain, or nausea before, during or after the vision loss. Giant Cell Arteritis is low on the differential because of this, but still should be considered. - Secondary stroke workup   - CTA Head  - MRI brain  - Carotid doppler   - Echocardiogram - CBC, BMP, CRP, ESR, Lipid Panel, TSH - Considering hypercoagulability panel pending the results of other labs. - Opthalmology consult - Discussed case with Dr. Posey Pronto.  #HTN: Patient has elevated pressures on admission (systolics in the 747B during our exam) but is completely asymptomatic. No visual issues, headaches, or nausea noted. The patient notes his BP is generally in the 170s or 180s at home.  - IV labetalol 35m Q2hr PRN  - d/c home Valsartan-HCTZ 320-25 mg and amlodipine 10 mg  #T2DM: on metformin 500 mg daily at home - d/c metformin - start SSI   Dispo: Admit patient to Inpatient with expected length of stay greater than 2 midnights.   FULL CODE  Wife, THelene Kelp([phone]  Signed: AEarlene Plater MD 02/17/2019, 7:30 PM  Pager: [phone]

## 2019-02-17 NOTE — ED Notes (Signed)
Two nurse attempted to speak with patient regarding obtaining and IV and blood. Patient stated multiple times and spoke with the doctor regarding he is nervous about IV's and injections and just cannot have this done until he receives Xanax and wait 20 minutes then attempt IV and blood draw.

## 2019-02-18 ENCOUNTER — Inpatient Hospital Stay (HOSPITAL_COMMUNITY): Payer: BC Managed Care – PPO

## 2019-02-18 DIAGNOSIS — Z888 Allergy status to other drugs, medicaments and biological substances status: Secondary | ICD-10-CM

## 2019-02-18 DIAGNOSIS — Z7984 Long term (current) use of oral hypoglycemic drugs: Secondary | ICD-10-CM

## 2019-02-18 DIAGNOSIS — E119 Type 2 diabetes mellitus without complications: Secondary | ICD-10-CM

## 2019-02-18 DIAGNOSIS — E876 Hypokalemia: Secondary | ICD-10-CM

## 2019-02-18 DIAGNOSIS — Z79899 Other long term (current) drug therapy: Secondary | ICD-10-CM

## 2019-02-18 DIAGNOSIS — I10 Essential (primary) hypertension: Secondary | ICD-10-CM

## 2019-02-18 DIAGNOSIS — I340 Nonrheumatic mitral (valve) insufficiency: Secondary | ICD-10-CM

## 2019-02-18 DIAGNOSIS — G453 Amaurosis fugax: Principal | ICD-10-CM

## 2019-02-18 DIAGNOSIS — R51 Headache: Secondary | ICD-10-CM

## 2019-02-18 LAB — ECHOCARDIOGRAM COMPLETE
Height: 68 in
Weight: 2927.71 oz

## 2019-02-18 MED ORDER — ASPIRIN 81 MG PO TBEC: 81.0000 mg | DELAYED_RELEASE_TABLET | Freq: Every day | ORAL | 0 refills | Status: DC

## 2019-02-18 MED ORDER — CLOPIDOGREL BISULFATE 75 MG PO TABS
75.0000 mg | ORAL_TABLET | Freq: Every day | ORAL | Status: DC
  Administered 2019-02-18: 75 mg via ORAL
  Filled 2019-02-18: qty 1

## 2019-02-18 MED ORDER — ATORVASTATIN CALCIUM 40 MG PO TABS
40.0000 mg | ORAL_TABLET | Freq: Every day | ORAL | Status: DC
  Administered 2019-02-18: 40 mg via ORAL
  Filled 2019-02-18: qty 1

## 2019-02-18 MED ORDER — POTASSIUM CHLORIDE CRYS ER 20 MEQ PO TBCR
40.0000 meq | EXTENDED_RELEASE_TABLET | Freq: Once | ORAL | Status: AC
  Administered 2019-02-18: 40 meq via ORAL
  Filled 2019-02-18: qty 2

## 2019-02-18 MED ORDER — AMLODIPINE BESYLATE 10 MG PO TABS: ORAL_TABLET | ORAL | 0 refills | Status: DC

## 2019-02-18 MED ORDER — ATORVASTATIN CALCIUM 40 MG PO TABS: 40.0000 mg | ORAL_TABLET | Freq: Every day | ORAL | 0 refills | Status: DC

## 2019-02-18 MED ORDER — VALSARTAN-HYDROCHLOROTHIAZIDE 320-25 MG PO TABS: 1.0000 | ORAL_TABLET | Freq: Every day | ORAL | 0 refills | Status: DC

## 2019-02-18 MED ORDER — CLOPIDOGREL BISULFATE 75 MG PO TABS: 75.0000 mg | ORAL_TABLET | Freq: Every day | ORAL | 0 refills | Status: AC

## 2019-02-18 MED ORDER — INSULIN ASPART 100 UNIT/ML ~~LOC~~ SOLN: 0.0000 [IU] | Freq: Three times a day (TID) | SUBCUTANEOUS | Status: DC

## 2019-02-18 MED ORDER — ASPIRIN EC 81 MG PO TBEC
81.0000 mg | DELAYED_RELEASE_TABLET | Freq: Every day | ORAL | Status: DC
  Administered 2019-02-18: 81 mg via ORAL
  Filled 2019-02-18: qty 1

## 2019-02-18 NOTE — Progress Notes (Signed)
  Echocardiogram 2D Echocardiogram has been performed.  Gabriel Hoover 02/18/2019, 10:15 AM

## 2019-02-18 NOTE — Discharge Instructions (Signed)
Amaurosis Fugax Amaurosis fugax is a condition in which you temporarily lose sight in one eye. The loss of vision in the affected eye may be total or partial. The vision loss usually lasts for only a few seconds or minutes before sight returns to normal. Occasionally, it may last for several hours. This condition is caused by interruption of blood flow in the artery that supplies blood to the retina. The retina is the part of your eye that contains the nerves needed for sight. This condition can be a warning sign of a stroke. A stroke can result in permanent vision loss or loss of other body functions. What are the causes? This condition is caused by a loss or interruption of blood flow to the retinal artery. Causes for the change in blood flow include:  Atherosclerosis, which is a buildup of cholesterol and fats (plaque). If some of that plaque breaks off and gets into the bloodstream, it can travel (embolize) to other blood vessels, such as the retinal artery.  Diseases of the heart valves.  Certain diseases of the blood, such as sickle cell anemia and leukemia.  Blood clotting (coagulation) disorders.  Inflammation of the arteries (vasculitis).  An irregular heartbeat, such as atrial fibrillation. What increases the risk? The following factors may make you more likely to develop this condition:  Use of any tobacco products, including cigarettes, chewing tobacco, or electronic cigarettes.  Poorly controlled diabetes.  Heart disease.  High blood pressure (hypertension).  High cholesterol.  Excessive alcohol use.  Use of illegal drugs, especially cocaine.  Age. The risk increases with age.  Family history of stroke. What are the signs or symptoms? The main symptom of this condition is painless, sudden loss of vision in one eye. The vision loss often starts at the top and moves down, as if a curtain is being pulled down over your eye. This is usually followed by a quick return of  vision. However, symptoms may last for several hours. It is important to seek medical care right away even if your symptoms go away. How is this diagnosed? This condition is diagnosed by:  Medical history and physical exam.  Eye exam.  Carotid ultrasound. This checks to see if plaque has built up in the carotid arteries in your neck.  Magnetic resonance angiography (MRA). This checks the anatomy of the carotid artery and the branches that supply the brain. It looks for areas of blockage or disease. You may also have blood tests, an electrocardiogram (ECG) to check your heart rhythm, and an echocardiogram (ECHO) to check your heart function. How is this treated? Emergency treatment for this condition may involve massaging the eyeball or using certain breathing techniques to remove or ease the blockage of the retinal artery. Other treatments for this condition focuses on reducing your risk of having a stroke in the future. This may include:  Medicines. You may be given medicines to control high blood pressure, manage diabetes, or manage cholesterol, if needed. Blood thinning medicines may also be prescribed.  Procedures to either remove plaque in your carotid arteries or widen carotid arteries that have become narrow due to plaque. These may include: ? Carotid endarterectomy. This is a surgical procedure that removes plaque from the carotid artery. ? Carotid angioplasty and stenting. In this procedure, a thin tube with a balloon is placed into the artery and is then inflated to open the blocked portion of the artery. A stent can be placed to keep that section of the artery  open.  Lifestyle changes to reduce your risk of having a stroke. These can include changing your diet and getting enough exercise. It is possible that you may not need any immediate treatment. Follow these instructions at home: Eating and drinking  Eat a diet that includes five or more servings of fruits and vegetables each  day. This may reduce the risk of stroke. Certain diets may be recommended to deal with high blood pressure, high cholesterol, diabetes, or obesity.  A diet that is low in sodium is recommended to manage high blood pressure.  A high-fiber diet that is low in saturated fat, trans fat, and cholesterol may control cholesterol levels.  A low-carbohydrate, low-sugar diet is recommended to manage diabetes.  A reduced-calorie diet that is low in sodium, saturated fat, trans fat, and cholesterol is recommended to manage obesity. Lifestyle  Maintain a healthy weight.  Stay physically active. It is recommended that you get at least 30 minutes of activity on most or all days.  Do not use any products that contain nicotine or tobacco, such as cigarettes and e-cigarettes. If you need help quitting, ask your health care provider.  Limit alcohol intake to no more than 1 drink a day for nonpregnant women and 2 drinks a day for men. One drink equals 12 oz of beer, 5 oz of wine, or 1 oz of hard liquor.  Do not abuse drugs. General instructions  Take over-the-counter and prescription medicines only as told by your health care provider. Follow the directions carefully. Make sure that you understand all of your medicine instructions. Medicines may be used to control the risk factors of stroke.  Keep all follow-up visits as told by your health care provider. This is important. Contact a health care provider if:  You lose vision in one eye or both eyes. Get help right away if:  You have chest pain or an irregular heartbeat.  You have any symptoms of stroke. The acronym BEFAST is an easy way to remember the main warning signs of stroke. ? B = Balance problems. Signs include dizziness, sudden trouble walking, or loss of balance. ? E = Eye problems. This includes trouble seeing or a sudden change in vision. ? F = Face changes. This includes sudden weakness or numbness of the face, or the face or eyelid  drooping to one side. ? A = Arm weakness or numbness. This happens suddenly and usually on one side of the body. ? S = Speech problems. This includes trouble speaking or trouble understanding. ? T = Time. Time to call 911 or seek emergency care. Do not wait to see if symptoms will go away. Make note of the time your symptoms started.  Other signs of stroke may include: ? A sudden, severe headache with no known cause. ? Nausea or vomiting. ? Seizure. These symptoms may represent a serious problem that is an emergency. Do not wait to see if the symptoms will go away. Get medical help right away. Call your local emergency services (911 in the [place] Do not drive yourself to the hospital. Summary  Amaurosis fugax is a condition in which you temporarily lose your sight in one eye.  This condition can be a warning sign of a stroke. A stroke can result in permanent vision loss or loss of other body functions.  Seek medical care right away even if your symptoms go away. This information is not intended to replace advice given to you by your health care provider. Make sure  you discuss any questions you have with your health care provider. Document Released: 05/08/2008 Document Revised: 02/03/2018 Document Reviewed: 08/02/2016 Elsevier Patient Education  2020 [employer].

## 2019-02-18 NOTE — Progress Notes (Signed)
STROKE TEAM PROGRESS NOTE   INTERVAL HISTORY I have personally reviewed history of presenting illness with the patient.  He developed sudden onset of pain in his left eye loss of vision which gradually improved over an hour.  He denies any prior history of strokes or TIAs.  MRI scan of the brain is negative for acute infarct and CT angiogram showed no significant stenosis of large intracranial or extracranial vessels.  LDL cholesterol is elevated 148 and hemoglobin A1c at 9.4 mg percent.  Transthoracic echo is unremarkable  Vitals:   02/17/19 2334 02/18/19 0338 02/18/19 0725 02/18/19 1221  BP: (!) 170/110 (!) 139/92 (!) 161/110 (!) 171/105  Pulse: 65 62 (!) 55 (!) 55  Resp: 18 18 20    Temp:  98 F (36.7 C) 97.9 F (36.6 C) 97.9 F (36.6 C)  TempSrc:  Oral Oral Oral  SpO2: 92% 97% 98% 98%  Weight:      Height:        CBC:  Recent Labs  Lab 02/17/19 1500 02/17/19 1518  WBC 12.2*  --   NEUTROABS 9.9*  --   HGB 16.3 15.6  HCT 45.9 46.0  MCV 82.3  --   PLT 278  --     Basic Metabolic Panel:  Recent Labs  Lab 02/17/19 1500 02/17/19 1518  NA 137 137  K 3.3* 3.3*  CL 101 100  CO2 25  --   GLUCOSE 237* 237*  BUN 12 13  CREATININE 1.15 0.90  CALCIUM 9.2  --    Lipid Panel:     Component Value Date/Time   CHOL 230 (H) 07/02/2018 1030   TRIG 249 (H) 07/02/2018 1030   HDL 44 07/02/2018 1030   CHOLHDL 5.2 (H) 07/02/2018 1030   VLDL 51 (H) 07/18/2015 1727   LDLCALC 148 (H) 07/02/2018 1030   HgbA1c:  Lab Results  Component Value Date   HGBA1C 9.4 (H) 07/02/2018   Urine Drug Screen:     Component Value Date/Time   LABOPIA NONE DETECTED 02/17/2019 1343   COCAINSCRNUR NONE DETECTED 02/17/2019 1343   LABBENZ NONE DETECTED 02/17/2019 1343   AMPHETMU NONE DETECTED 02/17/2019 1343   THCU NONE DETECTED 02/17/2019 1343   LABBARB NONE DETECTED 02/17/2019 1343    Alcohol Level     Component Value Date/Time   ETH <10 02/17/2019 1500    IMAGING Ct Angio Head W Or Wo  Contrast  Result Date: 02/17/2019 CLINICAL DATA:  Left eye vision loss. EXAM: CT ANGIOGRAPHY HEAD AND NECK TECHNIQUE: Multidetector CT imaging of the head and neck was performed using the standard protocol during bolus administration of intravenous contrast. Multiplanar CT image reconstructions and MIPs were obtained to evaluate the vascular anatomy. Carotid stenosis measurements (when applicable) are obtained utilizing NASCET criteria, using the distal internal carotid diameter as the denominator. CONTRAST:  175mL OMNIPAQUE IOHEXOL 350 MG/ML SOLN COMPARISON:  None. FINDINGS: CT HEAD FINDINGS Brain: There is no mass, hemorrhage or extra-axial collection. The size and configuration of the ventricles and extra-axial CSF spaces are normal. There is no acute or chronic infarction. The brain parenchyma is normal. Skull: The visualized skull base, calvarium and extracranial soft tissues are normal. Sinuses/Orbits: No fluid levels or advanced mucosal thickening of the visualized paranasal sinuses. No mastoid or middle ear effusion. The orbits are normal. CTA NECK FINDINGS SKELETON: There is no bony spinal canal stenosis. No lytic or blastic lesion. OTHER NECK: Normal pharynx, larynx and major salivary glands. No cervical lymphadenopathy. Heterogeneous thyroid gland. UPPER  CHEST: No pneumothorax or pleural effusion. No nodules or masses. AORTIC ARCH: There is no calcific atherosclerosis of the aortic arch. There is no aneurysm, dissection or hemodynamically significant stenosis of the visualized ascending aorta and aortic arch. Conventional 3 vessel aortic branching pattern. The visualized proximal subclavian arteries are widely patent. RIGHT CAROTID SYSTEM: --Common carotid artery: Widely patent origin without common carotid artery dissection or aneurysm. --Internal carotid artery: No dissection, occlusion or aneurysm. Mild atherosclerotic calcification at the carotid bifurcation without hemodynamically significant  stenosis. --External carotid artery: No acute abnormality. LEFT CAROTID SYSTEM: --Common carotid artery: Widely patent origin without common carotid artery dissection or aneurysm. --Internal carotid artery: No dissection, occlusion or aneurysm. Mild atherosclerotic lack at the carotid bifurcation without hemodynamically significant stenosis. --External carotid artery: No acute abnormality. VERTEBRAL ARTERIES: Codominant configuration. Mild narrowing at the left vertebral artery origin. No dissection, occlusion or flow-limiting stenosis to the skull base (V1-V3 segments). CTA HEAD FINDINGS POSTERIOR CIRCULATION: --Vertebral arteries: Normal V4 segments. --Posterior inferior cerebellar arteries (PICA): Patent origins from the vertebral arteries. --Anterior inferior cerebellar arteries (AICA): Patent origins from the basilar artery. --Basilar artery: Normal. --Superior cerebellar arteries: Normal. --Posterior cerebral arteries (PCA): Normal. Both originate from the basilar artery. Posterior communicating arteries (p-comm) are diminutive or absent. ANTERIOR CIRCULATION: --Intracranial internal carotid arteries: Normal. --Anterior cerebral arteries (ACA): Normal. Both A1 segments are present. Patent anterior communicating artery (a-comm). --Middle cerebral arteries (MCA): Normal. VENOUS SINUSES: As permitted by contrast timing, patent. ANATOMIC VARIANTS: None Review of the MIP images confirms the above findings. IMPRESSION: 1. No emergent large vessel occlusion or hemodynamically significant stenosis by NASCET criteria. 2. Mild bilateral carotid bifurcation atherosclerosis. 3. Mild narrowing of the left vertebral artery origin. Electronically Signed   By: Ulyses Jarred M.D.   On: 02/17/2019 16:38   Ct Angio Neck W And/or Wo Contrast  Result Date: 02/17/2019 CLINICAL DATA:  Left eye vision loss. EXAM: CT ANGIOGRAPHY HEAD AND NECK TECHNIQUE: Multidetector CT imaging of the head and neck was performed using the standard  protocol during bolus administration of intravenous contrast. Multiplanar CT image reconstructions and MIPs were obtained to evaluate the vascular anatomy. Carotid stenosis measurements (when applicable) are obtained utilizing NASCET criteria, using the distal internal carotid diameter as the denominator. CONTRAST:  133mL OMNIPAQUE IOHEXOL 350 MG/ML SOLN COMPARISON:  None. FINDINGS: CT HEAD FINDINGS Brain: There is no mass, hemorrhage or extra-axial collection. The size and configuration of the ventricles and extra-axial CSF spaces are normal. There is no acute or chronic infarction. The brain parenchyma is normal. Skull: The visualized skull base, calvarium and extracranial soft tissues are normal. Sinuses/Orbits: No fluid levels or advanced mucosal thickening of the visualized paranasal sinuses. No mastoid or middle ear effusion. The orbits are normal. CTA NECK FINDINGS SKELETON: There is no bony spinal canal stenosis. No lytic or blastic lesion. OTHER NECK: Normal pharynx, larynx and major salivary glands. No cervical lymphadenopathy. Heterogeneous thyroid gland. UPPER CHEST: No pneumothorax or pleural effusion. No nodules or masses. AORTIC ARCH: There is no calcific atherosclerosis of the aortic arch. There is no aneurysm, dissection or hemodynamically significant stenosis of the visualized ascending aorta and aortic arch. Conventional 3 vessel aortic branching pattern. The visualized proximal subclavian arteries are widely patent. RIGHT CAROTID SYSTEM: --Common carotid artery: Widely patent origin without common carotid artery dissection or aneurysm. --Internal carotid artery: No dissection, occlusion or aneurysm. Mild atherosclerotic calcification at the carotid bifurcation without hemodynamically significant stenosis. --External carotid artery: No acute abnormality. LEFT CAROTID SYSTEM: --Common  carotid artery: Widely patent origin without common carotid artery dissection or aneurysm. --Internal carotid  artery: No dissection, occlusion or aneurysm. Mild atherosclerotic lack at the carotid bifurcation without hemodynamically significant stenosis. --External carotid artery: No acute abnormality. VERTEBRAL ARTERIES: Codominant configuration. Mild narrowing at the left vertebral artery origin. No dissection, occlusion or flow-limiting stenosis to the skull base (V1-V3 segments). CTA HEAD FINDINGS POSTERIOR CIRCULATION: --Vertebral arteries: Normal V4 segments. --Posterior inferior cerebellar arteries (PICA): Patent origins from the vertebral arteries. --Anterior inferior cerebellar arteries (AICA): Patent origins from the basilar artery. --Basilar artery: Normal. --Superior cerebellar arteries: Normal. --Posterior cerebral arteries (PCA): Normal. Both originate from the basilar artery. Posterior communicating arteries (p-comm) are diminutive or absent. ANTERIOR CIRCULATION: --Intracranial internal carotid arteries: Normal. --Anterior cerebral arteries (ACA): Normal. Both A1 segments are present. Patent anterior communicating artery (a-comm). --Middle cerebral arteries (MCA): Normal. VENOUS SINUSES: As permitted by contrast timing, patent. ANATOMIC VARIANTS: None Review of the MIP images confirms the above findings. IMPRESSION: 1. No emergent large vessel occlusion or hemodynamically significant stenosis by NASCET criteria. 2. Mild bilateral carotid bifurcation atherosclerosis. 3. Mild narrowing of the left vertebral artery origin. Electronically Signed   By: Ulyses Jarred M.D.   On: 02/17/2019 16:38   Mr Brain Wo Contrast  Result Date: 02/17/2019 CLINICAL DATA:  Initial evaluation for acute left-sided visual loss. EXAM: MRI HEAD WITHOUT CONTRAST TECHNIQUE: Multiplanar, multiecho pulse sequences of the brain and surrounding structures were obtained without intravenous contrast. COMPARISON:  Prior CT and CTA from earlier the same day. FINDINGS: Brain: Cerebral volume within normal limits for patient age. Patchy  T2/FLAIR hyperintensity within the periventricular and deep white matter both cerebral hemispheres most consistent with chronic small vessel ischemic disease, mild to moderate in nature. No abnormal foci of restricted diffusion to suggest acute or subacute ischemia. Gray-white matter differentiation well maintained. No encephalomalacia to suggest chronic infarction. No evidence for acute intracranial hemorrhage. Few scattered chronic micro hemorrhages noted within the right basal ganglia, right thalamus, and cerebellum, most likely related to chronic underlying hypertension. No mass lesion, midline shift or mass effect. No hydrocephalus. No extra-axial fluid collection. Major dural sinuses are grossly patent. Pituitary gland and suprasellar region are normal. Midline structures intact and normal. Vascular: Major intracranial vascular flow voids well maintained and normal in appearance. Skull and upper cervical spine: Craniocervical junction normal. Visualized upper cervical spine within normal limits. Bone marrow signal intensity normal. No scalp soft tissue abnormality. Sinuses/Orbits: Globes and orbital soft tissues demonstrate no acute finding. Axial myopia noted. Paranasal sinuses are clear. No mastoid effusion. Inner ear structures normal. Other: None. IMPRESSION: 1. No acute intracranial infarct or other abnormality identified. 2. Underlying mild to moderate chronic microvascular ischemic disease. Otherwise unremarkable brain MRI for age. Electronically Signed   By: Jeannine Boga M.D.   On: 02/17/2019 20:16    PHYSICAL EXAM Pleasant middle-aged Caucasian male not in distress. . Afebrile. Head is nontraumatic. Neck is supple without bruit.    Cardiac exam no murmur or gallop. Lungs are clear to auscultation. Distal pulses are well felt. Neurological Exam ;  Awake  Alert oriented x 3. Normal speech and language.eye movements full without nystagmus.fundi were not visualized. Vision acuity and fields  appear normal. Hearing is normal. Palatal movements are normal. Face symmetric. Tongue midline. Normal strength, tone, reflexes and coordination. Normal sensation. Gait deferred.  ASSESSMENT/PLAN Gabriel Hoover is a 58 y.o. male with history of prediabetes, migraines, HTN, HLD and anxiety presenting with loss of vision in his  left eye with normal exam from ophthalmologist for concern for CRAO.    TIA  Amaurosis fugax left eye  CTA head & neck no LVO.  Mild B ICA bifurcation atherosclerosis.  Mild narrowing L VA origin.  MRI no acute abnormality.  Underlying mild to moderate small vessel disease.  2D Echo EF 60 to 65% with no source of embolus.  Intra-atrial septum appears lipomatous  LDL 148  HgbA1c 9.4  Lovenox 40 mg subcu daily for VTE prophylaxis  No antithrombotic prior to admission, now on aspirin 325 mg daily. Given TIA, recommend aspirin 81 mg and plavix 75 mg daily x 3 weeks, then aspirin alone. Orders adjusted.   Therapy recommendations: No therapy needs  Disposition: Return home  Hypertensive emergency  Difficult to control PTA  BP is high as 253/140 . BP goal normotensive  Hyperlipidemia  Home meds: No statin  Now on Lipitor 40  LDL 148, goal < 70  Continue statin at discharge  Diabetes type II Uncontrolled  HgbA1c 9.4, goal < 7.0  Close PCP follow-up   Other Stroke Risk Factors  Overweight, Body mass index is 27.82 kg/m., recommend weight loss, diet and exercise as appropriate   History of migraines  Hospital day # 1  I have personally obtained history,examined this patient, reviewed notes, independently viewed imaging studies, participated in medical decision making and plan of care.ROS completed by me personally and pertinent positives fully documented  I have made any additions or clarifications directly to the above note.  He presented with transient left eye vision loss and stroke work-up was essentially unremarkable except for  uncontrolled lipids and diabetes.  Recommend aspirin and Plavix for 3 weeks followed by aspirin alone.  Aggressive control of lipids and sugar.  Follow-up as an outpatient stroke clinic in 6 weeks.  Long discussion with patient and answered questions.  Greater than 50% time during this 25-minute visit was spent on counseling and coordination of care about TIA and stroke prevention risk discussion and answering questions  Antony Contras, MD Medical Director [place] Pager: [phone] 02/18/2019 4:10 PM   To contact Stroke Continuity provider, please refer to [email or web address] After hours, contact General Neurology

## 2019-02-18 NOTE — Progress Notes (Signed)
Subjective:   Patient was examined by the bedside this morning. He denies vision changes overnight. He states that he is feeling better overall today. Neurology saw the patient this morning. We discussed plans to discharge and updating his wife about his medical condition via phone this afternoon.   Objective:  Vital signs in last 24 hours: Vitals:   02/17/19 2134 02/17/19 2334 02/18/19 0338 02/18/19 0725  BP: (!) 207/136 (!) 170/110 (!) 139/92 (!) 161/110  Pulse: 60 65 62 (!) 55  Resp: _0 Temp: 98.3 F (36.8 C)  98 F (36.7 C) 97.9 F (36.6 C)  TempSrc: Oral  Oral Oral  SpO2: 98% 92% 97% 98%  Weight: 83 kg     Height: _1  (1.727 m)      Physical Exam Constitutional:      General: He is not in acute distress.    Appearance: He is normal weight. He is not ill-appearing, toxic-appearing or diaphoretic.  HENT:     Head: Normocephalic and atraumatic.  Eyes:     General: No scleral icterus.       Right eye: No discharge.        Left eye: No discharge.     Extraocular Movements: Extraocular movements intact.     Conjunctiva/sclera: Conjunctivae normal.     Pupils: Pupils are equal, round, and reactive to light.  Cardiovascular:     Rate and Rhythm: Normal rate and regular rhythm.     Pulses: Normal pulses.     Heart sounds: Normal heart sounds. No murmur. No friction rub. No gallop.   Pulmonary:     Effort: Pulmonary effort is normal. No respiratory distress.     Breath sounds: Normal breath sounds. No wheezing or rales.  Abdominal:     General: Abdomen is flat. Bowel sounds are normal. There is no distension.     Palpations: Abdomen is soft.     Tenderness: There is no abdominal tenderness. There is no guarding.  Musculoskeletal:        General: No swelling, tenderness or deformity.     Right lower leg: No edema.     Left lower leg: No edema.  Skin:    General: Skin is warm and dry.  Neurological:     General: No focal deficit present.     Mental  Status: He is alert and oriented to person, place, and time. Mental status is at baseline.     Cranial Nerves: No cranial nerve deficit.     Sensory: No sensory deficit.     Motor: No weakness.     Coordination: Coordination normal.  Psychiatric:        Mood and Affect: Mood normal.        Behavior: Behavior normal.        Thought Content: Thought content normal.        Judgment: Judgment normal.    Assessment/Plan:  Active Problems:   Amaurosis fugax  In summary, Mr. Damron is a 58 y.o male with a history of HTN on amlodipine and valsartan-HCTZ who presented to the ED on 7/7 after painless, sudden vision loss of L eye earlier that AM with gradual resolution of his vision loss in about an hr. The patient's presentation seems consistent with retinal ischemia secondary to amaurosis fugax. Atypical occular migraine should also be considered because of the loss of vision and the patient experiencing purple streaks, however I would have expected the visual defects to proceed the vision  loss. The patient noted that he did not have any headache, jaw pain, or nausea before, during or after the vision loss. Giant Cell Arteritis is low on the differential because of this, but still should be considered.  #L sided vision loss - Amaurosis fugax vs. Atypical Occular Migraine vs. TIA vs. Hypertensive Emergency vs. Giant Cell Arteritis - Secondary stroke w/u was initiated. CTA head MRI brain no remarkable findings for an acute process. Waiting on Carotid doppler and echocardiogram results. CBC,  -f/u Carotid doppler & Echocardiogram - CBC, BMP, CRP, ESR, Lipid Panel, TSH - Considering hypercoagulability panel pending the results of other labs. - Opthalmology consult - Discussed case with Dr. Posey Pronto. - Atorvastatin 40 mg daily   #HTN: Patient has elevated pressures on admission (systolics in the 176H during our exam) but is completely asymptomatic. No visual issues, headaches, or nausea noted. The patient  notes his BP is generally in the 170s or 180s at home.  - IV labetalol 63m Q2hr PRN  - d/c home Valsartan-HCTZ 320-25 mg and amlodipine 10 mg  #T2DM: Glucose 237 on admission, last A1c from 06/2018 was 9.4. - Metformin 500 mg at home was d/c on admission - SSI while inpatient  #Hypkalemia: Potassium 3.3 on initial BMP - Replete today   #FEN/GI - carb modified diet   #DVT prophylaxis - Lovenox 456minjections   Dispo: Anticipated discharge in approximately today. Wife TeHelene [phone] PlaterMD 02/18/2019, 2:22 PM Pager: [phone]

## 2019-02-18 NOTE — Progress Notes (Signed)
Pt refused blood sugar check. States he has a fear of needles and does not want it checked at this time. Nurse will continue to monitor. [place]

## 2019-02-18 NOTE — Progress Notes (Addendum)
Patient refused lab work and CBG check. Dr. Earlene Plater notified. Nurse will continue to monitor [employer]

## 2019-02-18 NOTE — Progress Notes (Signed)
Patient is discharging home. All discharge paperwork went over with patient. All questions and concerns addressed. IV taken out. All belongings sent with patient. Wife here for transport. Patient taken down in wheelchair. [place]

## 2019-02-18 NOTE — Discharge Summary (Addendum)
Name: Gabriel Hoover MRN: [id number] DOB: 22-Nov-1960 58 y.o. PCP: Unk Pinto, MD  Date of Admission: 02/17/2019 11:52 AM Date of Discharge: 02/18/19 Attending Physician: Oda Kilts, MD  Discharge Diagnosis: 1. Amaurosis fugax 2. HTN 3. T2DM  Discharge Medications: Allergies as of 02/18/2019       Reactions   Atenolol Other (See Comments)   Made tired        Medication List     TAKE these medications    amLODipine 10 MG tablet Commonly known as: NORVASC Defer starting medication until titrated up to valsartan/hctz full tab and tolerating. Then start 1/2 tab daily for 2 weeks, then full tab.   aspirin 81 MG EC tablet Take 1 tablet (81 mg total) by mouth daily. Start taking on: February 19, 2019   atorvastatin 40 MG tablet Commonly known as: LIPITOR Take 1 tablet (40 mg total) by mouth daily at 6 PM.   clopidogrel 75 MG tablet Commonly known as: PLAVIX Take 1 tablet (75 mg total) by mouth daily for 21 days. Start taking on: February 19, 2019   metFORMIN 500 MG 24 hr tablet Commonly known as: GLUCOPHAGE-XR Start 1 tab daily with largest meal of the day for 2 weeks, then add to second largest meal of the day.   valsartan-hydrochlorothiazide 320-25 MG tablet Commonly known as: DIOVAN-HCT Take 1 tablet by mouth daily.        Disposition and follow-up:   Gabriel Hoover was discharged from [facility] in stable condition.  At the hospital follow up visit please address:  1. L sided Amaurosis fugax: Pt had sudden, painless complete L eye vision loss when standing in line at [facility] that lasted about 10 minutes. Eyesight improved steadily and was fully restored after 1 hr.  2. HTN: Pt had elevated pressures on admission (systolics in the 622W during initial exam) but was asymptomatic. No visual issues, headaches, or nausea noted.  3. T2DM: A1c 9.4 in November 2019.  4.  Labs / imaging needed at time of follow-up: Hgb A1c, BMP, Lipid panel  5.   Pending labs/ test needing follow-up: none  Follow-up Appointments: Follow-up Information     [facility] Follow up in 4 week(s).   Specialty: Neurology Why: Stroke clinic.  Office will call with appointment date and time. Contact information: [address] [place] [phone]        Unk Pinto, MD. Schedule an appointment as soon as possible for a visit in 1 week(s).   Specialty: Internal Medicine Contact information: [address] [place] [address] [place] [facility] Course by problem list: 58 y.o male with a history of HTN admitted on 7/7 after painless, sudden vision loss of L eye, consistent with retinal ischemia secondary to amaurosis fugax. Atypical occular migraine also considered, however would have expected the visual artifacts to precede the vision loss. No HA or jaw pain to suggest GCA.   L sided Amaurosis fugax: Secondary stroke workup was initiated. CTA head & neck, MRI brain, and echocardiogram had no remarkable findings. Discussed case with Dr. Posey Pronto with ophthalmology. Started on atorvastatin 40 mg daily    HTN: Patient had elevated pressures on admission (systolics in the 144Y during our exam) but was completely asymptomatic. No visual issues, headaches, or nausea noted. The patient notes his BP is generally in the 170s or 180s at home.  - Received one dose  of IV labetalol 10mg  for elevated pressures on admission  - Held home Valsartan-HCTZ 320-25 mg and amlodipine 10 mg during stroke workup, resumed on discharge, will need close follow up of BP and titration for improved control   T2DM: Glucose 237 on admission, last A1c from 06/2018 was 9.4. - SSI while inpatient - Metformin 500 mg at home was held on admission and restarted on discharge.  Discharge Vitals:   BP (!) 179/114 (BP Location: Left Arm) Comment: Rn notified  Pulse (!) 58   Temp 99.1 F (37.3 C)  (Oral)   Resp 20   Ht 5\' 8"  (1.727 m)   Wt 83 kg   SpO2 97%   BMI 27.82 kg/m   Pertinent Labs, Studies, and Procedures:  CT Angio Head & Neck IMPRESSION: 1. No emergent large vessel occlusion or hemodynamically significant stenosis by NASCET criteria. 2. Mild bilateral carotid bifurcation atherosclerosis. 3. Mild narrowing of the left vertebral artery origin.  MRI Brain IMPRESSION: 1. No acute intracranial infarct or other abnormality identified. 2. Underlying mild to moderate chronic microvascular ischemic disease. Otherwise unremarkable brain MRI for age.  Echocardiogram IMPRESSIONS  1. The left ventricle has normal systolic function with an ejection fraction of 60-65%. The cavity size was normal. There is moderately increased left ventricular wall thickness. Left ventricular diastolic Doppler parameters are consistent with impaired  relaxation. Elevated left ventricular end-diastolic pressure No evidence of left ventricular regional wall motion abnormalities.  2. The right ventricle has normal systolic function. The cavity was normal. There is no increase in right ventricular wall thickness.  3. The aortic valve is tricuspid. Mild sclerosis of the aortic valve. Aortic valve regurgitation was not assessed by color flow Doppler.  4. The interatrial septum appears to be lipomatous.  Discharge Instructions: Discharge Instructions     Ambulatory referral to Neurology   Complete by: As directed    Follow up with stroke clinic NP (Jessica Vanschaick or Cecille Rubin, if both not available, consider Dr. Antony Contras, Dr. Bess Harvest, or Dr. Sarina Ill) at [facility] in about 4 weeks.   Call MD for:  difficulty breathing, headache or visual disturbances   Complete by: As directed    Call MD for:  persistant dizziness or light-headedness   Complete by: As directed    Call MD for:  persistant nausea and vomiting   Complete by: As directed    Diet - low  sodium heart healthy   Complete by: As directed    Discharge instructions   Complete by: As directed    It was a pleasure to take care of you Gabriel Hoover. During your hospitalization you were taken care of for a possible stroke. Please make sure you take all the medications as indicated on this form.   1) Please take atorvastatin 40mg  daily  2) Aspirin and plavix for 3 weeks and then aspirin alone thereafter  3) follow up with your primary care provider for diabetes management within one week, continue to take metformin in the meantime  4) follow up with neurology   Increase activity slowly   Complete by: As directed       Signed: Earlene Plater, MD Internal Medicine, PGY1 Pager: [phone]  02/19/2019,12:20 PM

## 2019-02-24 NOTE — Progress Notes (Signed)
History of Present Illness:       This very  But poorly compliant  58 y.o. MWM presents for ER follow up after admitted overnight 7/7-7/8/20290 with severe labile HTN and Left Amaurosis Fugax.CTA head & neck, MRI brain, and echocardiogram were all Negative  with HTN, HLD, Pre-Diabetes and Vitamin D Deficiency.       Patient is treated for HTN (2008) sporadically taking meds. . Today's BP was elevated at 152/84 by the nurse and repeated rechecks by myself with digital wrist & arm cuff ans also with the wall cuff yielded readings of 228-240/129-130. Patient was advised evaluation and treatment in the ER , but he adamantly refused. Patient has no current complaints of any cardiac type chest pain, palpitations, dyspnea / orthopnea / PND, dizziness, claudication, or dependent edema.      Hyperlipidemia has not been controlled with diet and he has not taken meds as advised.  Last Lipids were not at goal: Lab Results  Component Value Date   CHOL 230 (H) 07/02/2018   HDL 44 07/02/2018   LDLCALC 148 (H) 07/02/2018   TRIG 249 (H) 07/02/2018   CHOLHDL 5.2 (H) 07/02/2018       Also, the patient has history of PreDiabetes (A1c 5.8% & 6.2% / 2014) and then transitioned to NIDDM (A1c 6.5% / 2015) and has had no symptoms of reactive hypoglycemia, diabetic polys, paresthesias or visual blurring. He does not take medications for his Diabetes as repeatedly advised.  Last A1c was not at goal: Lab Results  Component Value Date   HGBA1C 9.4 (H) 07/02/2018       Further, the patient also has history of Vitamin D Deficiency ("23" / 2013) and supplements vitamin D without any suspected side-effects. Last vitamin D was still very low:  Lab Results  Component Value Date   VD25OH 23 (L) 07/02/2018   Current Outpatient Medications on File Prior to Visit  Medication Sig  . aspirin EC 81 MG EC tablet Take 1 tablet (81 mg total) by mouth daily.  Marland Kitchen atorvastatin (LIPITOR) 40 MG tablet Take 1 tablet (40 mg total) by  mouth daily at 6 PM.  . clopidogrel (PLAVIX) 75 MG tablet Take 1 tablet (75 mg total) by mouth daily for 21 days.  . valsartan-hctz  320-25 MG tablet Take 1 tablet by mouth daily. (alleges taking since ER visit)  . amLODipine (NORVASC) 10 MG tablet Defer starting medication until titrated up to valsartan/hctz full tab and tolerating. Then start 1/2 tab daily for 2 weeks, then full tab. (Patient not taking: Reported on 02/25/2019)  . metFORMIN (GLUCOPHAGE-XR) 500 MG 24 hr tablet Start 1 tab daily with largest meal of the day for 2 weeks, then add to second largest meal of the day. (Patient not taking: Reported on 02/17/2019)   Allergies  Allergen Reactions  . Atenolol Other (See Comments)    Made tired   PMHx:   Past Medical History:  Diagnosis Date  . Anxiety   . Hyperlipidemia   . Hypertension   . Migraines   . Prediabetes   . Vitamin D deficiency    No past surgical history on file.  FHx:    Reviewed / unchanged  SHx:    Reviewed / unchanged   Systems Review:  Constitutional: Denies fever, chills, wt changes, headaches, insomnia, fatigue, night sweats, change in appetite. Eyes: Denies redness, blurred vision, diplopia, discharge, itchy, watery eyes.  ENT: Denies discharge, congestion, post nasal drip, epistaxis, sore throat, earache, hearing  loss, dental pain, tinnitus, vertigo, sinus pain, snoring.  CV: Denies chest pain, palpitations, irregular heartbeat, syncope, dyspnea, diaphoresis, orthopnea, PND, claudication or edema. Respiratory: denies cough, dyspnea, DOE, pleurisy, hoarseness, laryngitis, wheezing.  Gastrointestinal: Denies dysphagia, odynophagia, heartburn, reflux, water brash, abdominal pain or cramps, nausea, vomiting, bloating, diarrhea, constipation, hematemesis, melena, hematochezia  or hemorrhoids. Genitourinary: Denies dysuria, frequency, urgency, nocturia, hesitancy, discharge, hematuria or flank pain. Musculoskeletal: Denies arthralgias, myalgias, stiffness,  jt. swelling, pain, limping or strain/sprain.  Skin: Denies pruritus, rash, hives, warts, acne, eczema or change in skin lesion(s). Neuro: No weakness, tremor, incoordination, spasms, paresthesia or pain. Psychiatric: Denies confusion, memory loss or sensory loss. Endo: Denies change in weight, skin or hair change.  Heme/Lymph: No excessive bleeding, bruising or enlarged lymph nodes.  Physical Exam  BP (!) 152/84   Pulse 68   Temp 97.8 F (36.6 C)   Resp 16   Ht 5\' 9"  (1.753 m)   Wt 182 lb 12.8 oz (82.9 kg)   BMI 26.99 kg/m   Repeated BP rechecks with digital wrist & arm cuff & also with wall cuff yielded readings of 228-240/129-130  Appears  well nourished, well groomed  and in no distress.  Eyes: PERRLA, EOMs, conjunctiva no swelling or erythema. Sinuses: No frontal/maxillary tenderness ENT/Mouth: EAC's clear, TM's nl w/o erythema, bulging. Nares clear w/o erythema, swelling, exudates. Oropharynx clear without erythema or exudates. Oral hygiene is good. Tongue normal, non obstructing. Hearing intact.  Neck: Supple. Thyroid not palpable. Car 2+/2+ without bruits, nodes or JVD. Chest: Respirations nl with BS clear & equal w/o rales, rhonchi, wheezing or stridor.  Cor: Heart sounds normal w/ regular rate and rhythm without sig. murmurs, gallops, clicks or rubs. Peripheral pulses normal and equal  without edema.  Abdomen: Soft & bowel sounds normal. Non-tender w/o guarding, rebound, hernias, masses or organomegaly.  Lymphatics: Unremarkable.  Musculoskeletal: Full ROM all peripheral extremities, joint stability, 5/5 strength and normal gait.  Skin: Warm, dry without exposed rashes, lesions or ecchymosis apparent.  Neuro: Cranial nerves intact, reflexes equal bilaterally. Sensory-motor testing grossly intact. Tendon reflexes grossly intact.  Pysch: Alert & oriented x 3.  Insight and judgement nl & appropriate. No ideations.  Assessment and Plan:  1. Severe hypertension  2. Poor  compliance with medication  3. Hyperlipidemia, mixed  4. T2_NIDDM ([place])   5. Vitamin D deficiency  6. Medication management        Patient would not agree to any recommended labs to be drawn. Patient was adamant that he was going to drive home & his wife was contacted to come transport him home. He repeatedly refused recommendations to go to the ER.        Finally as concession , he was given #10 tabs of Minoxidil daily - administering 1st dose in the office. Also, he was given #10 tabs of Doxazosin 8 mg administering 1st dose in the office. Wife was asked to monitor postural supine/ standing BP's at least 2x/day and given strict ER precautions to call 23 , if he developed any neuro sx's as severe HA, weakness, paresthesias, etc. The patient's wife , Clarene Critchley,  agreed with the plan and demonstrated an understanding of the instructions.I provided over 40 minutes of exam, counseling, chart review and  complex critical decision making.  Kirtland Bouchard, MD

## 2019-02-25 ENCOUNTER — Other Ambulatory Visit: Payer: Self-pay

## 2019-02-25 ENCOUNTER — Ambulatory Visit: Payer: 59 | Admitting: Internal Medicine

## 2019-02-25 ENCOUNTER — Ambulatory Visit: Payer: BC Managed Care – PPO | Admitting: Internal Medicine

## 2019-02-25 VITALS — BP 152/84 | HR 68 | Temp 97.8°F | Resp 16 | Ht 69.0 in | Wt 182.8 lb

## 2019-02-25 DIAGNOSIS — E119 Type 2 diabetes mellitus without complications: Secondary | ICD-10-CM

## 2019-02-25 DIAGNOSIS — E782 Mixed hyperlipidemia: Secondary | ICD-10-CM | POA: Diagnosis not present

## 2019-02-25 DIAGNOSIS — Z79899 Other long term (current) drug therapy: Secondary | ICD-10-CM

## 2019-02-25 DIAGNOSIS — Z9114 Patient's other noncompliance with medication regimen: Secondary | ICD-10-CM | POA: Diagnosis not present

## 2019-02-25 DIAGNOSIS — I10 Essential (primary) hypertension: Secondary | ICD-10-CM

## 2019-02-25 DIAGNOSIS — E559 Vitamin D deficiency, unspecified: Secondary | ICD-10-CM

## 2019-02-25 NOTE — Patient Instructions (Signed)

## 2019-02-28 ENCOUNTER — Inpatient Hospital Stay (HOSPITAL_COMMUNITY)
Admission: EM | Admit: 2019-02-28 | Discharge: 2019-03-03 | DRG: 064 | Disposition: A | Discharge status: Home / Self Care | Payer: BC Managed Care – PPO | Attending: Internal Medicine | Admitting: Internal Medicine

## 2019-02-28 ENCOUNTER — Inpatient Hospital Stay (HOSPITAL_COMMUNITY): Payer: BC Managed Care – PPO

## 2019-02-28 ENCOUNTER — Emergency Department (HOSPITAL_COMMUNITY): Payer: BC Managed Care – PPO

## 2019-02-28 ENCOUNTER — Other Ambulatory Visit: Payer: Self-pay

## 2019-02-28 ENCOUNTER — Encounter: Payer: Self-pay | Admitting: Internal Medicine

## 2019-02-28 ENCOUNTER — Encounter (HOSPITAL_COMMUNITY): Payer: Self-pay

## 2019-02-28 ENCOUNTER — Other Ambulatory Visit: Payer: Self-pay | Admitting: Internal Medicine

## 2019-02-28 DIAGNOSIS — I6522 Occlusion and stenosis of left carotid artery: Secondary | ICD-10-CM | POA: Diagnosis present

## 2019-02-28 DIAGNOSIS — E1165 Type 2 diabetes mellitus with hyperglycemia: Secondary | ICD-10-CM | POA: Diagnosis present

## 2019-02-28 DIAGNOSIS — Z20828 Contact with and (suspected) exposure to other viral communicable diseases: Secondary | ICD-10-CM | POA: Diagnosis not present

## 2019-02-28 DIAGNOSIS — I77810 Thoracic aortic ectasia: Secondary | ICD-10-CM | POA: Diagnosis present

## 2019-02-28 DIAGNOSIS — Z9114 Patient's other noncompliance with medication regimen: Secondary | ICD-10-CM

## 2019-02-28 DIAGNOSIS — Z888 Allergy status to other drugs, medicaments and biological substances status: Secondary | ICD-10-CM

## 2019-02-28 DIAGNOSIS — R29700 NIHSS score 0: Secondary | ICD-10-CM | POA: Diagnosis not present

## 2019-02-28 DIAGNOSIS — I10 Essential (primary) hypertension: Secondary | ICD-10-CM | POA: Diagnosis present

## 2019-02-28 DIAGNOSIS — I708 Atherosclerosis of other arteries: Secondary | ICD-10-CM | POA: Diagnosis present

## 2019-02-28 DIAGNOSIS — R42 Dizziness and giddiness: Secondary | ICD-10-CM

## 2019-02-28 DIAGNOSIS — Z8349 Family history of other endocrine, nutritional and metabolic diseases: Secondary | ICD-10-CM | POA: Diagnosis not present

## 2019-02-28 DIAGNOSIS — F408 Other phobic anxiety disorders: Secondary | ICD-10-CM | POA: Diagnosis present

## 2019-02-28 DIAGNOSIS — G936 Cerebral edema: Secondary | ICD-10-CM | POA: Diagnosis not present

## 2019-02-28 DIAGNOSIS — E785 Hyperlipidemia, unspecified: Secondary | ICD-10-CM | POA: Diagnosis not present

## 2019-02-28 DIAGNOSIS — Z7982 Long term (current) use of aspirin: Secondary | ICD-10-CM

## 2019-02-28 DIAGNOSIS — Z9119 Patient's noncompliance with other medical treatment and regimen: Secondary | ICD-10-CM

## 2019-02-28 DIAGNOSIS — Z79899 Other long term (current) drug therapy: Secondary | ICD-10-CM | POA: Diagnosis not present

## 2019-02-28 DIAGNOSIS — Z1159 Encounter for screening for other viral diseases: Secondary | ICD-10-CM

## 2019-02-28 DIAGNOSIS — I651 Occlusion and stenosis of basilar artery: Secondary | ICD-10-CM | POA: Diagnosis not present

## 2019-02-28 DIAGNOSIS — I63442 Cerebral infarction due to embolism of left cerebellar artery: Principal | ICD-10-CM | POA: Diagnosis present

## 2019-02-28 DIAGNOSIS — I6523 Occlusion and stenosis of bilateral carotid arteries: Secondary | ICD-10-CM | POA: Diagnosis not present

## 2019-02-28 DIAGNOSIS — Z7902 Long term (current) use of antithrombotics/antiplatelets: Secondary | ICD-10-CM

## 2019-02-28 DIAGNOSIS — D649 Anemia, unspecified: Secondary | ICD-10-CM | POA: Diagnosis present

## 2019-02-28 DIAGNOSIS — I6350 Cerebral infarction due to unspecified occlusion or stenosis of unspecified cerebral artery: Secondary | ICD-10-CM | POA: Diagnosis not present

## 2019-02-28 DIAGNOSIS — I517 Cardiomegaly: Secondary | ICD-10-CM | POA: Diagnosis not present

## 2019-02-28 DIAGNOSIS — R471 Dysarthria and anarthria: Secondary | ICD-10-CM | POA: Diagnosis not present

## 2019-02-28 DIAGNOSIS — E876 Hypokalemia: Secondary | ICD-10-CM | POA: Diagnosis not present

## 2019-02-28 DIAGNOSIS — I639 Cerebral infarction, unspecified: Secondary | ICD-10-CM

## 2019-02-28 DIAGNOSIS — F419 Anxiety disorder, unspecified: Secondary | ICD-10-CM | POA: Diagnosis present

## 2019-02-28 DIAGNOSIS — E1169 Type 2 diabetes mellitus with other specified complication: Secondary | ICD-10-CM | POA: Diagnosis present

## 2019-02-28 DIAGNOSIS — E119 Type 2 diabetes mellitus without complications: Secondary | ICD-10-CM

## 2019-02-28 DIAGNOSIS — I160 Hypertensive urgency: Secondary | ICD-10-CM | POA: Diagnosis not present

## 2019-02-28 DIAGNOSIS — E559 Vitamin D deficiency, unspecified: Secondary | ICD-10-CM | POA: Diagnosis present

## 2019-02-28 DIAGNOSIS — I6389 Other cerebral infarction: Secondary | ICD-10-CM | POA: Diagnosis not present

## 2019-02-28 DIAGNOSIS — Z8249 Family history of ischemic heart disease and other diseases of the circulatory system: Secondary | ICD-10-CM

## 2019-02-28 LAB — POCT I-STAT EG7
Acid-Base Excess: 4 mmol/L — ABNORMAL HIGH (ref 0.0–2.0)
Bicarbonate: 27.4 mmol/L (ref 20.0–28.0)
Calcium, Ion: 1.12 mmol/L — ABNORMAL LOW (ref 1.15–1.40)
HCT: 38 % — ABNORMAL LOW (ref 39.0–52.0)
Hemoglobin: 12.9 g/dL — ABNORMAL LOW (ref 13.0–17.0)
O2 Saturation: 80 %
Potassium: 3.4 mmol/L — ABNORMAL LOW (ref 3.5–5.1)
Sodium: 137 mmol/L (ref 135–145)
TCO2: 28 mmol/L (ref 22–32)
pCO2, Ven: 35.6 mmHg — ABNORMAL LOW (ref 44.0–60.0)
pH, Ven: 7.494 — ABNORMAL HIGH (ref 7.250–7.430)
pO2, Ven: 40 mmHg (ref 32.0–45.0)

## 2019-02-28 LAB — COMPREHENSIVE METABOLIC PANEL
ALT: 18 U/L (ref 0–44)
AST: 14 U/L — ABNORMAL LOW (ref 15–41)
Albumin: 3.7 g/dL (ref 3.5–5.0)
Alkaline Phosphatase: 64 U/L (ref 38–126)
Anion gap: 13 (ref 5–15)
BUN: 22 mg/dL — ABNORMAL HIGH (ref 6–20)
CO2: 25 mmol/L (ref 22–32)
Calcium: 8.8 mg/dL — ABNORMAL LOW (ref 8.9–10.3)
Chloride: 96 mmol/L — ABNORMAL LOW (ref 98–111)
Creatinine, Ser: 1.24 mg/dL (ref 0.61–1.24)
GFR calc Af Amer: >60 mL/min (ref >60)
GFR calc non Af Amer: >60 mL/min (ref >60)
Glucose, Bld: 249 mg/dL — ABNORMAL HIGH (ref 70–99)
Potassium: 3 mmol/L — ABNORMAL LOW (ref 3.5–5.1)
Sodium: 134 mmol/L — ABNORMAL LOW (ref 135–145)
Total Bilirubin: 1.3 mg/dL — ABNORMAL HIGH (ref 0.3–1.2)
Total Protein: 6.4 g/dL — ABNORMAL LOW (ref 6.5–8.1)

## 2019-02-28 LAB — DIFFERENTIAL
Abs Immature Granulocytes: 0.03 10*3/uL (ref 0.00–0.07)
Basophils Absolute: 0.1 10*3/uL (ref 0.0–0.1)
Basophils Relative: 0 %
Eosinophils Absolute: 0 10*3/uL (ref 0.0–0.5)
Eosinophils Relative: 0 %
Immature Granulocytes: 0 %
Lymphocytes Relative: 12 %
Lymphs Abs: 1.3 10*3/uL (ref 0.7–4.0)
Monocytes Absolute: 1 10*3/uL (ref 0.1–1.0)
Monocytes Relative: 9 %
Neutro Abs: 9 10*3/uL — ABNORMAL HIGH (ref 1.7–7.7)
Neutrophils Relative %: 79 %

## 2019-02-28 LAB — ETHANOL: Alcohol, Ethyl (B): <10 mg/dL (ref <10)

## 2019-02-28 LAB — RAPID URINE DRUG SCREEN, HOSP PERFORMED
Amphetamines: NOT DETECTED
Barbiturates: NOT DETECTED
Benzodiazepines: NOT DETECTED
Cocaine: NOT DETECTED
Opiates: NOT DETECTED
Tetrahydrocannabinol: NOT DETECTED

## 2019-02-28 LAB — URINALYSIS, ROUTINE W REFLEX MICROSCOPIC
Bacteria, UA: NONE SEEN
Bilirubin Urine: NEGATIVE
Glucose, UA: >=500 mg/dL — AB
Hgb urine dipstick: NEGATIVE
Ketones, ur: 80 mg/dL — AB
Leukocytes,Ua: NEGATIVE
Nitrite: NEGATIVE
Protein, ur: 30 mg/dL — AB
Specific Gravity, Urine: 1.025 (ref 1.005–1.030)
pH: 6 (ref 5.0–8.0)

## 2019-02-28 LAB — I-STAT CHEM 8, ED
BUN: 22 mg/dL — ABNORMAL HIGH (ref 6–20)
Calcium, Ion: 1.04 mmol/L — ABNORMAL LOW (ref 1.15–1.40)
Chloride: 97 mmol/L — ABNORMAL LOW (ref 98–111)
Creatinine, Ser: 1.1 mg/dL (ref 0.61–1.24)
Glucose, Bld: 233 mg/dL — ABNORMAL HIGH (ref 70–99)
HCT: 37 % — ABNORMAL LOW (ref 39.0–52.0)
Hemoglobin: 12.6 g/dL — ABNORMAL LOW (ref 13.0–17.0)
Potassium: 3.1 mmol/L — ABNORMAL LOW (ref 3.5–5.1)
Sodium: 136 mmol/L (ref 135–145)
TCO2: 28 mmol/L (ref 22–32)

## 2019-02-28 LAB — CBC
HCT: 37.3 % — ABNORMAL LOW (ref 39.0–52.0)
Hemoglobin: 13.8 g/dL (ref 13.0–17.0)
MCH: 29.8 pg (ref 26.0–34.0)
MCHC: 37 g/dL — ABNORMAL HIGH (ref 30.0–36.0)
MCV: 80.6 fL (ref 80.0–100.0)
Platelets: 244 10*3/uL (ref 150–400)
RBC: 4.63 MIL/uL (ref 4.22–5.81)
RDW: 11.7 % (ref 11.5–15.5)
WBC: 11.4 10*3/uL — ABNORMAL HIGH (ref 4.0–10.5)
nRBC: 0 % (ref 0.0–0.2)

## 2019-02-28 LAB — SARS CORONAVIRUS 2 BY RT PCR (HOSPITAL ORDER, PERFORMED IN ~~LOC~~ HOSPITAL LAB): SARS Coronavirus 2: NEGATIVE

## 2019-02-28 LAB — PROTIME-INR
INR: 1.1 (ref 0.8–1.2)
Prothrombin Time: 14.4 seconds (ref 11.4–15.2)

## 2019-02-28 LAB — APTT: aPTT: 24 seconds (ref 24–36)

## 2019-02-28 MED ORDER — LABETALOL HCL 5 MG/ML IV SOLN
20.0000 mg | INTRAVENOUS | Status: DC | PRN
  Administered 2019-02-28 (×2): 20 mg via INTRAVENOUS
  Filled 2019-02-28 (×2): qty 4

## 2019-02-28 MED ORDER — ONDANSETRON HCL 4 MG/2ML IJ SOLN
4.0000 mg | Freq: Once | INTRAMUSCULAR | Status: AC
  Administered 2019-02-28: 4 mg via INTRAVENOUS
  Filled 2019-02-28: qty 2

## 2019-02-28 MED ORDER — LORAZEPAM 2 MG/ML IJ SOLN
1.0000 mg | INTRAMUSCULAR | Status: DC | PRN
  Administered 2019-02-28 – 2019-03-01 (×2): 1 mg via INTRAVENOUS
  Filled 2019-02-28 (×2): qty 1

## 2019-02-28 MED ORDER — HYDRALAZINE HCL 20 MG/ML IJ SOLN
10.0000 mg | INTRAMUSCULAR | Status: DC | PRN
  Administered 2019-03-01 – 2019-03-03 (×5): 10 mg via INTRAVENOUS
  Filled 2019-02-28 (×6): qty 1

## 2019-02-28 MED ORDER — CLEVIDIPINE BUTYRATE 0.5 MG/ML IV EMUL
0.0000 mg/h | INTRAVENOUS | Status: DC
  Administered 2019-02-28: 2 mg/h via INTRAVENOUS
  Filled 2019-02-28: qty 50

## 2019-02-28 NOTE — ED Notes (Signed)
Report called to rn melvin on 3w

## 2019-02-28 NOTE — ED Provider Notes (Addendum)
[facility] EMERGENCY DEPARTMENT Provider Note   CSN: [id number] Arrival date & time: 02/28/19  1252    History   Chief Complaint Chief Complaint  Patient presents with  . Dizziness    HPI Gabriel Hoover is a 58 y.o. male.     HPI   He is here for evaluation of dizziness.  He states he has been dizzy for several days, and is concerned that his blood pressure is too high.  When he checks it at home it runs around 220/110.  He had a headache during the night but today does not have a headache.  He has ongoing dizziness which makes it difficult to walk.  He was recently hospitalized with hypertensive symptoms, and ruled out for stroke.  He states when he saw his PCP, 4 days ago he was given a new medication, which "made me sleepy so I stopped it."  He has pills with him in a plastic bag, which are doxazosin 8 mg, M and minoxidil 10 mg.  He took only 1 day dosing, of each of the pills and has not repeated them, on 02/25/19.  He denies blurred vision, double vision, focal weakness or paresthesia.  He is ambulatory and came here by private vehicle.  There are no other known modifying factors.  Past Medical History:  Diagnosis Date  . Anxiety   . Hyperlipidemia   . Hypertension   . Migraines   . Prediabetes   . Vitamin D deficiency     Patient Active Problem List   Diagnosis Date Noted  . Amaurosis fugax 02/17/2019  . Overweight (BMI 25.0-29.9) 05/19/2015  . Poor compliance with medication 05/19/2015  . Medication management 05/14/2014  . Severe needle phobia   . Hyperlipidemia   . Hypertension   . T2_NIDDM ([place])   . Vitamin D deficiency     History reviewed. No pertinent surgical history.      Home Medications    Prior to Admission medications   Medication Sig Start Date End Date Taking? Authorizing Provider  aspirin EC 81 MG EC tablet Take 1 tablet (81 mg total) by mouth daily. 02/19/19  Yes Chundi, Verne Spurr, MD  atorvastatin (LIPITOR) 40 MG tablet Take  1 tablet (40 mg total) by mouth daily at 6 PM. 02/18/19 03/20/19 Yes Chundi, Vahini, MD  clopidogrel (PLAVIX) 75 MG tablet Take 1 tablet (75 mg total) by mouth daily for 21 days. 02/19/19 03/12/19 Yes Chundi, Vahini, MD  valsartan-hydrochlorothiazide (DIOVAN-HCT) 320-25 MG tablet Take 1 tablet by mouth daily. 02/18/19 03/20/19 Yes Chundi, Vahini, MD  amLODipine (NORVASC) 10 MG tablet Defer starting medication until titrated up to valsartan/hctz full tab and tolerating. Then start 1/2 tab daily for 2 weeks, then full tab. Patient not taking: Reported on 02/25/2019 02/18/19   Lars Mage, MD    Family History Family History  Problem Relation Age of Onset  . Hyperlipidemia Father   . Heart disease Father     Social History Social History   Tobacco Use  . Smoking status: Never Smoker  . Smokeless tobacco: Never Used  Substance Use Topics  . Alcohol use: No  . Drug use: No     Allergies   Atenolol   Review of Systems Review of Systems  All other systems reviewed and are negative.    Physical Exam Updated Vital Signs BP (!) 205/89 (BP Location: Right Arm)   Pulse 70   Temp 98.4 F (36.9 C) (Oral)   Resp 18   Ht  5\' 9"  (1.753 m)   Wt 83.9 kg   SpO2 95%   BMI 27.32 kg/m   Physical Exam Vitals signs and nursing note reviewed.  Constitutional:      General: He is not in acute distress.    Appearance: He is well-developed. He is not ill-appearing, toxic-appearing or diaphoretic.  HENT:     Head: Normocephalic and atraumatic.     Right Ear: External ear normal.     Left Ear: External ear normal.     Nose: No congestion.     Mouth/Throat:     Pharynx: No oropharyngeal exudate or posterior oropharyngeal erythema.  Eyes:     Conjunctiva/sclera: Conjunctivae normal.     Pupils: Pupils are equal, round, and reactive to light.  Neck:     Musculoskeletal: Normal range of motion and neck supple.     Trachea: Phonation normal.  Cardiovascular:     Rate and Rhythm: Normal rate and  regular rhythm.     Heart sounds: Normal heart sounds.  Pulmonary:     Effort: Pulmonary effort is normal.     Breath sounds: Normal breath sounds.  Abdominal:     Palpations: Abdomen is soft.     Tenderness: There is no abdominal tenderness.  Musculoskeletal: Normal range of motion.     Comments: Normal strength, arms and legs bilaterally.  Skin:    General: Skin is warm and dry.  Neurological:     Mental Status: He is alert and oriented to person, place, and time.     Cranial Nerves: No cranial nerve deficit.     Sensory: No sensory deficit.     Motor: No abnormal muscle tone.     Coordination: Coordination normal.     Comments: No dysarthria, aphasia or nystagmus.  No pronator drift.  Normal finger-to-nose, bilaterally.  Psychiatric:        Mood and Affect: Mood normal.        Behavior: Behavior normal.        Thought Content: Thought content normal.        Judgment: Judgment normal.      ED Treatments / Results  Labs (all labs ordered are listed, but only abnormal results are displayed) Labs Reviewed  URINALYSIS, ROUTINE W REFLEX MICROSCOPIC - Abnormal; Notable for the following components:      Result Value   Glucose, UA >=500 (*)    Ketones, ur 80 (*)    Protein, ur 30 (*)    All other components within normal limits  POCT I-STAT EG7 - Abnormal; Notable for the following components:   pH, Ven 7.494 (*)    pCO2, Ven 35.6 (*)    Acid-Base Excess 4.0 (*)    Potassium 3.4 (*)    Calcium, Ion 1.12 (*)    HCT 38.0 (*)    Hemoglobin 12.9 (*)    All other components within normal limits  SARS CORONAVIRUS 2 (HOSPITAL ORDER, [facility])  ETHANOL  PROTIME-INR  APTT  CBC  DIFFERENTIAL  COMPREHENSIVE METABOLIC PANEL  RAPID URINE DRUG SCREEN, HOSP PERFORMED  I-STAT CHEM 8, ED    EKG EKG Interpretation  Date/Time:  Saturday February 28 2019 18:43:03 EDT Ventricular Rate:  95 PR Interval:    QRS Duration: 76 QT Interval:  390 QTC  Calculation: 491 R Axis:   47 Text Interpretation:  Sinus rhythm Probable left atrial enlargement Probable anteroseptal infarct, old Nonspecific repol abnormality, lateral leads Since last tracing ST elevation now present  in Lateral leads Confirmed by Daleen Bo [phone]) on 02/28/2019 6:50:21 PM   Radiology Ct Head Wo Contrast  Result Date: 02/28/2019 CLINICAL DATA:  One-week history of positional vertigo. EXAM: CT HEAD WITHOUT CONTRAST TECHNIQUE: Contiguous axial images were obtained from the base of the skull through the vertex without intravenous contrast. COMPARISON:  MRI brain 02/17/2019. CTA head and neck including CT brain 02/17/2019. FINDINGS: Brain: Geographic low attenuation involving the LEFT INFERIOR cerebellum, a new finding since the prior examinations 11 days ago. Minimal mass effect. No associated hemorrhage. Ventricular system normal in size and appearance for age. No extra-axial fluid collections. No focal brain parenchymal abnormalities elsewhere. Vascular: No visible atherosclerosis. No hyperdense vessels. Skull: No skull fracture or other focal osseous abnormality involving the skull. Sinuses/Orbits: Visualized paranasal sinuses, bilateral mastoid air cells and bilateral middle ear cavities well-aerated. Benign senile calcification involving the RIGHT eye. Visualized orbits and globes otherwise normal in appearance. Other: None. IMPRESSION: 1. Acute/subacute nonhemorrhagic stroke involving the LEFT INFERIOR cerebellum. 2. Otherwise normal examination. Electronically Signed   By: Evangeline Dakin M.D.   On: 02/28/2019 18:22   Dg Chest Port 1 View  Result Date: 02/28/2019 CLINICAL DATA:  Dizziness for the past week. Hypertension. EXAM: PORTABLE CHEST 1 VIEW COMPARISON:  02/22/2012. FINDINGS: Interval mildly enlarged cardiac silhouette and mildly prominent pulmonary vasculature. Clear lungs. No pleural fluid. Unremarkable bones. IMPRESSION: Interval mild cardiomegaly and mild  pulmonary vascular congestion. Electronically Signed   By: Claudie Revering M.D.   On: 02/28/2019 13:44    Procedures .Critical Care Performed by: Daleen Bo, MD Authorized by: Daleen Bo, MD   Critical care provider statement:    Critical care time (minutes):  75   Critical care start time:  02/28/2019 1:10 PM   Critical care end time:  02/28/2019 7:13 PM   Critical care time was exclusive of:  Separately billable procedures and treating other patients   Critical care was necessary to treat or prevent imminent or life-threatening deterioration of the following conditions:  CNS failure or compromise   Critical care was time spent personally by me on the following activities:  Blood draw for specimens, development of treatment plan with patient or surrogate, discussions with consultants, evaluation of patient's response to treatment, examination of patient, obtaining history from patient or surrogate, ordering and performing treatments and interventions, ordering and review of laboratory studies, pulse oximetry, re-evaluation of patient's condition, review of old charts and ordering and review of radiographic studies   (including critical care time)  Medications Ordered in ED Medications  labetalol (NORMODYNE) injection 20 mg (20 mg Intravenous Given 02/28/19 1905)     Initial Impression / Assessment and Plan / ED Course  I have reviewed the triage vital signs and the nursing notes.  Pertinent labs & imaging results that were available during my care of the patient were reviewed by me and considered in my medical decision making (see chart for details).  Clinical Course as of Feb 27 1913  Sat Feb 28, 2019  1432 Normal except pH increased, PCO2 low, acid-base high, potassium low, calcium low, hematocrit high  POCT I-Stat EG7(!) [EW]  1433 Consistent with mild pulmonary vascular congestion, no infiltrate or cardiomegaly.  DG Chest Port 1 View [EW]    Clinical Course User Index [EW]  Daleen Bo, MD        Patient Vitals for the past 24 hrs:  BP Temp Temp src Pulse Resp SpO2 Height Weight  02/28/19 1837 (!) 205/89 - - 70  18 95 % - -  02/28/19 1727 (!) 201/126 - - 76 20 96 % - -  02/28/19 1643 (!) 181/111 - - 86 20 93 % - -  02/28/19 1630 (!) 181/111 - - 67 18 94 % - -  02/28/19 1600 (!) 171/104 - - 76 (!) 0 94 % - -  02/28/19 1530 (!) 178/105 - - 66 (!) 21 95 % - -  02/28/19 1500 (!) 177/102 - - 65 18 94 % - -  02/28/19 1435 (!) 189/103 - - 62 19 96 % - -  02/28/19 1430 - - - 73 18 94 % - -  02/28/19 1330 - - - 65 16 95 % - -  02/28/19 1315 - - - (!) 57 20 97 % - -  02/28/19 1308 - - - - - - 5\' 9"  (1.753 m) 83.9 kg  02/28/19 1305 (!) 202/114 98.4 F (36.9 C) Oral 64 14 100 % - -    4:02 PM Reevaluation with update and discussion. After initial assessment and treatment, an updated evaluation reveals patient is comfortable, denies headache or dizziness.  Findings discussed with the patient and all questions were answered. Daleen Bo   Medical Decision Making: Patient with nonspecific high blood pressure and dizziness.  Dizziness improved spontaneously.  Blood pressure also improved without specific treatment.  Patient's prior prescribing physician is following him closely, and plans on starting amlodipine, after he has been on increased dose of valsartan/hydrochlorothiazide for 2 weeks.  Doubt CVA, ACS, metabolic instability or impending vascular collapse.  CRITICAL CARE-no Performed by: Daleen Bo  Nursing Notes Reviewed/ Care Coordinated Applicable Imaging Reviewed Interpretation of Laboratory Data incorporated into ED treatment  The patient appears reasonably screened and/or stabilized for discharge and I doubt any other medical condition or other [facility] requiring further screening, evaluation, or treatment in the ED at this time prior to discharge.  Plan: Home Medications-continue usual medications; Home Treatments-low-salt diet; return here if the  recommended treatment, does not improve the symptoms; Recommended follow up-PCP follow-up on Monday, 03/02/2019, as scheduled   Final Clinical Impressions(s) / ED Diagnoses   Final diagnoses:  Hypertension, unspecified type  Dizziness  Cerebrovascular accident (CVA), unspecified mechanism [facility])    ED Discharge Orders    None       Daleen Bo, MD 02/28/19 1609   5:10 PM- I spoke with the emergency technician who was transferring the patient to a wheelchair, for discharge.  He states that he went to the floor, in a semi-controlled fall, and did not appear to have injuries.  He was helped back into a wheelchair, at which point the patient stated that he thought his wife would be concerned about this event.  I asked the technician to check the vital signs and tell me what they are.  Note that the initial discharge was at 16:09.  5:29 PM:  Updated ED vitals>>> Patient Vitals for the past 24 hrs:  BP Temp Temp src Pulse Resp SpO2 Height Weight  02/28/19 1837 (!) 205/89 - - 70 18 95 % - -  02/28/19 1727 (!) 201/126 - - 76 20 96 % - -  02/28/19 1643 (!) 181/111 - - 86 20 93 % - -  02/28/19 1630 (!) 181/111 - - 67 18 94 % - -  02/28/19 1600 (!) 171/104 - - 76 (!) 0 94 % - -  02/28/19 1530 (!) 178/105 - - 66 (!) 21 95 % - -  02/28/19 1500 (!) 177/102 - -  65 18 94 % - -  02/28/19 1435 (!) 189/103 - - 62 19 96 % - -  02/28/19 1430 - - - 73 18 94 % - -  02/28/19 1330 - - - 65 16 95 % - -  02/28/19 1315 - - - (!) 57 20 97 % - -  02/28/19 1308 - - - - - - 5\' 9"  (1.753 m) 83.9 kg  02/28/19 1305 (!) 202/114 98.4 F (36.9 C) Oral 64 14 100 % - -    5:29 PM Reevaluation with update and discussion. After initial assessment and treatment, an updated evaluation reveals since initial discharge, additional vital signs have been done, with blood pressure increasing to 181/111 and then 201/126.  The latter being shortly after the patient was being placed in a wheelchair, and reportedly went to  the floor.  The patient states he went to the floor because he had a feeling that he could not stand up and walk and feels like he will get worse if he leaves.  He does not describe vertigo, headache, neck pain, chest pain or shortness of breath at this time.  The patient states he wants me to contact his wife to discuss the situation, because "she wants me to stay". Daleen Bo    5:45 PM-telephone conversation with wife, Gabriel Hoover.  She states that she checked the patient's blood pressure at home and it was 220/169.  She states that she talked to his PCP, yesterday who stated that the patient "needed to be in the hospital for treatment of his blood pressure."  She states that since he was in the office, on 02/25/2019, he has been "sleepy, lethargic, cannot walk straight and unable to take any of his medicines except Dramamine."  He states that he has chronic "anxiety over needles."  When he is not confronted with needles or physicians he tends to not have anxiety.   Medical Decision Making: Additional comments.  Patient presenting with dizziness characterized primarily by balance trouble, but additionally found to have lethargy, and being noncompliant with his multiple blood pressure medicines.  He was hospitalized, and discharged, on 02/18/2019.  He had been briefly admitted, but had an extensive evaluation for left eye amaurosis fugax, uncontrolled blood pressure, and type 2 diabetes.  He was started on atorvastatin at discharge.  The patient became intolerant of all of his medicines after taking single doses of doxazosin and minoxidil, 4 days ago.  CT scan imaging, indicates left cerebellar stroke, done after the patient was unable ambulate.  This is a subacute stroke.  He will require hospitalization for management at this point.  Delford Wingert was evaluated in Emergency Department on 02/28/2019 for the symptoms described in the history of present illness. He was evaluated in the context of the global  COVID-19 pandemic, which necessitated consideration that the patient might be at risk for infection with the SARS-CoV-2 virus that causes COVID-19. Institutional protocols and algorithms that pertain to the evaluation of patients at risk for COVID-19 are in a state of rapid change based on information released by regulatory bodies including the CDC and federal and state organizations. These policies and algorithms were followed during the patient's care in the ED.   5:55 PM-CT scan brain ordered by me, will arrange for admission following that.  Will not lower blood pressure until CT scan results are back.  6:30 PM-CT scan indicates left cerebellar stroke.  Will recheck blood pressure, and contact neuro hospitalist for assistance with consultation.  Patient  will require hospitalization.  Remainder of stroke panel labs and seizures ordered.  Will screen for Covid-19.   CRITICAL CARE-yes Performed by: Rosana Hoes, MD 02/28/19 [phone]

## 2019-02-28 NOTE — ED Notes (Signed)
Pt c/o some abd pain iv still running very slowly half in

## 2019-02-28 NOTE — H&P (Signed)
History and Physical   Gabriel Hoover HEN:[id number] DOB: April 18, 1961 DOA: 02/28/2019  Referring MD/NP/PA: Dr. Eulis Foster  PCP: Unk Pinto, MD   Outpatient Specialists: None  Patient coming from: Home  Chief Complaint: Dizziness  HPI: Gabriel Hoover is a 58 y.o. male with medical history significant of hypertension, hyperlipidemia, anxiety disorder, migraine headaches, prediabetes who presented to the ER with dizziness for the last 3 days.  His symptoms started back pain.  It started with headache then dizziness.  His blood pressure was notably very high.  Systolic in the 361W he was unable to walk.  Patient has been having problem with his blood pressure lately including recent hospitalization for hypertensive urgency.  He went to see his PCP 4 days ago.  Given doxazosin and minoxidil.  He did not like the medications and have not really taken them well.  No focal weakness.  No visual changes.  Wife is in the room and reported that patient did not want to come to the ER since he started having symptoms 4 days ago.  His CT head showed acute CVA and patient is being admitted to the hospital for treatment...  ED Course: Temperature 98 for blood pressure 205/113 pulse 93 respirate 27 oxygen sat 87% on room air.  Sodium is 134 potassium 3.0 chloride 96 CO2 25 glucose 249 BUN 22 creatinine 1.22 calcium 8.8.  White count is 11.4 hemoglobin 13.8 and platelet 244.  COVID-19 assay is negative.  PT 14.4 INR 1.1.  EKG shows sinus rhythm with a rate of 98 with mild ST depressions in the lateral leads.  Head CT without contrast showed acute to subacute nonhemorrhagic stroke involving the left inferior cerebellum.  MRI of the brain is ordered and patient is being admitted for complete CVA.  Also hypertensive urgency.  Review of Systems: As per HPI otherwise 10 point review of systems negative.    Past Medical History:  Diagnosis Date   Anxiety    Hyperlipidemia    Hypertension    Migraines     Prediabetes    Vitamin D deficiency     History reviewed. No pertinent surgical history.   reports that he has never smoked. He has never used smokeless tobacco. He reports that he does not drink alcohol or use drugs.  Allergies  Allergen Reactions   Atenolol Other (See Comments)    Made tired    Family History  Problem Relation Age of Onset   Hyperlipidemia Father    Heart disease Father      Prior to Admission medications   Medication Sig Start Date End Date Taking? Authorizing Provider  aspirin EC 81 MG EC tablet Take 1 tablet (81 mg total) by mouth daily. 02/19/19  Yes Chundi, Verne Spurr, MD  atorvastatin (LIPITOR) 40 MG tablet Take 1 tablet (40 mg total) by mouth daily at 6 PM. 02/18/19 03/20/19 Yes Chundi, Vahini, MD  clopidogrel (PLAVIX) 75 MG tablet Take 1 tablet (75 mg total) by mouth daily for 21 days. 02/19/19 03/12/19 Yes Chundi, Vahini, MD  valsartan-hydrochlorothiazide (DIOVAN-HCT) 320-25 MG tablet Take 1 tablet by mouth daily. 02/18/19 03/20/19 Yes Chundi, Vahini, MD  amLODipine (NORVASC) 10 MG tablet Defer starting medication until titrated up to valsartan/hctz full tab and tolerating. Then start 1/2 tab daily for 2 weeks, then full tab. Patient not taking: Reported on 02/25/2019 02/18/19   Lars Mage, MD    Physical Exam: Vitals:   02/28/19 1945 02/28/19 2000 02/28/19 2015 02/28/19 2026  BP: (!) 179/104 (!) 172/99 Marland Kitchen)  157/79   Pulse: 69 65 85   Resp: 16 (!) 21 13   Temp:    98.4 F (36.9 C)  TempSrc:      SpO2: 93% 94% 93%   Weight:      Height:          Constitutional: NAD, acutely ill looking Vitals:   02/28/19 1945 02/28/19 2000 02/28/19 2015 02/28/19 2026  BP: (!) 179/104 (!) 172/99 (!) 157/79   Pulse: 69 65 85   Resp: 16 (!) 21 13   Temp:    98.4 F (36.9 C)  TempSrc:      SpO2: 93% 94% 93%   Weight:      Height:       Eyes: PERRL, lids and conjunctivae normal ENMT: Mucous membranes are moist. Posterior pharynx clear of any exudate or  lesions.Normal dentition.  Neck: normal, supple, no masses, no thyromegaly Respiratory: clear to auscultation bilaterally, no wheezing, no crackles. Normal respiratory effort. No accessory muscle use.  Cardiovascular: Sinus tachycardia, no murmurs / rubs / gallops. No extremity edema. 2+ pedal pulses. No carotid bruits.  Abdomen: no tenderness, no masses palpated. No hepatosplenomegaly. Bowel sounds positive.  Musculoskeletal: no clubbing / cyanosis. No joint deformity upper and lower extremities. Good ROM, no contractures. Normal muscle tone.  Skin: no rashes, lesions, ulcers. No induration Neurologic: CN 2-12 grossly intact. Sensation intact, DTR normal. Strength 5/5 in all 4.  Positive Romberg's sign Psychiatric: Normal judgment and insight. Alert and oriented x 3. Normal mood.     Labs on Admission: I have personally reviewed following labs and imaging studies  CBC: Recent Labs  Lab 02/28/19 1355 02/28/19 1930 02/28/19 1944  WBC  --  11.4*  --   NEUTROABS  --  9.0*  --   HGB 12.9* 13.8 12.6*  HCT 38.0* 37.3* 37.0*  MCV  --  80.6  --   PLT  --  244  --    Basic Metabolic Panel: Recent Labs  Lab 02/28/19 1355 02/28/19 1930 02/28/19 1944  NA 137 134* 136  K 3.4* 3.0* 3.1*  CL  --  96* 97*  CO2  --  25  --   GLUCOSE  --  249* 233*  BUN  --  22* 22*  CREATININE  --  1.24 1.10  CALCIUM  --  8.8*  --    GFR: Estimated Creatinine Clearance: 74.1 mL/min (by C-G formula based on SCr of 1.1 mg/dL). Liver Function Tests: Recent Labs  Lab 02/28/19 1930  AST 14*  ALT 18  ALKPHOS 64  BILITOT 1.3*  PROT 6.4*  ALBUMIN 3.7   No results for input(s): LIPASE, AMYLASE in the last 168 hours. No results for input(s): AMMONIA in the last 168 hours. Coagulation Profile: Recent Labs  Lab 02/28/19 1930  INR 1.1   Cardiac Enzymes: No results for input(s): CKTOTAL, CKMB, CKMBINDEX, TROPONINI in the last 168 hours. BNP (last 3 results) No results for input(s): PROBNP in the  last 8760 hours. HbA1C: No results for input(s): HGBA1C in the last 72 hours. CBG: No results for input(s): GLUCAP in the last 168 hours. Lipid Profile: No results for input(s): CHOL, HDL, LDLCALC, TRIG, CHOLHDL, LDLDIRECT in the last 72 hours. Thyroid Function Tests: No results for input(s): TSH, T4TOTAL, FREET4, T3FREE, THYROIDAB in the last 72 hours. Anemia Panel: No results for input(s): VITAMINB12, FOLATE, FERRITIN, TIBC, IRON, RETICCTPCT in the last 72 hours. Urine analysis:    Component Value Date/Time   COLORURINE YELLOW  02/28/2019 [place] 02/28/2019 1831   LABSPEC 1.025 02/28/2019 1831   PHURINE 6.0 02/28/2019 1831   GLUCOSEU >=500 (A) 02/28/2019 1831   HGBUR NEGATIVE 02/28/2019 1831   BILIRUBINUR NEGATIVE 02/28/2019 1831   KETONESUR 80 (A) 02/28/2019 1831   PROTEINUR 30 (A) 02/28/2019 1831   NITRITE NEGATIVE 02/28/2019 1831   LEUKOCYTESUR NEGATIVE 02/28/2019 1831   Sepsis Labs: @LABRCNTIP (procalcitonin:4,lacticidven:4) )No results found for this or any previous visit (from the past 240 hour(s)).   Radiological Exams on Admission: Ct Head Wo Contrast  Result Date: 02/28/2019 CLINICAL DATA:  One-week history of positional vertigo. EXAM: CT HEAD WITHOUT CONTRAST TECHNIQUE: Contiguous axial images were obtained from the base of the skull through the vertex without intravenous contrast. COMPARISON:  MRI brain 02/17/2019. CTA head and neck including CT brain 02/17/2019. FINDINGS: Brain: Geographic low attenuation involving the LEFT INFERIOR cerebellum, a new finding since the prior examinations 11 days ago. Minimal mass effect. No associated hemorrhage. Ventricular system normal in size and appearance for age. No extra-axial fluid collections. No focal brain parenchymal abnormalities elsewhere. Vascular: No visible atherosclerosis. No hyperdense vessels. Skull: No skull fracture or other focal osseous abnormality involving the skull. Sinuses/Orbits: Visualized  paranasal sinuses, bilateral mastoid air cells and bilateral middle ear cavities well-aerated. Benign senile calcification involving the RIGHT eye. Visualized orbits and globes otherwise normal in appearance. Other: None. IMPRESSION: 1. Acute/subacute nonhemorrhagic stroke involving the LEFT INFERIOR cerebellum. 2. Otherwise normal examination. Electronically Signed   By: Evangeline Dakin M.D.   On: 02/28/2019 18:22   Dg Chest Port 1 View  Result Date: 02/28/2019 CLINICAL DATA:  Dizziness for the past week. Hypertension. EXAM: PORTABLE CHEST 1 VIEW COMPARISON:  02/22/2012. FINDINGS: Interval mildly enlarged cardiac silhouette and mildly prominent pulmonary vasculature. Clear lungs. No pleural fluid. Unremarkable bones. IMPRESSION: Interval mild cardiomegaly and mild pulmonary vascular congestion. Electronically Signed   By: Claudie Revering M.D.   On: 02/28/2019 13:44    EKG: Independently reviewed.  It shows sinus rhythm with a rate of 98, evidence of LVH by voltage criteria, no specific ST changes  Assessment/Plan Principal Problem:   Acute cerebrovascular accident (CVA) ([place]) Active Problems:   Hyperlipidemia   Hypertension   T2_NIDDM ([place])   Poor compliance with medication     #1 acute cerebellar infarct: Appears to be probably subacute.  The stroke is completed.  Outside the window for TPA administration.  Patient has markedly elevated blood pressure.  No permissive hypertension therefore.  Goal is to control his blood pressure tightly to avoid bleed.  Aspirin statin as well as PT OT and speech therapy.  Neurology following.  Get echocardiogram, hemoglobin A1c.  #2 hypertensive urgency: Patient has malignant hypertension.  He is getting IV medications to control blood pressure.  We will admit to progressive care.  IV drips if needed.  Will need restructure oral blood pressure medications prior to discharge.  #3 hyperglycemia: Patient reported being prediabetic.  He has been told before that  he could be a diabetic but he refused to accept it.  We will check hemoglobin A1c.  If elevated start sliding scale insulin and place patient on diabetic medications.  #4 hyperlipidemia: Continue with statin.  #5 hypokalemia: Replete potassium   DVT prophylaxis: Lovenox Code Status: Full code Family Communication: Wife at bedside Disposition Plan: To be determined Consults called: Dr. Cheral Marker of neurology Admission status: Inpatient  Severity of Illness: The appropriate patient status for this patient is INPATIENT. Inpatient status is judged  to be reasonable and necessary in order to provide the required intensity of service to ensure the patient's safety. The patient's presenting symptoms, physical exam findings, and initial radiographic and laboratory data in the context of their chronic comorbidities is felt to place them at high risk for further clinical deterioration. Furthermore, it is not anticipated that the patient will be medically stable for discharge from the hospital within 2 midnights of admission. The following factors support the patient status of inpatient.   " The patient's presenting symptoms include dizziness. " The worrisome physical exam findings include gait abnormalities with positive Romberg sign. " The initial radiographic and laboratory data are worrisome because of head CT without contrast showing acute CVA. " The chronic co-morbidities include hypertension hyperlipidemia and medication noncompliance.   * I certify that at the point of admission it is my clinical judgment that the patient will require inpatient hospital care spanning beyond 2 midnights from the point of admission due to high intensity of service, high risk for further deterioration and high frequency of surveillance required.Barbette Merino MD [facility] Pager [phone]  If 7PM-7AM, please contact night-coverage [email or web address] Password [email or web address]  02/28/2019, 8:30 PM

## 2019-02-28 NOTE — ED Notes (Signed)
The pt is c/o some nausea then reports that he does not like needles

## 2019-02-28 NOTE — ED Notes (Signed)
To x-ray

## 2019-02-28 NOTE — ED Triage Notes (Signed)
Pt endorses dizziness ongoing x 1 week and hypertension. Pt admitted here 1 week ago for same. Dizziness worse with position change. No neuro deficits. Pt took 2 xanax's on the way here "because I don't like needles" Axox4.

## 2019-02-28 NOTE — ED Notes (Signed)
Called  From doctor who told me to discontinue the cleviprex

## 2019-02-28 NOTE — ED Notes (Addendum)
When transferring pt from bed to wheelchair for discharge pt became dizzy and began lowering himself to the ground. Pt was lifted back into wheelchair with assistance. Pt stated that he is extremely dizzy and his wife will not be happy with him being discharged. RN and MD informed. VITALS: blood pressure 201/126, Pulse 76, SpO2 96%.

## 2019-02-28 NOTE — ED Provider Notes (Signed)
1855: Pt under initial care of EDP Wentz. See note for full details. Pt here with dizziness. Found to have acute stroke L cerebellum. At hand off patient is awaiting labs. Hypertensive in ER, labetalol for hypertension ordered per Dr Eulis Foster.   Plan for f/u on labs, pending call back from neuro-hospitalist for stroke management. Likely medicine admission for hypertension, new stroke.  Physical Exam  BP (!) 172/99   Pulse 65   Temp 98.4 F (36.9 C) (Oral)   Resp (!) 21   Ht 5\' 9"  (1.753 m)   Wt 83.9 kg   SpO2 94%   BMI 27.32 kg/m   Physical Exam Constitutional:      Appearance: He is well-developed.  HENT:     Head: Normocephalic.     Nose: Nose normal.  Eyes:     General: Lids are normal.  Neck:     Musculoskeletal: Normal range of motion.  Cardiovascular:     Rate and Rhythm: Normal rate.  Pulmonary:     Effort: Pulmonary effort is normal. No respiratory distress.  Musculoskeletal: Normal range of motion.  Neurological:     Mental Status: He is alert.  Psychiatric:        Behavior: Behavior normal.     ED Course/Procedures   Clinical Course as of Feb 28 2003  Sat Feb 28, 2019  1432 Normal except pH increased, PCO2 low, acid-base high, potassium low, calcium low, hematocrit high  POCT I-Stat EG7(!) [EW]  1433 Consistent with mild pulmonary vascular congestion, no infiltrate or cardiomegaly.  DG Chest Port 1 View [EW]    Clinical Course User Index [EW] Daleen Bo, MD    .Critical Care Performed by: Kinnie Feil, PA-C Authorized by: Kinnie Feil, PA-C   Critical care provider statement:    Critical care time (minutes):  45   Critical care was necessary to treat or prevent imminent or life-threatening deterioration of the following conditions:  CNS failure or compromise (acute stroke)   Critical care was time spent personally by me on the following activities:  Discussions with consultants, evaluation of patient's response to treatment, examination  of patient, ordering and performing treatments and interventions, ordering and review of laboratory studies, ordering and review of radiographic studies, pulse oximetry, re-evaluation of patient's condition, obtaining history from patient or surrogate, review of old charts and development of treatment plan with patient or surrogate   I assumed direction of critical care for this patient from another provider in my specialty: no      MDM    1946: Discussed with Dr. Cheral Marker recommends MRI brain WO and MRA head WO, labetalol as needed for now.  Recommends against permissive hypertension and SBP goal 160.  He will order clevidipine. Pending medicine consult for admission.   2004: Accepted by Dr Jonelle Sidle. CBC and chem 8 unremarkable. BP improving.     Kinnie Feil, PA-C 02/28/19 2004    Daleen Bo, MD 03/02/19 1044

## 2019-02-28 NOTE — ED Notes (Signed)
To mri 

## 2019-03-01 ENCOUNTER — Inpatient Hospital Stay (HOSPITAL_COMMUNITY): Payer: BC Managed Care – PPO

## 2019-03-01 ENCOUNTER — Encounter (HOSPITAL_COMMUNITY): Payer: Self-pay | Admitting: *Deleted

## 2019-03-01 DIAGNOSIS — I639 Cerebral infarction, unspecified: Secondary | ICD-10-CM

## 2019-03-01 LAB — CBC
HCT: 35.4 % — ABNORMAL LOW (ref 39.0–52.0)
Hemoglobin: 12.9 g/dL — ABNORMAL LOW (ref 13.0–17.0)
MCH: 29.3 pg (ref 26.0–34.0)
MCHC: 36.4 g/dL — ABNORMAL HIGH (ref 30.0–36.0)
MCV: 80.5 fL (ref 80.0–100.0)
Platelets: 244 10*3/uL (ref 150–400)
RBC: 4.4 MIL/uL (ref 4.22–5.81)
RDW: 11.9 % (ref 11.5–15.5)
WBC: 11.1 10*3/uL — ABNORMAL HIGH (ref 4.0–10.5)
nRBC: 0 % (ref 0.0–0.2)

## 2019-03-01 LAB — HEMOGLOBIN A1C
Hgb A1c MFr Bld: 10.1 % — ABNORMAL HIGH (ref 4.8–5.6)
Mean Plasma Glucose: 243.17 mg/dL

## 2019-03-01 LAB — GLUCOSE, CAPILLARY
Glucose-Capillary: 176 mg/dL — ABNORMAL HIGH (ref 70–99)
Glucose-Capillary: 215 mg/dL — ABNORMAL HIGH (ref 70–99)
Glucose-Capillary: 210 mg/dL — ABNORMAL HIGH (ref 70–99)

## 2019-03-01 LAB — LIPID PANEL
Cholesterol: 114 mg/dL (ref 0–200)
HDL: 39 mg/dL — ABNORMAL LOW (ref >40)
LDL Cholesterol: 59 mg/dL (ref 0–99)
Total CHOL/HDL Ratio: 2.9 RATIO
Triglycerides: 80 mg/dL (ref <150)
VLDL: 16 mg/dL (ref 0–40)

## 2019-03-01 LAB — COMPREHENSIVE METABOLIC PANEL
ALT: 17 U/L (ref 0–44)
AST: 15 U/L (ref 15–41)
Albumin: 3.3 g/dL — ABNORMAL LOW (ref 3.5–5.0)
Alkaline Phosphatase: 64 U/L (ref 38–126)
Anion gap: 10 (ref 5–15)
BUN: 19 mg/dL (ref 6–20)
CO2: 25 mmol/L (ref 22–32)
Calcium: 8.4 mg/dL — ABNORMAL LOW (ref 8.9–10.3)
Chloride: 100 mmol/L (ref 98–111)
Creatinine, Ser: 1.11 mg/dL (ref 0.61–1.24)
GFR calc Af Amer: >60 mL/min (ref >60)
GFR calc non Af Amer: >60 mL/min (ref >60)
Glucose, Bld: 236 mg/dL — ABNORMAL HIGH (ref 70–99)
Potassium: 3 mmol/L — ABNORMAL LOW (ref 3.5–5.1)
Sodium: 135 mmol/L (ref 135–145)
Total Bilirubin: 1.1 mg/dL (ref 0.3–1.2)
Total Protein: 5.8 g/dL — ABNORMAL LOW (ref 6.5–8.1)

## 2019-03-01 LAB — ECHOCARDIOGRAM COMPLETE
Height: 69 in
Weight: 2881.85 oz

## 2019-03-01 LAB — HIV ANTIBODY (ROUTINE TESTING W REFLEX): HIV Screen 4th Generation wRfx: NONREACTIVE

## 2019-03-01 MED ORDER — ATORVASTATIN CALCIUM 80 MG PO TABS
80.0000 mg | ORAL_TABLET | Freq: Every day | ORAL | Status: DC
  Administered 2019-03-01 – 2019-03-02 (×2): 80 mg via ORAL
  Filled 2019-03-01 (×2): qty 1

## 2019-03-01 MED ORDER — ASPIRIN EC 81 MG PO TBEC
81.0000 mg | DELAYED_RELEASE_TABLET | Freq: Every day | ORAL | Status: DC
  Administered 2019-03-01 – 2019-03-03 (×2): 81 mg via ORAL
  Filled 2019-03-01 (×4): qty 1

## 2019-03-01 MED ORDER — CLOPIDOGREL BISULFATE 75 MG PO TABS
75.0000 mg | ORAL_TABLET | Freq: Every day | ORAL | Status: DC
  Administered 2019-03-01 – 2019-03-03 (×2): 75 mg via ORAL
  Filled 2019-03-01 (×3): qty 1

## 2019-03-01 MED ORDER — SODIUM CHLORIDE 0.9 % IV SOLN
INTRAVENOUS | Status: DC
  Administered 2019-03-01: 01:00:00 via INTRAVENOUS

## 2019-03-01 MED ORDER — ENOXAPARIN SODIUM 40 MG/0.4ML ~~LOC~~ SOLN
40.0000 mg | SUBCUTANEOUS | Status: DC
  Administered 2019-03-01 – 2019-03-03 (×2): 40 mg via SUBCUTANEOUS
  Filled 2019-03-01 (×2): qty 0.4

## 2019-03-01 MED ORDER — INSULIN ASPART 100 UNIT/ML ~~LOC~~ SOLN
0.0000 [IU] | Freq: Three times a day (TID) | SUBCUTANEOUS | Status: DC
  Administered 2019-03-02: 8 [IU] via SUBCUTANEOUS
  Administered 2019-03-03: 5 [IU] via SUBCUTANEOUS
  Administered 2019-03-03: 3 [IU] via SUBCUTANEOUS

## 2019-03-01 MED ORDER — STROKE: EARLY STAGES OF RECOVERY BOOK
Freq: Once | Status: AC
  Administered 2019-03-01
  Filled 2019-03-01: qty 1

## 2019-03-01 MED ORDER — ACETAMINOPHEN 650 MG RE SUPP: 650.0000 mg | RECTAL | Status: DC | PRN

## 2019-03-01 MED ORDER — SENNOSIDES-DOCUSATE SODIUM 8.6-50 MG PO TABS: 1.0000 | ORAL_TABLET | Freq: Every evening | ORAL | Status: DC | PRN

## 2019-03-01 MED ORDER — ATORVASTATIN CALCIUM 40 MG PO TABS: 40.0000 mg | ORAL_TABLET | Freq: Every day | ORAL | Status: DC

## 2019-03-01 MED ORDER — ACETAMINOPHEN 160 MG/5ML PO SOLN: 650.0000 mg | ORAL | Status: DC | PRN

## 2019-03-01 MED ORDER — INSULIN ASPART 100 UNIT/ML ~~LOC~~ SOLN: 0.0000 [IU] | Freq: Every day | SUBCUTANEOUS | Status: DC

## 2019-03-01 MED ORDER — IRBESARTAN 300 MG PO TABS
300.0000 mg | ORAL_TABLET | Freq: Every day | ORAL | Status: DC
  Administered 2019-03-01 – 2019-03-03 (×2): 300 mg via ORAL
  Filled 2019-03-01 (×3): qty 1

## 2019-03-01 MED ORDER — ACETAMINOPHEN 325 MG PO TABS
650.0000 mg | ORAL_TABLET | ORAL | Status: DC | PRN
  Administered 2019-03-01 (×2): 650 mg via ORAL
  Filled 2019-03-01 (×2): qty 2

## 2019-03-01 MED ORDER — VALSARTAN-HYDROCHLOROTHIAZIDE 320-25 MG PO TABS: 1.0000 | ORAL_TABLET | Freq: Every day | ORAL | Status: DC

## 2019-03-01 MED ORDER — POTASSIUM CHLORIDE CRYS ER 20 MEQ PO TBCR
40.0000 meq | EXTENDED_RELEASE_TABLET | Freq: Two times a day (BID) | ORAL | Status: AC
  Administered 2019-03-01 (×2): 40 meq via ORAL
  Filled 2019-03-01 (×2): qty 2

## 2019-03-01 MED ORDER — HYDROCHLOROTHIAZIDE 25 MG PO TABS
25.0000 mg | ORAL_TABLET | Freq: Every day | ORAL | Status: DC
  Administered 2019-03-01 – 2019-03-03 (×2): 25 mg via ORAL
  Filled 2019-03-01 (×3): qty 1

## 2019-03-01 NOTE — Progress Notes (Signed)
  Echocardiogram 2D Echocardiogram has been performed.  Gabriel Hoover 03/01/2019, 9:38 AM

## 2019-03-01 NOTE — Consult Note (Signed)
Referring Physician: Dr. Jonelle Sidle    Chief Complaint: Left cerebellar stroke.   HPI: Gabriel Hoover is an 58 y.o. male with HTN and DM2 who presented to the [facility] ED on Saturday afternoon with ongoing dizziness x 1 week in conjunction with HTN. He had been admitted here 1 week ago on 7/7 for acute monocular vision loss lasting for 10 minutes, then returning over about one hour. He was diagnosed with amaurosis fugax. Severe HTN with SBP in the 220s on initial presentation was noted. CTA of head and neck, MRI brain and echocardiogram were unremarkable. His discharge medications included atorvastatin, ASA and Plavix.    Of note, the patient has a severe needle phobia limiting our ability to draw blood. He required sedation for a blood draw early this AM, which somewhat compromised his mentation on subsequent exam.    Past Medical History:  Diagnosis Date  . Anxiety   . Hyperlipidemia   . Hypertension   . Migraines   . Prediabetes   . Vitamin D deficiency     History reviewed. No pertinent surgical history.  Family History  Problem Relation Age of Onset  . Hyperlipidemia Father   . Heart disease Father    Social History:  reports that he has never smoked. He has never used smokeless tobacco. He reports that he does not drink alcohol or use drugs.  Allergies:  Allergies  Allergen Reactions  . Atenolol Other (See Comments)    Made tired    Medications:  Prior to Admission:  Medications Prior to Admission  Medication Sig Dispense Refill Last Dose  . aspirin EC 81 MG EC tablet Take 1 tablet (81 mg total) by mouth daily. 90 tablet 0 02/26/2019 at Unknown time  . atorvastatin (LIPITOR) 40 MG tablet Take 1 tablet (40 mg total) by mouth daily at 6 PM. 30 tablet 0 02/26/2019  . clopidogrel (PLAVIX) 75 MG tablet Take 1 tablet (75 mg total) by mouth daily for 21 days. 21 tablet 0 02/26/2019  . valsartan-hydrochlorothiazide (DIOVAN-HCT) 320-25 MG tablet Take 1 tablet by mouth daily. 30 tablet 0  02/26/2019  . amLODipine (NORVASC) 10 MG tablet Defer starting medication until titrated up to valsartan/hctz full tab and tolerating. Then start 1/2 tab daily for 2 weeks, then full tab. (Patient not taking: Reported on 02/25/2019) 30 tablet 0 Not Taking at Unknown time   Scheduled: . aspirin EC  81 mg Oral Daily  . atorvastatin  40 mg Oral q1800  . clopidogrel  75 mg Oral Daily  . enoxaparin (LOVENOX) injection  40 mg Subcutaneous Q24H  . hydrochlorothiazide  25 mg Oral Daily  . irbesartan  300 mg Oral Daily  . potassium chloride  40 mEq Oral BID   Continuous: . sodium chloride 100 mL/hr at 03/01/19 0035    ROS: As per HPI. Unable to obtain a comprehensive ROS due to sedation.   Physical Examination: Blood pressure (!) 155/81, pulse 75, temperature 99 F (37.2 C), temperature source Oral, resp. rate 18, height 5\' 9"  (1.753 m), weight 83.9 kg, SpO2 92 %.  HEENT: [place]/AT Lungs: Respirations unlabored Ext: No edema  Neurologic Examination: Mental Status: Awakens with some difficulty to a sedated, drowsy state (received Ativan for needle stick prior to exam). Mildly confused in this context with some disorientation and increased latency of verbal and motor responses. Speech with sedated slightly dysarthric quality but fluent. Able to follow all simple commands. Naming intact for thumb and pinky. States ID card is a "get out  of jail free card".  Cranial Nerves: II:  Visual fields intact with no extinction to DSS. PERRL.  III,IV, VI: EOMI with saccadic quality of visual pursuits noted. No nystagmus. Right sided ptosis is noted.  V,VII: No facial droop. Facial temp sensation equal bilaterally VIII: hearing intact to voice IX,X: no hypophonia XI: Symmetric XII: midline tongue extension without atrophy or fasciculations Motor: Right : Upper extremity 5/5 except 4/5 grip   Left:     Upper extremity   5/5  Lower extremity   5/5     Lower extremity   5/5 Tone and bulk:normal tone throughout;  no atrophy noted Sensory: Temp and light touch intact throughout, bilaterally. No extinction.  Deep Tendon Reflexes:  2+ bilateral upper and lower extremities.  Plantars: Right: downgoing   Left: downgoing Cerebellar: Normal FNF on the right. Mild ataxia with FNF on left. H-S grossly normal bilaterally.  Gait: Deferred  Results for orders placed or performed during the hospital encounter of 02/28/19 (from the past 48 hour(s))  POCT I-Stat EG7     Status: Abnormal   Collection Time: 02/28/19  1:55 PM  Result Value Ref Range   pH, Ven 7.494 (H) 7.250 - 7.430   pCO2, Ven 35.6 (L) 44.0 - 60.0 mmHg   pO2, Ven 40.0 32.0 - 45.0 mmHg   Bicarbonate 27.4 20.0 - 28.0 mmol/L   TCO2 28 22 - 32 mmol/L   O2 Saturation 80.0 %   Acid-Base Excess 4.0 (H) 0.0 - 2.0 mmol/L   Sodium 137 135 - 145 mmol/L   Potassium 3.4 (L) 3.5 - 5.1 mmol/L   Calcium, Ion 1.12 (L) 1.15 - 1.40 mmol/L   HCT 38.0 (L) 39.0 - 52.0 %   Hemoglobin 12.9 (L) 13.0 - 17.0 g/dL   Patient temperature HIDE    Sample type VENOUS   Urine rapid drug screen (hosp performed)     Status: None   Collection Time: 02/28/19  6:31 PM  Result Value Ref Range   Opiates NONE DETECTED NONE DETECTED   Cocaine NONE DETECTED NONE DETECTED   Benzodiazepines NONE DETECTED NONE DETECTED   Amphetamines NONE DETECTED NONE DETECTED   Tetrahydrocannabinol NONE DETECTED NONE DETECTED   Barbiturates NONE DETECTED NONE DETECTED    Comment: (NOTE) DRUG SCREEN FOR MEDICAL PURPOSES ONLY.  IF CONFIRMATION IS NEEDED FOR ANY PURPOSE, NOTIFY LAB WITHIN 5 DAYS. LOWEST DETECTABLE LIMITS FOR URINE DRUG SCREEN Drug Class                     Cutoff (ng/mL) Amphetamine and metabolites    1000 Barbiturate and metabolites    200 Benzodiazepine                 481 Tricyclics and metabolites     300 Opiates and metabolites        300 Cocaine and metabolites        300 THC                            50 Performed at [facility], [place] [address],  [place], [place] [address]   Urinalysis, Routine w reflex microscopic     Status: Abnormal   Collection Time: 02/28/19  6:31 PM  Result Value Ref Range   Color, Urine YELLOW YELLOW   APPearance CLEAR CLEAR   Specific Gravity, Urine 1.025 1.005 - 1.030   pH 6.0 5.0 - 8.0   Glucose, UA >=  500 (A) NEGATIVE mg/dL   Hgb urine dipstick NEGATIVE NEGATIVE   Bilirubin Urine NEGATIVE NEGATIVE   Ketones, ur 80 (A) NEGATIVE mg/dL   Protein, ur 30 (A) NEGATIVE mg/dL   Nitrite NEGATIVE NEGATIVE   Leukocytes,Ua NEGATIVE NEGATIVE   RBC / HPF 0-5 0 - 5 RBC/hpf   WBC, UA 0-5 0 - 5 WBC/hpf   Bacteria, UA NONE SEEN NONE SEEN   Squamous Epithelial / LPF 0-5 0 - 5   Mucus PRESENT     Comment: Performed at [facility], [place] [address], [place], [place] [address]  SARS Coronavirus 2 (CEPHEID - Performed in [facility] lab), Hosp Order     Status: None   Collection Time: 02/28/19  7:25 PM   Specimen: Nasopharyngeal Swab  Result Value Ref Range   SARS Coronavirus 2 NEGATIVE NEGATIVE    Comment: (NOTE) If result is NEGATIVE SARS-CoV-2 target nucleic acids are NOT DETECTED. The SARS-CoV-2 RNA is generally detectable in upper and lower  respiratory specimens during the acute phase of infection. The lowest  concentration of SARS-CoV-2 viral copies this assay can detect is 250  copies / mL. A negative result does not preclude SARS-CoV-2 infection  and should not be used as the sole basis for treatment or other  patient management decisions.  A negative result may occur with  improper specimen collection / handling, submission of specimen other  than nasopharyngeal swab, presence of viral mutation(s) within the  areas targeted by this assay, and inadequate number of viral copies  (<250 copies / mL). A negative result must be combined with clinical  observations, patient history, and epidemiological information. If result is POSITIVE SARS-CoV-2 target nucleic acids are DETECTED. The SARS-CoV-2 RNA  is generally detectable in upper and lower  respiratory specimens dur ing the acute phase of infection.  Positive  results are indicative of active infection with SARS-CoV-2.  Clinical  correlation with patient history and other diagnostic information is  necessary to determine patient infection status.  Positive results do  not rule out bacterial infection or co-infection with other viruses. If result is PRESUMPTIVE POSTIVE SARS-CoV-2 nucleic acids MAY BE PRESENT.   A presumptive positive result was obtained on the submitted specimen  and confirmed on repeat testing.  While 2019 novel coronavirus  (SARS-CoV-2) nucleic acids may be present in the submitted sample  additional confirmatory testing may be necessary for epidemiological  and / or clinical management purposes  to differentiate between  SARS-CoV-2 and other Sarbecovirus currently known to infect humans.  If clinically indicated additional testing with an alternate test  methodology [phone]) is advised. The SARS-CoV-2 RNA is generally  detectable in upper and lower respiratory sp ecimens during the acute  phase of infection. The expected result is Negative. Fact Sheet for Patients:  StrictlyIdeas.no Fact Sheet for Healthcare Providers: BankingDealers.co.za This test is not yet approved or cleared by the [place] FDA and has been authorized for detection and/or diagnosis of SARS-CoV-2 by FDA under an Emergency Use Authorization (EUA).  This EUA will remain in effect (meaning this test can be used) for the duration of the COVID-19 declaration under Section 564(b)(1) of the Act, 21 U.S.C. section 360bbb-3(b)(1), unless the authorization is terminated or revoked sooner. Performed at [facility], [place] [address], [place], [place] [address]   Ethanol     Status: None   Collection Time: 02/28/19  7:30 PM  Result Value Ref Range   Alcohol, Ethyl (B) <10 <10  mg/dL     Comment: (NOTE) Lowest detectable limit for serum alcohol is 10 mg/dL. For medical purposes only. Performed at [facility], [place] [address] [place], [place] [address]   Protime-INR     Status: None   Collection Time: 02/28/19  7:30 PM  Result Value Ref Range   Prothrombin Time 14.4 11.4 - 15.2 seconds   INR 1.1 0.8 - 1.2    Comment: (NOTE) INR goal varies based on device and disease states. Performed at [facility], [place] [address], [place], [place] [address]   APTT     Status: None   Collection Time: 02/28/19  7:30 PM  Result Value Ref Range   aPTT 24 24 - 36 seconds    Comment: Performed at [facility] [address] [place], [place] [address]  CBC     Status: Abnormal   Collection Time: 02/28/19  7:30 PM  Result Value Ref Range   WBC 11.4 (H) 4.0 - 10.5 K/uL   RBC 4.63 4.22 - 5.81 MIL/uL   Hemoglobin 13.8 13.0 - 17.0 g/dL   HCT 37.3 (L) 39.0 - 52.0 %   MCV 80.6 80.0 - 100.0 fL   MCH 29.8 26.0 - 34.0 pg   MCHC 37.0 (H) 30.0 - 36.0 g/dL   RDW 11.7 11.5 - 15.5 %   Platelets 244 150 - 400 K/uL   nRBC 0.0 0.0 - 0.2 %    Comment: Performed at [facility], [place] [address], [place], [place] [address]  Differential     Status: Abnormal   Collection Time: 02/28/19  7:30 PM  Result Value Ref Range   Neutrophils Relative % 79 %   Neutro Abs 9.0 (H) 1.7 - 7.7 K/uL   Lymphocytes Relative 12 %   Lymphs Abs 1.3 0.7 - 4.0 K/uL   Monocytes Relative 9 %   Monocytes Absolute 1.0 0.1 - 1.0 K/uL   Eosinophils Relative 0 %   Eosinophils Absolute 0.0 0.0 - 0.5 K/uL   Basophils Relative 0 %   Basophils Absolute 0.1 0.0 - 0.1 K/uL   Immature Granulocytes 0 %   Abs Immature Granulocytes 0.03 0.00 - 0.07 K/uL    Comment: Performed at [facility] [address] [place], [place] [address]  Comprehensive metabolic panel     Status: Abnormal   Collection Time: 02/28/19  7:30 PM  Result Value Ref Range   Sodium 134 (L) 135 - 145 mmol/L   Potassium 3.0 (L)  3.5 - 5.1 mmol/L   Chloride 96 (L) 98 - 111 mmol/L   CO2 25 22 - 32 mmol/L   Glucose, Bld 249 (H) 70 - 99 mg/dL   BUN 22 (H) 6 - 20 mg/dL   Creatinine, Ser 1.24 0.61 - 1.24 mg/dL   Calcium 8.8 (L) 8.9 - 10.3 mg/dL   Total Protein 6.4 (L) 6.5 - 8.1 g/dL   Albumin 3.7 3.5 - 5.0 g/dL   AST 14 (L) 15 - 41 U/L   ALT 18 0 - 44 U/L   Alkaline Phosphatase 64 38 - 126 U/L   Total Bilirubin 1.3 (H) 0.3 - 1.2 mg/dL   GFR calc non Af [name] >60 >60 mL/min   GFR calc Af [name] >60 >60 mL/min   Anion gap 13 5 - 15    Comment: Performed at [facility], [place] [address] [place],  [address]  I-stat chem 8, ED  Status: Abnormal   Collection Time: 02/28/19  7:44 PM  Result Value Ref Range   Sodium 136 135 - 145 mmol/L   Potassium 3.1 (L) 3.5 - 5.1 mmol/L   Chloride 97 (L) 98 - 111 mmol/L   BUN 22 (H) 6 - 20 mg/dL   Creatinine, Ser 1.10 0.61 - 1.24 mg/dL   Glucose, Bld 233 (H) 70 - 99 mg/dL   Calcium, Ion 1.04 (L) 1.15 - 1.40 mmol/L   TCO2 28 22 - 32 mmol/L   Hemoglobin 12.6 (L) 13.0 - 17.0 g/dL   HCT 37.0 (L) 39.0 - 52.0 %   Ct Head Wo Contrast  Result Date: 02/28/2019 CLINICAL DATA:  One-week history of positional vertigo. EXAM: CT HEAD WITHOUT CONTRAST TECHNIQUE: Contiguous axial images were obtained from the base of the skull through the vertex without intravenous contrast. COMPARISON:  MRI brain 02/17/2019. CTA head and neck including CT brain 02/17/2019. FINDINGS: Brain: Geographic low attenuation involving the LEFT INFERIOR cerebellum, a new finding since the prior examinations 11 days ago. Minimal mass effect. No associated hemorrhage. Ventricular system normal in size and appearance for age. No extra-axial fluid collections. No focal brain parenchymal abnormalities elsewhere. Vascular: No visible atherosclerosis. No hyperdense vessels. Skull: No skull fracture or other focal osseous abnormality involving the skull. Sinuses/Orbits: Visualized paranasal sinuses, bilateral mastoid air  cells and bilateral middle ear cavities well-aerated. Benign senile calcification involving the RIGHT eye. Visualized orbits and globes otherwise normal in appearance. Other: None. IMPRESSION: 1. Acute/subacute nonhemorrhagic stroke involving the LEFT INFERIOR cerebellum. 2. Otherwise normal examination. Electronically Signed   By: Evangeline Dakin M.D.   On: 02/28/2019 18:22   Mr Angio Head Wo Contrast  Result Date: 03/01/2019 CLINICAL DATA:  Initial evaluation for EXAM: MRI HEAD WITHOUT CONTRAST MRA HEAD WITHOUT CONTRAST TECHNIQUE: Multiplanar, multiecho pulse sequences of the brain and surrounding structures were obtained without intravenous contrast. Angiographic images of the head were obtained using MRA technique without contrast. COMPARISON:  Comparison made with prior CT from earlier the same day as well as previous MRI from 02/17/2019. FINDINGS: MRI HEAD FINDINGS Brain: Generalized age-related cerebral atrophy with mild to moderate chronic microvascular ischemic disease. Confluent restricted diffusion involving the inferior left cerebellum compatible with left PICA territory infarct, early subacute in appearance. Associated mild localized edema, with mild mass effect on the adjacent fourth ventricular outflow tract. Fourth ventricle remains patent at this time with no evidence for obstructive hydrocephalus. Associated prominent petechial hemorrhage on SWI sequence without frank hemorrhagic transformation. Multiple additional scattered predominantly subcentimeter acute to early subacute ischemic infarcts seen elsewhere throughout the bilateral cerebral hemispheres as well as the right cerebellum. Largest of these foci within the right cerebral hemisphere measures 1 cm and is positioned within the mid right corona radiata (series 5, image 79). Largest focus within the left cerebral hemisphere involves the left periatrial white matter and measures 8 mm (series 5, image 72). Punctate 5 mm infarct within the  mid right cerebellum (series 5, image 58). No made of a 6 mm ischemic infarct involving the left paramedian pons (series 5, image 65). No associated mass effect or hemorrhage about these additional infarcts. Gray-white matter differentiation otherwise maintained. Additional single chronic microhemorrhage noted within the right cerebellum. No mass lesion or midline shift. No hydrocephalus. No extra-axial fluid collection. Pituitary gland suprasellar region within normal limits. Midline structures intact. Vascular: Major intracranial vascular flow voids are maintained. Skull and upper cervical spine: Craniocervical junction within normal limits. Upper cervical spine normal. Bone  marrow signal intensity within normal limits. No scalp soft tissue abnormality. Sinuses/Orbits: Globes orbital soft tissues demonstrate no acute finding. Axial myopia noted. Mild scattered mucosal thickening within the ethmoidal air cells and maxillary sinuses. Paranasal sinuses are otherwise clear. No mastoid effusion. Inner ear structures grossly normal. Other: None. MRA HEAD FINDINGS ANTERIOR CIRCULATION: Examination mildly degraded by motion artifact. Distal cervical segments of the internal carotid arteries are patent with symmetric antegrade flow. Petrous segments patent bilaterally without flow-limiting stenosis. Scattered atheromatous irregularity throughout the cavernous/supraclinoid ICAs bilaterally. No significant stenosis on the right. There is moderate multifocal narrowing involving the cavernous/supraclinoid left ICA (up to approximately 50%). ICA termini well perfused. A1 segments patent bilaterally. Normal anterior communicating artery. Anterior cerebral arteries patent to their distal aspects without stenosis. No M1 stenosis or occlusion. Normal MCA bifurcations. Distal MCA branches well perfused and symmetric. Distal small vessel atheromatous irregularity noted. POSTERIOR CIRCULATION: Vertebral arteries patent to the  vertebrobasilar junction without stenosis. Left vertebral artery slightly dominant. Right PICA patent. Left PICA not visualized, suspected to be occluded. Proximal and mid basilar artery widely patent. Moderate focal stenosis noted at the basilar tip (series 1075, image 11). Superior cerebral arteries patent bilaterally. Both of the posterior cerebral arteries primarily supplied via the basilar. PCAs demonstrate mild atheromatous irregularity but are well perfused to their distal aspects without high-grade stenosis. No intracranial aneurysm. IMPRESSION: MRI HEAD IMPRESSION: 1. 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 Associated edema with mild mass effect on the adjacent fourth ventricular outflow tract. No associated obstructive hydrocephalus at this time. Associated prominent petechial hemorrhage without frank hemorrhagic transformation. 2. Additional scattered predominantly subcentimeter acute to early subacute ischemic infarcts involving the bilateral cerebral hemispheres, brainstem, and right cerebellum. Given the various vascular distributions involved, a central thromboembolic source is suspected. 3. Underlying mild to moderate chronic microvascular ischemic disease. MRA HEAD IMPRESSION: 1. Nonvisualization of the left PICA, suspected to be occluded given the left PICA territory infarct. 2. Moderate atherosclerotic change involving the left carotid siphon with associated moderate multifocal narrowing (approximate up to 50%). 3. Focal moderate basilar tip stenosis. 4. Additional scattered small vessel atheromatous irregularity elsewhere throughout the intracranial circulation. No other hemodynamically significant or correctable stenosis. Electronically Signed   By: Jeannine Boga M.D.   On: 03/01/2019 00:40   Mr Brain Wo Contrast  Result Date: 03/01/2019 CLINICAL DATA:  Initial evaluation for EXAM: MRI HEAD WITHOUT CONTRAST MRA HEAD  WITHOUT CONTRAST TECHNIQUE: Multiplanar, multiecho pulse sequences of the brain and surrounding structures were obtained without intravenous contrast. Angiographic images of the head were obtained using MRA technique without contrast. COMPARISON:  Comparison made with prior CT from earlier the same day as well as previous MRI from 02/17/2019. FINDINGS: MRI HEAD FINDINGS Brain: Generalized age-related cerebral atrophy with mild to moderate chronic microvascular ischemic disease. Confluent restricted diffusion involving the inferior left cerebellum compatible with left PICA territory infarct, early subacute in appearance. Associated mild localized edema, with mild mass effect on the adjacent fourth ventricular outflow tract. Fourth ventricle remains patent at this time with no evidence for obstructive hydrocephalus. Associated prominent petechial hemorrhage on SWI sequence without frank hemorrhagic transformation. Multiple additional scattered predominantly subcentimeter acute to early subacute ischemic infarcts seen elsewhere throughout the bilateral cerebral hemispheres as well as the right cerebellum. Largest of these foci within the right cerebral hemisphere measures 1 cm and is positioned within the mid right corona radiata (series 5, image 79). Largest focus within the left cerebral  hemisphere involves the left periatrial white matter and measures 8 mm (series 5, image 72). Punctate 5 mm infarct within the mid right cerebellum (series 5, image 58). No made of a 6 mm ischemic infarct involving the left paramedian pons (series 5, image 65). No associated mass effect or hemorrhage about these additional infarcts. Gray-white matter differentiation otherwise maintained. Additional single chronic microhemorrhage noted within the right cerebellum. No mass lesion or midline shift. No hydrocephalus. No extra-axial fluid collection. Pituitary gland suprasellar region within normal limits. Midline structures intact.  Vascular: Major intracranial vascular flow voids are maintained. Skull and upper cervical spine: Craniocervical junction within normal limits. Upper cervical spine normal. Bone marrow signal intensity within normal limits. No scalp soft tissue abnormality. Sinuses/Orbits: Globes orbital soft tissues demonstrate no acute finding. Axial myopia noted. Mild scattered mucosal thickening within the ethmoidal air cells and maxillary sinuses. Paranasal sinuses are otherwise clear. No mastoid effusion. Inner ear structures grossly normal. Other: None. MRA HEAD FINDINGS ANTERIOR CIRCULATION: Examination mildly degraded by motion artifact. Distal cervical segments of the internal carotid arteries are patent with symmetric antegrade flow. Petrous segments patent bilaterally without flow-limiting stenosis. Scattered atheromatous irregularity throughout the cavernous/supraclinoid ICAs bilaterally. No significant stenosis on the right. There is moderate multifocal narrowing involving the cavernous/supraclinoid left ICA (up to approximately 50%). ICA termini well perfused. A1 segments patent bilaterally. Normal anterior communicating artery. Anterior cerebral arteries patent to their distal aspects without stenosis. No M1 stenosis or occlusion. Normal MCA bifurcations. Distal MCA branches well perfused and symmetric. Distal small vessel atheromatous irregularity noted. POSTERIOR CIRCULATION: Vertebral arteries patent to the vertebrobasilar junction without stenosis. Left vertebral artery slightly dominant. Right PICA patent. Left PICA not visualized, suspected to be occluded. Proximal and mid basilar artery widely patent. Moderate focal stenosis noted at the basilar tip (series 1075, image 11). Superior cerebral arteries patent bilaterally. Both of the posterior cerebral arteries primarily supplied via the basilar. PCAs demonstrate mild atheromatous irregularity but are well perfused to their distal aspects without high-grade  stenosis. No intracranial aneurysm. IMPRESSION: MRI HEAD IMPRESSION: 1. 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 Associated edema with mild mass effect on the adjacent fourth ventricular outflow tract. No associated obstructive hydrocephalus at this time. Associated prominent petechial hemorrhage without frank hemorrhagic transformation. 2. Additional scattered predominantly subcentimeter acute to early subacute ischemic infarcts involving the bilateral cerebral hemispheres, brainstem, and right cerebellum. Given the various vascular distributions involved, a central thromboembolic source is suspected. 3. Underlying mild to moderate chronic microvascular ischemic disease. MRA HEAD IMPRESSION: 1. Nonvisualization of the left PICA, suspected to be occluded given the left PICA territory infarct. 2. Moderate atherosclerotic change involving the left carotid siphon with associated moderate multifocal narrowing (approximate up to 50%). 3. Focal moderate basilar tip stenosis. 4. Additional scattered small vessel atheromatous irregularity elsewhere throughout the intracranial circulation. No other hemodynamically significant or correctable stenosis. Electronically Signed   By: Jeannine Boga M.D.   On: 03/01/2019 00:40   Dg Chest Port 1 View  Result Date: 02/28/2019 CLINICAL DATA:  Dizziness for the past week. Hypertension. EXAM: PORTABLE CHEST 1 VIEW COMPARISON:  02/22/2012. FINDINGS: Interval mildly enlarged cardiac silhouette and mildly prominent pulmonary vasculature. Clear lungs. No pleural fluid. Unremarkable bones. IMPRESSION: Interval mild cardiomegaly and mild pulmonary vascular congestion. Electronically Signed   By: Claudie Revering M.D.   On: 02/28/2019 13:44    Assessment: 58 y.o. male presenting with relatively large left cerebellar subacute ischemic stroke. At risk  for mass effect with brainstem herniation and/or hydrocephalus over an  approximate 7 day time window with highest risk at about day 3-4 post-stroke. Most likely etiology is cardioembolic given multifocal cerebral hemispheric subacute strokes seen on follow up MRI.  1. CT head revealed a subacute nonhemorrhagic stroke involving the LEFT INFERIOR cerebellum, which is relatively large in size. 2. MRI brain: Moderate-size confluent early subacute left PICA territory infarct involving the inferior left cerebellum. Associated edema with mild mass effect on the adjacent fourth ventricular outflow tract. No associated obstructive hydrocephalus at this time. Associated prominent petechial hemorrhage without frank hemorrhagic transformation. Additional scattered predominantly subcentimeter acute to early subacute ischemic infarcts involving the bilateral cerebral hemispheres, brainstem, and right cerebellum. Given the various vascular distributions involved, a central thromboembolic source is suspected.   3. MRA head: Nonvisualization of the left PICA, suspected to be occluded given the left PICA territory infarct. Moderate atherosclerotic change involving the left carotid siphon with associated moderate multifocal narrowing (approximate up to 50%). Focal moderate basilar tip stenosis. Additional scattered small vessel atheromatous irregularity elsewhere throughout the intracranial circulation.  4. Stroke Risk Factors - HLD, HTN and prediabetes 5. His episode of amaurosis fugax precipitating his admission on 7/7 most likely was due to a cardioembolic etiology, based on the strokes seen on MRI this admission.   Recommendations: 1. Repeat CT head at noon today to assess for stability (ordered). If no change, then next scanning interval can be at 24 hours 2. Hold off on hypertonic saline for now. If there is increased edema at follow up CT in 12 hours, then will need to reconsider.  3. Will need long term anticoagulation most likely if cardioembolic source is verified by work up. Hold off  on anticoagulation for now given high risk of further hemorrhagic conversion of the left cerebellar stroke with resultant mass effect and high risk for herniation/hydrocephalus.  4. TTE. If negative, obtain TEE 5. Carotid dopplers 6. PT consult, OT consult, Speech consult 7. Risk factor modification 8. Telemetry monitoring 9. Frequent neuro checks 10. HgbA1c, fasting lipid panel 11. Carotid ultrasound 12. Not a good permissive HTN candidate given high risk of further hemorrhagic transformation of the left cerebellar stroke. SBP goal of < 160 with clevidipine drip.    @Electronically  signed: Dr. Kerney Elbe  03/01/2019, 2:30 AM

## 2019-03-01 NOTE — Plan of Care (Signed)
  Problem: Education: Goal: Knowledge of secondary prevention will improve Outcome: Progressing

## 2019-03-01 NOTE — Evaluation (Signed)
Occupational Therapy Evaluation Patient Details Name: Gabriel Hoover MRN: [id number] DOB: 08/18/1960 Today's Date: 03/01/2019    History of Present Illness Patient is a 58 y/o male presenting with ongoing dizziness x 1 week in conjunction with HTN. PMH of DM2 and HTN. CT head revealed a subacute nonhemorrhagic stroke involving the LEFT INFERIOR cerebellum, which is relatively large in size. MRI brain: Moderate-size confluent early subacute left PICA territory infarct involving the inferior left cerebellum.    Clinical Impression   Pt PTA: working and independent. Pt currently with cognitive deficits in sequencing and short term memory; coordination, spatial and proprioceptive deficits. Pt ambulating with RW with minA to modA +2 for stability. ModA overall for ADL. Pt with poor fine motor coordination for ADL at EOB and grooming in standing at sink. Pt with anterior lean and unsafe for mobility transfers without assist. Pt with poor awareness of deficits and requires cues to sequence through tasks. Pt unable to recall 3 items after 3 mins of recall. Pt follows simple commands with increased time. Pt would greatly benefit from continued OT skilled services for ADL, mobility and safety in CIR setting. OT following acutely.      Follow Up Recommendations  CIR;Supervision/Assistance - 24 hour    Equipment Recommendations  Other (comment)(to be determined)    Recommendations for Other Services       Precautions / Restrictions Precautions Precautions: Fall Restrictions Weight Bearing Restrictions: No      Mobility Bed Mobility Overal bed mobility: Needs Assistance Bed Mobility: Supine to Sit;Sit to Supine     Supine to sit: Min guard Sit to supine: Min guard   General bed mobility comments: min guard for trunk steadying to come into sitting   Transfers Overall transfer level: Needs assistance Equipment used: Rolling walker (2 wheeled) Transfers: Sit to/from [employer] Sit to Stand: Min assist;+2 physical assistance;+2 safety/equipment Stand pivot transfers: Min assist;+2 physical assistance;+2 safety/equipment       General transfer comment: Min A needed for steadying and safety; patient with forward lean with mobility; poor spatial awareness;    Balance Overall balance assessment: Needs assistance Sitting-balance support: Bilateral upper extremity supported;Feet supported Sitting balance-Leahy Scale: Fair   Postural control: Right lateral lean Standing balance support: Bilateral upper extremity supported;During functional activity Standing balance-Leahy Scale: Poor Standing balance comment: reliant on RW and external support from PT/OT                           ADL either performed or assessed with clinical judgement   ADL Overall ADL's : Needs assistance/impaired Eating/Feeding: Minimal assistance;Sitting   Grooming: Minimal assistance;Wash/dry hands;Wash/dry face;Oral care;Standing Grooming Details (indicate cue type and reason): Difficulty  with coordination and performing teeth brushing Upper Body Bathing: Minimal assistance;Sitting   Lower Body Bathing: Moderate assistance;Sitting/lateral leans;Sit to/from stand   Upper Body Dressing : Minimal assistance;Sitting   Lower Body Dressing: Moderate assistance;Sitting/lateral leans;Sit to/from stand;Cueing for safety;Cueing for sequencing Lower Body Dressing Details (indicate cue type and reason): difficulty with leaning over self to don socks from EOB. Pt with decreased coordination and poor sitting balance. ppt requiring minA to start the sock as hands unable to properly grasp sock Toilet Transfer: Minimal assistance;+2 for physical assistance;+2 for safety/equipment;Stand-pivot;Cueing for safety;Cueing for sequencing;Grab bars;Regular Toilet;RW Toilet Transfer Details (indicate cue type and reason): stood for toileting task Toileting- Clothing Manipulation and Hygiene:  Moderate assistance;Sitting/lateral lean;Sit to/from stand;Cueing for safety       Functional  mobility during ADLs: Minimal assistance;Moderate assistance;+2 for physical assistance;+2 for safety/equipment;Cueing for safety;Cueing for sequencing;Rolling walker General ADL Comments: pt limited by poor coordination, depth perception, visuospatial and proprioception requiring assist for mobility and ADL.     Vision Baseline Vision/History: No visual deficits Vision Assessment?: Yes Eye Alignment: Within Functional Limits Ocular Range of Motion: Within Functional Limits Alignment/Gaze Preference: Within Defined Limits Tracking/Visual Pursuits: Able to track stimulus in all quads without difficulty Depth Perception: Overshoots;Undershoots     Perception Perception Perception Tested?: Yes Perception Deficits: Spatial orientation Spatial deficits: undershoots and overshoots. pt unable to replicate proprioceptive task   Praxis      Pertinent Vitals/Pain Pain Assessment: No/denies pain     Hand Dominance     Extremity/Trunk Assessment Upper Extremity Assessment Upper Extremity Assessment: Generalized weakness;RUE deficits/detail;LUE deficits/detail RUE Deficits / Details: poor coordination, poor perception and poor depth perception RUE Coordination: decreased fine motor;decreased gross motor LUE Deficits / Details: poor coordination, poor perception and poor depth perception LUE Coordination: decreased fine motor;decreased gross motor   Lower Extremity Assessment Lower Extremity Assessment: Defer to PT evaluation   Cervical / Trunk Assessment Cervical / Trunk Assessment: Normal   Communication Communication Communication: No difficulties   Cognition Arousal/Alertness: Awake/alert Behavior During Therapy: Flat affect Overall Cognitive Status: Impaired/Different from baseline Area of Impairment: Following commands;Safety/judgement;Problem solving                        Following Commands: Follows one step commands with increased time Safety/Judgement: Decreased awareness of safety;Decreased awareness of deficits   Problem Solving: Slow processing;Decreased initiation;Difficulty sequencing;Requires verbal cues General Comments: states he feels like he is in "[place]". Pt recalling 3/3/ items immediately and 0/3 after 3 mins.   General Comments  BP up to 177/112 with mobility, decreased to 164/108 with return to supine all other VSS - nursing notified    Exercises     Shoulder Instructions      Home Living Family/patient expects to be discharged to:: Private residence Living Arrangements: Spouse/significant other Available Help at Discharge: Family;Available 24 hours/day Type of Home: House Home Access: Stairs to enter [employer] of Steps: 3-4   Home Layout: One level     Bathroom Shower/Tub: Teacher, early years/pre: Standard     Home Equipment: Grab bars - tub/shower          Prior Functioning/Environment Level of Independence: Independent        Comments: builds houses        OT Problem List: Decreased strength;Decreased activity tolerance;Impaired balance (sitting and/or standing);Decreased safety awareness;Decreased coordination;Impaired UE functional use      OT Treatment/Interventions: Self-care/ADL training;Therapeutic exercise;Neuromuscular education;Energy conservation;Therapeutic activities;Patient/family education;Balance training;Cognitive remediation/compensation;Visual/perceptual remediation/compensation    OT Goals(Current goals can be found in the care plan section) Acute Rehab OT Goals Patient Stated Goal: none stated  OT Goal Formulation: With patient Time For Goal Achievement: 03/15/19 Potential to Achieve Goals: Good ADL Goals Pt Will Perform Eating: with modified independence;sitting Pt Will Perform Grooming: with modified independence;standing Pt Will Perform Upper Body Dressing:  with set-up;standing Pt Will Perform Lower Body Dressing: with supervision;sit to/from stand Pt Will Transfer to Toilet: with min guard assist;ambulating;regular height toilet Pt/caregiver will Perform Home Exercise Program: Right Upper extremity;Left upper extremity;With Supervision;With theraputty;With written HEP provided Additional ADL Goal #1: Pt will increase to supervision level for OOB ADL with 1-2 verbal cues for sequencing/safety.  OT Frequency: Min 2X/week   Barriers to D/C:  Co-evaluation PT/OT/SLP Co-Evaluation/Treatment: Yes Reason for Co-Treatment: Complexity of the patient's impairments (multi-system involvement)   OT goals addressed during session: ADL's and self-care      AM-PAC OT "6 Clicks" Daily Activity     Outcome Measure Help from another person eating meals?: A Little Help from another person taking care of personal grooming?: A Little Help from another person toileting, which includes using toliet, bedpan, or urinal?: A Little Help from another person bathing (including washing, rinsing, drying)?: A Lot Help from another person to put on and taking off regular upper body clothing?: A Lot Help from another person to put on and taking off regular lower body clothing?: A Lot 6 Click Score: 15   End of Session Equipment Utilized During Treatment: Gait belt;Rolling walker Nurse Communication: Mobility status;Other (comment)(high BP)  Activity Tolerance: Treatment limited secondary to medical complications (Comment);Patient tolerated treatment well Patient left: in bed;with call bell/phone within reach;with bed alarm set  OT Visit Diagnosis: Unsteadiness on feet (R26.81);Other symptoms and signs involving cognitive function;Other symptoms and signs involving the nervous system (R29.898)                Time: 7579-7282 OT Time Calculation (min): 30 min Charges:  OT General Charges $OT Visit: 1 Visit OT Evaluation $OT Eval Moderate Complexity: 1  Mod  Darryl Nestle) Marsa Aris OTR/L Acute Rehabilitation Services Pager: [phone] Office: [phone]   Jenene Slicker Roan [place]chuk 03/01/2019, 2:00 PM

## 2019-03-01 NOTE — Progress Notes (Signed)
Inpatient Diabetes Program Recommendations  AACE/ADA: New Consensus Statement on Inpatient Glycemic Control (2015)  Target Ranges:  Prepandial:   less than 140 mg/dL      Peak postprandial:   less than 180 mg/dL (1-2 hours)      Critically ill patients:  140 - 180 mg/dL   Lab Results  Component Value Date   GLUCAP 176 (H) 03/01/2019   HGBA1C 10.1 (H) 03/01/2019    Review of Glycemic Control  Diabetes history: DM2 Outpatient Diabetes medications: None. MD had prescribed metformin, but pt declined.  Current orders for Inpatient glycemic control: Novolog 0-15 units tidwc and 0-5 units QHS  HgbA1C - 10.1% - uncontrolled.  Inpatient Diabetes Program Recommendations:     Novolog 0-15 units Q4H when NPO. Add CHO mod med to heart healthy diet.  Will speak with pt regarding his HgbA1C of 10.1% on 7/20. Will order Living Well with Diabetes book. Needs lifestyle modification with diet, exercise and weight loss.  Continue to follow.   Thank you. Lorenda Peck, RD, LDN, CDE Inpatient Diabetes Coordinator [phone]

## 2019-03-01 NOTE — Evaluation (Signed)
Physical Therapy Evaluation Patient Details Name: Gabriel Hoover MRN: [id number] DOB: 09/29/1960 Today's Date: 03/01/2019   History of Present Illness  Patient is a 58 y/o male presenting with ongoing dizziness x 1 week in conjunction with HTN. PMH of DM2 and HTN. CT head revealed a subacute nonhemorrhagic stroke involving the LEFT INFERIOR cerebellum, which is relatively large in size. MRI brain: Moderate-size confluent early subacute left PICA territory infarct involving the inferior left cerebellum.     Clinical Impression  Patient admitted with the above listed diagnosis. Patient reports independence at baseline for mobility and ADLs. Patient today agreeable to OOB mobility to restroom. Patient requiring Min/Mod A +2 for mobility due to reduced balance, decreased safety awareness, anterior bias, as well as poor spatial awareness. Patient with increase in BP with mobility (see general comments) with nursing notified. Due to current functional status and need for physical assist for mobility will recommend CIR level therapies at discharge. PT to continue to follow.       Follow Up Recommendations CIR    Equipment Recommendations  Other (comment)(defer)    Recommendations for Other Services Rehab consult     Precautions / Restrictions Precautions Precautions: Fall Restrictions Weight Bearing Restrictions: No      Mobility  Bed Mobility Overal bed mobility: Needs Assistance Bed Mobility: Supine to Sit;Sit to Supine     Supine to sit: Min guard Sit to supine: Min guard   General bed mobility comments: min guard for trunk steadying to come into sitting   Transfers Overall transfer level: Needs assistance Equipment used: Rolling walker (2 wheeled) Transfers: Sit to/from [employer] Sit to Stand: Min assist;+2 physical assistance;+2 safety/equipment Stand pivot transfers: Min assist;+2 physical assistance;+2 safety/equipment       General transfer comment:  Min A needed for steadying and safety; patient with forward lean with mobility; poor spatial awareness;  Ambulation/Gait Ambulation/Gait assistance: Min assist;Mod assist Gait Distance (Feet): 20 Feet Assistive device: Rolling walker (2 wheeled) Gait Pattern/deviations: Step-to pattern;Step-through pattern;Decreased stride length;Staggering left;Staggering right;Trunk flexed Gait velocity: decreased   General Gait Details: anterior weight shift throughout mobility; up to Mod A for standing balance at toilet. poor safety awareness  Stairs            Wheelchair Mobility    Modified Rankin (Stroke Patients Only) Modified Rankin (Stroke Patients Only) Pre-Morbid Rankin Score: No symptoms Modified Rankin: Moderately severe disability     Balance Overall balance assessment: Needs assistance Sitting-balance support: Bilateral upper extremity supported;Feet supported Sitting balance-Leahy Scale: Fair   Postural control: Right lateral lean Standing balance support: Bilateral upper extremity supported;During functional activity Standing balance-Leahy Scale: Poor Standing balance comment: reliant on RW and external support from PT/OT                             Pertinent Vitals/Pain Pain Assessment: No/denies pain    Home Living Family/patient expects to be discharged to:: Private residence Living Arrangements: Spouse/significant other Available Help at Discharge: Family;Available 24 hours/day Type of Home: House Home Access: Stairs to enter   [employer] of Steps: 3-4 Home Layout: One level Home Equipment: Grab bars - tub/shower      Prior Function Level of Independence: Independent         Comments: builds houses     Journalist, newspaper        Extremity/Trunk Assessment   Upper Extremity Assessment Upper Extremity Assessment: Defer to OT evaluation  Lower Extremity Assessment Lower Extremity Assessment: RLE deficits/detail;LLE  deficits/detail RLE Coordination: decreased gross motor;decreased fine motor LLE Coordination: decreased gross motor;decreased fine motor    Cervical / Trunk Assessment Cervical / Trunk Assessment: Normal  Communication   Communication: No difficulties  Cognition Arousal/Alertness: Awake/alert Behavior During Therapy: Flat affect Overall Cognitive Status: Impaired/Different from baseline Area of Impairment: Following commands;Safety/judgement;Problem solving                       Following Commands: Follows one step commands with increased time Safety/Judgement: Decreased awareness of safety;Decreased awareness of deficits   Problem Solving: Slow processing;Decreased initiation;Difficulty sequencing;Requires verbal cues General Comments: states he feels like he is in "[place]"      General Comments General comments (skin integrity, edema, etc.): BP up to 177/112 with mobility, decreased to 164/108 with return to supine all other VSS - nursing notified    Exercises     Assessment/Plan    PT Assessment Patient needs continued PT services  PT Problem List Decreased strength;Decreased activity tolerance;Decreased balance;Decreased mobility;Decreased knowledge of use of DME;Decreased safety awareness       PT Treatment Interventions DME instruction;Gait training;Stair training;Functional mobility training;Therapeutic activities;Therapeutic exercise;Balance training;Patient/family education    PT Goals (Current goals can be found in the Care Plan section)  Acute Rehab PT Goals Patient Stated Goal: none stated  PT Goal Formulation: With patient Time For Goal Achievement: 03/15/19 Potential to Achieve Goals: Good    Frequency Min 4X/week   Barriers to discharge        Co-evaluation PT/OT/SLP Co-Evaluation/Treatment: Yes Reason for Co-Treatment: Complexity of the patient's impairments (multi-system involvement);Necessary to address cognition/behavior during  functional activity;For patient/therapist safety;To address functional/ADL transfers PT goals addressed during session: Mobility/safety with mobility;Balance;Proper use of DME         AM-PAC PT "6 Clicks" Mobility  Outcome Measure Help needed turning from your back to your side while in a flat bed without using bedrails?: A Little Help needed moving from lying on your back to sitting on the side of a flat bed without using bedrails?: A Little Help needed moving to and from a bed to a chair (including a wheelchair)?: A Little Help needed standing up from a chair using your arms (e.g., wheelchair or bedside chair)?: A Little Help needed to walk in hospital room?: A Lot Help needed climbing 3-5 steps with a railing? : Total 6 Click Score: 15    End of Session Equipment Utilized During Treatment: Gait belt Activity Tolerance: Patient tolerated treatment well Patient left: in bed;with call bell/phone within reach;with bed alarm set Nurse Communication: Mobility status PT Visit Diagnosis: Unsteadiness on feet (R26.81);Other abnormalities of gait and mobility (R26.89);Muscle weakness (generalized) (M62.81)    Time: 0973-5329 PT Time Calculation (min) (ACUTE ONLY): 27 min   Charges:   PT Evaluation $PT Eval Moderate Complexity: 1 Mod           Lanney Gins, PT, DPT Supplemental Physical Therapist 03/01/19 10:38 AM Pager: [phone] Office: [phone]

## 2019-03-01 NOTE — Progress Notes (Signed)
PROGRESS NOTE    Gabriel Hoover  QJJ:[id number] DOB: Jul 02, 1961 DOA: 02/28/2019 PCP: Unk Pinto, MD   Brief Narrative:    Patient is a 58 year old male with history of hypertension, hyperlipidemia, anxiety disorder, migraine headaches, prediabetes who presented to the emergency department with complaints of dizziness for last 3 days.  He was noted to be hypertensive on presentation.  CT head showed acute to subacute nonhemorrhagic stroke involving the left inferior cerebellum.  MRI of the brain showed left cerebellar stroke.  Neurology consulted.  Undergoing stroke work-up.  Assessment & Plan:   Principal Problem:   Acute cerebrovascular accident (CVA) ([place]) Active Problems:   Hyperlipidemia   Hypertension   T2_NIDDM ([place])   Poor compliance with medication   Acute cerebral infarct: MRI showed:Moderate-size confluent early subacute left PICA territory infarct involving the inferior left cerebellum. Associated edema with mild mass effect on the adjacent fourth ventricular outflow tract. No associated obstructive hydrocephalus at this time. Associated prominent petechial hemorrhage without frank hemorrhagic transformation.Additional scattered predominantly subcentimeter acute to early subacute ischemic infarcts involving the bilateral cerebral hemispheres, brainstem, and right cerebellum.  Stroke work-up initiated.  He was outside the window for TPA administration.  Neurology following.  Started on aspirin and plavix.  PT/OT/speech evaluation.  Echocardiogram done which showed EF of more than 65%, impaired relaxation, no embolic source.  Carotid Doppler pending. EKG showed normal sinus rhythm. Patient needs TEE.  Most likely source is embolic. Hemoglobin A1c of 10.2.  LDL of 59. Started on Lipitor. PT/OT recommended CIR on discharge.  Hypertensive urgency: Blood pressure in the range of 200/100s on presentation.  Allow permissive hypertension.  Continue PRN meds.  Gradually  normalize blood pressure in 5 to 7 days.  Hyperglycemia: Hemoglobin C of 10.1.  Continue sliding scale insulin for now.  Most likely needs insulin on discharge.  Not on any medicines at home.  Will request for diabetic coordinator consultation.  Hyperlipidemia: Continue statin  Hypokalemia: Supplemented with potassium.         DVT prophylaxis: Lovenox Code Status: Full Family Communication: None Disposition Plan: CIR when full work-up done   Consultants: Neurology  Procedures: MRI  Antimicrobials:  Anti-infectives (From admission, onward)   None      Subjective: Patient seen and examined at bedside this morning.  Hemodynamically stable.  Comfortable during my evaluation.  Denies any complaints.  Does not have any focal neurological deficits.  Objective: Vitals:   03/01/19 0312 03/01/19 0400 03/01/19 0412 03/01/19 0805  BP: (!) 153/78   (!) 163/98  Pulse:    82  Resp:      Temp:   99 F (37.2 C) 98.2 F (36.8 C)  TempSrc:    Oral  SpO2: 93% 96%  98%  Weight:  81.7 kg    Height:        Intake/Output Summary (Last 24 hours) at 03/01/2019 1119 Last data filed at 03/01/2019 0400 Gross per 24 hour  Intake 333.64 ml  Output 220 ml  Net 113.64 ml   Filed Weights   02/28/19 1308 03/01/19 0400  Weight: 83.9 kg 81.7 kg    Examination:  General exam: Appears calm and comfortable ,Not in distress,average built HEENT:PERRL,Oral mucosa moist, Ear/Nose normal on gross exam Respiratory system: Bilateral equal air entry, normal vesicular breath sounds, no wheezes or crackles  Cardiovascular system: S1 & S2 heard, RRR. No JVD, murmurs, rubs, gallops or clicks. No pedal edema. Gastrointestinal system: Abdomen is nondistended, soft and nontender. No organomegaly or masses felt.  Normal bowel sounds heard. Central nervous system: Alert and oriented. No focal neurological deficits. Extremities: No edema, no clubbing ,no cyanosis, distal peripheral pulses palpable. Skin: No  rashes, lesions or ulcers,no icterus ,no pallor     Data Reviewed: I have personally reviewed following labs and imaging studies  CBC: Recent Labs  Lab 02/28/19 1355 02/28/19 1930 02/28/19 1944 03/01/19 0536  WBC  --  11.4*  --  11.1*  NEUTROABS  --  9.0*  --   --   HGB 12.9* 13.8 12.6* 12.9*  HCT 38.0* 37.3* 37.0* 35.4*  MCV  --  80.6  --  80.5  PLT  --  244  --  417   Basic Metabolic Panel: Recent Labs  Lab 02/28/19 1355 02/28/19 1930 02/28/19 1944 03/01/19 0536  NA 137 134* 136 135  K 3.4* 3.0* 3.1* 3.0*  CL  --  96* 97* 100  CO2  --  25  --  25  GLUCOSE  --  249* 233* 236*  BUN  --  22* 22* 19  CREATININE  --  1.24 1.10 1.11  CALCIUM  --  8.8*  --  8.4*   GFR: Estimated Creatinine Clearance: 73.4 mL/min (by C-G formula based on SCr of 1.11 mg/dL). Liver Function Tests: Recent Labs  Lab 02/28/19 1930 03/01/19 0536  AST 14* 15  ALT 18 17  ALKPHOS 64 64  BILITOT 1.3* 1.1  PROT 6.4* 5.8*  ALBUMIN 3.7 3.3*   No results for input(s): LIPASE, AMYLASE in the last 168 hours. No results for input(s): AMMONIA in the last 168 hours. Coagulation Profile: Recent Labs  Lab 02/28/19 1930  INR 1.1   Cardiac Enzymes: No results for input(s): CKTOTAL, CKMB, CKMBINDEX, TROPONINI in the last 168 hours. BNP (last 3 results) No results for input(s): PROBNP in the last 8760 hours. HbA1C: Recent Labs    03/01/19 0536  HGBA1C 10.1*   CBG: No results for input(s): GLUCAP in the last 168 hours. Lipid Profile: Recent Labs    03/01/19 0536  CHOL 114  HDL 39*  LDLCALC 59  TRIG 80  CHOLHDL 2.9   Thyroid Function Tests: No results for input(s): TSH, T4TOTAL, FREET4, T3FREE, THYROIDAB in the last 72 hours. Anemia Panel: No results for input(s): VITAMINB12, FOLATE, FERRITIN, TIBC, IRON, RETICCTPCT in the last 72 hours. Sepsis Labs: No results for input(s): PROCALCITON, LATICACIDVEN in the last 168 hours.  Recent Results (from the past 240 hour(s))  SARS  Coronavirus 2 (CEPHEID - Performed in [place] hospital lab), Hosp Order     Status: None   Collection Time: 02/28/19  7:25 PM   Specimen: Nasopharyngeal Swab  Result Value Ref Range Status   SARS Coronavirus 2 NEGATIVE NEGATIVE Final    Comment: (NOTE) If result is NEGATIVE SARS-CoV-2 target nucleic acids are NOT DETECTED. The SARS-CoV-2 RNA is generally detectable in upper and lower  respiratory specimens during the acute phase of infection. The lowest  concentration of SARS-CoV-2 viral copies this assay can detect is 250  copies / mL. A negative result does not preclude SARS-CoV-2 infection  and should not be used as the sole basis for treatment or other  patient management decisions.  A negative result may occur with  improper specimen collection / handling, submission of specimen other  than nasopharyngeal swab, presence of viral mutation(s) within the  areas targeted by this assay, and inadequate number of viral copies  (<250 copies / mL). A negative result must be combined with clinical  observations, patient history, and epidemiological information. If result is POSITIVE SARS-CoV-2 target nucleic acids are DETECTED. The SARS-CoV-2 RNA is generally detectable in upper and lower  respiratory specimens dur ing the acute phase of infection.  Positive  results are indicative of active infection with SARS-CoV-2.  Clinical  correlation with patient history and other diagnostic information is  necessary to determine patient infection status.  Positive results do  not rule out bacterial infection or co-infection with other viruses. If result is PRESUMPTIVE POSTIVE SARS-CoV-2 nucleic acids MAY BE PRESENT.   A presumptive positive result was obtained on the submitted specimen  and confirmed on repeat testing.  While 2019 novel coronavirus  (SARS-CoV-2) nucleic acids may be present in the submitted sample  additional confirmatory testing may be necessary for epidemiological  and / or  clinical management purposes  to differentiate between  SARS-CoV-2 and other Sarbecovirus currently known to infect humans.  If clinically indicated additional testing with an alternate test  methodology [phone]) is advised. The SARS-CoV-2 RNA is generally  detectable in upper and lower respiratory sp ecimens during the acute  phase of infection. The expected result is Negative. Fact Sheet for Patients:  StrictlyIdeas.no Fact Sheet for Healthcare Providers: BankingDealers.co.za This test is not yet approved or cleared by the [place] FDA and has been authorized for detection and/or diagnosis of SARS-CoV-2 by FDA under an Emergency Use Authorization (EUA).  This EUA will remain in effect (meaning this test can be used) for the duration of the COVID-19 declaration under Section 564(b)(1) of the Act, 21 U.S.C. section 360bbb-3(b)(1), unless the authorization is terminated or revoked sooner. Performed at [facility], [place] [address] [place], [place] [address]          Radiology Studies: Ct Head Wo Contrast  Result Date: 02/28/2019 CLINICAL DATA:  One-week history of positional vertigo. EXAM: CT HEAD WITHOUT CONTRAST TECHNIQUE: Contiguous axial images were obtained from the base of the skull through the vertex without intravenous contrast. COMPARISON:  MRI brain 02/17/2019. CTA head and neck including CT brain 02/17/2019. FINDINGS: Brain: Geographic low attenuation involving the LEFT INFERIOR cerebellum, a new finding since the prior examinations 11 days ago. Minimal mass effect. No associated hemorrhage. Ventricular system normal in size and appearance for age. No extra-axial fluid collections. No focal brain parenchymal abnormalities elsewhere. Vascular: No visible atherosclerosis. No hyperdense vessels. Skull: No skull fracture or other focal osseous abnormality involving the skull. Sinuses/Orbits: Visualized paranasal sinuses,  bilateral mastoid air cells and bilateral middle ear cavities well-aerated. Benign senile calcification involving the RIGHT eye. Visualized orbits and globes otherwise normal in appearance. Other: None. IMPRESSION: 1. Acute/subacute nonhemorrhagic stroke involving the LEFT INFERIOR cerebellum. 2. Otherwise normal examination. Electronically Signed   By: Evangeline Dakin M.D.   On: 02/28/2019 18:22   Mr Angio Head Wo Contrast  Result Date: 03/01/2019 CLINICAL DATA:  Initial evaluation for EXAM: MRI HEAD WITHOUT CONTRAST MRA HEAD WITHOUT CONTRAST TECHNIQUE: Multiplanar, multiecho pulse sequences of the brain and surrounding structures were obtained without intravenous contrast. Angiographic images of the head were obtained using MRA technique without contrast. COMPARISON:  Comparison made with prior CT from earlier the same day as well as previous MRI from 02/17/2019. FINDINGS: MRI HEAD FINDINGS Brain: Generalized age-related cerebral atrophy with mild to moderate chronic microvascular ischemic disease. Confluent restricted diffusion involving the inferior left cerebellum compatible with left PICA territory infarct, early subacute in appearance. Associated mild localized edema, with mild mass effect on the adjacent fourth ventricular  outflow tract. Fourth ventricle remains patent at this time with no evidence for obstructive hydrocephalus. Associated prominent petechial hemorrhage on SWI sequence without frank hemorrhagic transformation. Multiple additional scattered predominantly subcentimeter acute to early subacute ischemic infarcts seen elsewhere throughout the bilateral cerebral hemispheres as well as the right cerebellum. Largest of these foci within the right cerebral hemisphere measures 1 cm and is positioned within the mid right corona radiata (series 5, image 79). Largest focus within the left cerebral hemisphere involves the left periatrial white matter and measures 8 mm (series 5, image 72). Punctate 5  mm infarct within the mid right cerebellum (series 5, image 58). No made of a 6 mm ischemic infarct involving the left paramedian pons (series 5, image 65). No associated mass effect or hemorrhage about these additional infarcts. Gray-white matter differentiation otherwise maintained. Additional single chronic microhemorrhage noted within the right cerebellum. No mass lesion or midline shift. No hydrocephalus. No extra-axial fluid collection. Pituitary gland suprasellar region within normal limits. Midline structures intact. Vascular: Major intracranial vascular flow voids are maintained. Skull and upper cervical spine: Craniocervical junction within normal limits. Upper cervical spine normal. Bone marrow signal intensity within normal limits. No scalp soft tissue abnormality. Sinuses/Orbits: Globes orbital soft tissues demonstrate no acute finding. Axial myopia noted. Mild scattered mucosal thickening within the ethmoidal air cells and maxillary sinuses. Paranasal sinuses are otherwise clear. No mastoid effusion. Inner ear structures grossly normal. Other: None. MRA HEAD FINDINGS ANTERIOR CIRCULATION: Examination mildly degraded by motion artifact. Distal cervical segments of the internal carotid arteries are patent with symmetric antegrade flow. Petrous segments patent bilaterally without flow-limiting stenosis. Scattered atheromatous irregularity throughout the cavernous/supraclinoid ICAs bilaterally. No significant stenosis on the right. There is moderate multifocal narrowing involving the cavernous/supraclinoid left ICA (up to approximately 50%). ICA termini well perfused. A1 segments patent bilaterally. Normal anterior communicating artery. Anterior cerebral arteries patent to their distal aspects without stenosis. No M1 stenosis or occlusion. Normal MCA bifurcations. Distal MCA branches well perfused and symmetric. Distal small vessel atheromatous irregularity noted. POSTERIOR CIRCULATION: Vertebral arteries  patent to the vertebrobasilar junction without stenosis. Left vertebral artery slightly dominant. Right PICA patent. Left PICA not visualized, suspected to be occluded. Proximal and mid basilar artery widely patent. Moderate focal stenosis noted at the basilar tip (series 1075, image 11). Superior cerebral arteries patent bilaterally. Both of the posterior cerebral arteries primarily supplied via the basilar. PCAs demonstrate mild atheromatous irregularity but are well perfused to their distal aspects without high-grade stenosis. No intracranial aneurysm. IMPRESSION: MRI HEAD IMPRESSION: 1. 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 Associated edema with mild mass effect on the adjacent fourth ventricular outflow tract. No associated obstructive hydrocephalus at this time. Associated prominent petechial hemorrhage without frank hemorrhagic transformation. 2. Additional scattered predominantly subcentimeter acute to early subacute ischemic infarcts involving the bilateral cerebral hemispheres, brainstem, and right cerebellum. Given the various vascular distributions involved, a central thromboembolic source is suspected. 3. Underlying mild to moderate chronic microvascular ischemic disease. MRA HEAD IMPRESSION: 1. Nonvisualization of the left PICA, suspected to be occluded given the left PICA territory infarct. 2. Moderate atherosclerotic change involving the left carotid siphon with associated moderate multifocal narrowing (approximate up to 50%). 3. Focal moderate basilar tip stenosis. 4. Additional scattered small vessel atheromatous irregularity elsewhere throughout the intracranial circulation. No other hemodynamically significant or correctable stenosis. Electronically Signed   By: Jeannine Boga M.D.   On: 03/01/2019 00:40   Mr Brain Wo Contrast  Result Date: 03/01/2019 CLINICAL DATA:  Initial evaluation for EXAM: MRI HEAD WITHOUT  CONTRAST MRA HEAD WITHOUT CONTRAST TECHNIQUE: Multiplanar, multiecho pulse sequences of the brain and surrounding structures were obtained without intravenous contrast. Angiographic images of the head were obtained using MRA technique without contrast. COMPARISON:  Comparison made with prior CT from earlier the same day as well as previous MRI from 02/17/2019. FINDINGS: MRI HEAD FINDINGS Brain: Generalized age-related cerebral atrophy with mild to moderate chronic microvascular ischemic disease. Confluent restricted diffusion involving the inferior left cerebellum compatible with left PICA territory infarct, early subacute in appearance. Associated mild localized edema, with mild mass effect on the adjacent fourth ventricular outflow tract. Fourth ventricle remains patent at this time with no evidence for obstructive hydrocephalus. Associated prominent petechial hemorrhage on SWI sequence without frank hemorrhagic transformation. Multiple additional scattered predominantly subcentimeter acute to early subacute ischemic infarcts seen elsewhere throughout the bilateral cerebral hemispheres as well as the right cerebellum. Largest of these foci within the right cerebral hemisphere measures 1 cm and is positioned within the mid right corona radiata (series 5, image 79). Largest focus within the left cerebral hemisphere involves the left periatrial white matter and measures 8 mm (series 5, image 72). Punctate 5 mm infarct within the mid right cerebellum (series 5, image 58). No made of a 6 mm ischemic infarct involving the left paramedian pons (series 5, image 65). No associated mass effect or hemorrhage about these additional infarcts. Gray-white matter differentiation otherwise maintained. Additional single chronic microhemorrhage noted within the right cerebellum. No mass lesion or midline shift. No hydrocephalus. No extra-axial fluid collection. Pituitary gland suprasellar region within normal limits. Midline  structures intact. Vascular: Major intracranial vascular flow voids are maintained. Skull and upper cervical spine: Craniocervical junction within normal limits. Upper cervical spine normal. Bone marrow signal intensity within normal limits. No scalp soft tissue abnormality. Sinuses/Orbits: Globes orbital soft tissues demonstrate no acute finding. Axial myopia noted. Mild scattered mucosal thickening within the ethmoidal air cells and maxillary sinuses. Paranasal sinuses are otherwise clear. No mastoid effusion. Inner ear structures grossly normal. Other: None. MRA HEAD FINDINGS ANTERIOR CIRCULATION: Examination mildly degraded by motion artifact. Distal cervical segments of the internal carotid arteries are patent with symmetric antegrade flow. Petrous segments patent bilaterally without flow-limiting stenosis. Scattered atheromatous irregularity throughout the cavernous/supraclinoid ICAs bilaterally. No significant stenosis on the right. There is moderate multifocal narrowing involving the cavernous/supraclinoid left ICA (up to approximately 50%). ICA termini well perfused. A1 segments patent bilaterally. Normal anterior communicating artery. Anterior cerebral arteries patent to their distal aspects without stenosis. No M1 stenosis or occlusion. Normal MCA bifurcations. Distal MCA branches well perfused and symmetric. Distal small vessel atheromatous irregularity noted. POSTERIOR CIRCULATION: Vertebral arteries patent to the vertebrobasilar junction without stenosis. Left vertebral artery slightly dominant. Right PICA patent. Left PICA not visualized, suspected to be occluded. Proximal and mid basilar artery widely patent. Moderate focal stenosis noted at the basilar tip (series 1075, image 11). Superior cerebral arteries patent bilaterally. Both of the posterior cerebral arteries primarily supplied via the basilar. PCAs demonstrate mild atheromatous irregularity but are well perfused to their distal aspects  without high-grade stenosis. No intracranial aneurysm. IMPRESSION: MRI HEAD IMPRESSION: 1. 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 Associated edema with mild mass effect on the adjacent fourth ventricular outflow tract. No associated obstructive hydrocephalus at this time. Associated prominent petechial hemorrhage without frank hemorrhagic transformation. 2. Additional scattered predominantly subcentimeter acute to early subacute  ischemic infarcts involving the bilateral cerebral hemispheres, brainstem, and right cerebellum. Given the various vascular distributions involved, a central thromboembolic source is suspected. 3. Underlying mild to moderate chronic microvascular ischemic disease. MRA HEAD IMPRESSION: 1. Nonvisualization of the left PICA, suspected to be occluded given the left PICA territory infarct. 2. Moderate atherosclerotic change involving the left carotid siphon with associated moderate multifocal narrowing (approximate up to 50%). 3. Focal moderate basilar tip stenosis. 4. Additional scattered small vessel atheromatous irregularity elsewhere throughout the intracranial circulation. No other hemodynamically significant or correctable stenosis. Electronically Signed   By: Jeannine Boga M.D.   On: 03/01/2019 00:40   Dg Chest Port 1 View  Result Date: 02/28/2019 CLINICAL DATA:  Dizziness for the past week. Hypertension. EXAM: PORTABLE CHEST 1 VIEW COMPARISON:  02/22/2012. FINDINGS: Interval mildly enlarged cardiac silhouette and mildly prominent pulmonary vasculature. Clear lungs. No pleural fluid. Unremarkable bones. IMPRESSION: Interval mild cardiomegaly and mild pulmonary vascular congestion. Electronically Signed   By: Claudie Revering M.D.   On: 02/28/2019 13:44        Scheduled Meds:  aspirin EC  81 mg Oral Daily   atorvastatin  40 mg Oral q1800   clopidogrel  75 mg Oral Daily   enoxaparin (LOVENOX) injection   40 mg Subcutaneous Q24H   hydrochlorothiazide  25 mg Oral Daily   irbesartan  300 mg Oral Daily   potassium chloride  40 mEq Oral BID   Continuous Infusions:   LOS: 1 day    Time spent:35 mins. More than 50% of that time was spent in counseling and/or coordination of care.      Shelly Coss, MD [facility] Pager [phone]  If 7PM-7AM, please contact night-coverage [email or web address] Password [email or web address] 03/01/2019, 11:19 AM

## 2019-03-01 NOTE — Progress Notes (Signed)
VASCULAR LAB PRELIMINARY  PRELIMINARY  PRELIMINARY  PRELIMINARY  Carotid duplex completed.    Preliminary report:  See CV proc for preliminary results.  Truman Aceituno, RVT 03/01/2019, 12:17 PM

## 2019-03-01 NOTE — Progress Notes (Signed)
STROKE TEAM PROGRESS NOTE   HISTORY OF PRESENT ILLNESS (per Dr Cheral Marker) Gabriel Hoover is an 58 y.o. male with HTN and DM2 who presented to the [facility] ED on Saturday afternoon with ongoing dizziness x 1 week in conjunction with HTN. He had been admitted here 1 week ago on 7/7 for acute monocular vision loss lasting for 10 minutes, then returning over about one hour. He was diagnosed with amaurosis fugax. Severe HTN with SBP in the 220s on initial presentation was noted. CTA of head and neck, MRI brain and echocardiogram were unremarkable. His discharge medications included atorvastatin, ASA and Plavix.    Of note, the patient has a severe needle phobia limiting our ability to draw blood. He required sedation for a blood draw early this AM, which somewhat compromised his mentation on subsequent exam.    SUBJECTIVE (INTERVAL HISTORY) No one is at the bedside.  He still complains of mild persistent dizziness.  Blood pressure is better controlled.  MRI shows large left cerebellar infarct with mild cytotoxic edema and mass-effect on the fourth ventricle.  No hydrocephalus.  Additional scattered small infarcts in bilateral cerebral hemisphere, brainstem and right cerebellum likely from proximal cardiac source.  MRA shows occlusion of left PICA and moderate atherosclerotic changes involving left carotid siphon moderate basilar tip stenosis.   OBJECTIVE Vitals:   03/01/19 0312 03/01/19 0400 03/01/19 0412 03/01/19 0805  BP: (!) 153/78   (!) 163/98  Pulse:    82  Resp:      Temp:   99 F (37.2 C) 98.2 F (36.8 C)  TempSrc:    Oral  SpO2: 93% 96%  98%  Weight:  81.7 kg    Height:        CBC:  Recent Labs  Lab 02/28/19 1930 02/28/19 1944 03/01/19 0536  WBC 11.4*  --  11.1*  NEUTROABS 9.0*  --   --   HGB 13.8 12.6* 12.9*  HCT 37.3* 37.0* 35.4*  MCV 80.6  --  80.5  PLT 244  --  409    Basic Metabolic Panel:  Recent Labs  Lab 02/28/19 1930 02/28/19 1944 03/01/19 0536  NA 134* 136 135  K  3.0* 3.1* 3.0*  CL 96* 97* 100  CO2 25  --  25  GLUCOSE 249* 233* 236*  BUN 22* 22* 19  CREATININE 1.24 1.10 1.11  CALCIUM 8.8*  --  8.4*    Lipid Panel:     Component Value Date/Time   CHOL 114 03/01/2019 0536   TRIG 80 03/01/2019 0536   HDL 39 (L) 03/01/2019 0536   CHOLHDL 2.9 03/01/2019 0536   VLDL 16 03/01/2019 0536   LDLCALC 59 03/01/2019 0536   LDLCALC 148 (H) 07/02/2018 1030   HgbA1c:  Lab Results  Component Value Date   HGBA1C 10.1 (H) 03/01/2019   Urine Drug Screen:     Component Value Date/Time   LABOPIA NONE DETECTED 02/28/2019 1831   COCAINSCRNUR NONE DETECTED 02/28/2019 1831   LABBENZ NONE DETECTED 02/28/2019 1831   AMPHETMU NONE DETECTED 02/28/2019 1831   THCU NONE DETECTED 02/28/2019 1831   LABBARB NONE DETECTED 02/28/2019 1831    Alcohol Level     Component Value Date/Time   ETH <10 02/28/2019 1930    IMAGING  Ct Head Wo Contrast 02/28/2019 IMPRESSION:  1. Acute/subacute nonhemorrhagic stroke involving the LEFT INFERIOR cerebellum.  2. Otherwise normal examination.   Mr Angio Head Wo Contrast 03/01/2019  MRI HEAD  IMPRESSION:  1. Moderate-size confluent early  subacute left PICA territory infarct involving the inferior left cerebellum. Associated edema with mild mass effect on the adjacent fourth ventricular outflow tract. No associated obstructive hydrocephalus at this time. Associated prominent petechial hemorrhage without frank hemorrhagic transformation.  2. Additional scattered predominantly subcentimeter acute to early subacute ischemic infarcts involving the bilateral cerebral hemispheres, brainstem, and right cerebellum. Given the various vascular distributions involved, a central thromboembolic source is suspected.  3. Underlying mild to moderate chronic microvascular ischemic disease.   MRA HEAD  IMPRESSION:  1. Nonvisualization of the left PICA, suspected to be occluded given the left PICA territory infarct.  2. Moderate  atherosclerotic change involving the left carotid siphon with associated moderate multifocal narrowing (approximate up to 50%).  3. Focal moderate basilar tip stenosis.  4. Additional scattered small vessel atheromatous irregularity elsewhere throughout the intracranial circulation. No other hemodynamically significant or correctable stenosis.    Dg Chest Port 1 View 02/28/2019 IMPRESSION:  Interval mild cardiomegaly and mild pulmonary vascular congestion.    CT Head Wo Contrast - pending    Transthoracic Echocardiogram  02/28/2019 IMPRESSIONS  1. The left ventricle has hyperdynamic systolic function, with an ejection fraction of >65%. The cavity size was normal. There is mildly increased left ventricular wall thickness. Left ventricular diastolic Doppler parameters are consistent with  impaired relaxation. Elevated mean left atrial pressure.  2. The right ventricle has normal systolic function. The cavity was normal.  3. The mitral valve is grossly normal.  4. There is mild dilatation of the aortic root measuring 38 mm.  5. The tricuspid valve is grossly normal.  6. The aortic valve is tricuspid. No stenosis of the aortic valve.  7. Vigorous LV systolic function; mild LVH; mild diastolic dysfunction; mildly dilated aortic root.   Bilateral Carotid Dopplers  00/00/2020 Pending   EKG - SR rate 95 BPM. Possible old Mi. (See cardiology reading for complete details)    PHYSICAL EXAM Blood pressure (!) 163/98, pulse 82, temperature 98.2 F (36.8 C), temperature source Oral, resp. rate 18, height 5\' 9"  (1.753 m), weight 81.7 kg, SpO2 98 %. Pleasant middle-aged Caucasian male not in distress. . Afebrile. Head is nontraumatic. Neck is supple without bruit.    Cardiac exam no murmur or gallop. Lungs are clear to auscultation. Distal pulses are well felt. Neurological Exam ;  Awake  Alert oriented x 3. Normal speech and language.eye movements full with endgaze  Nystagmus on right  lateral gaze.fundi were not visualized. Vision acuity and fields appear normal. Hearing is normal. Palatal movements are normal. Face symmetric. Tongue midline. Normal strength, tone, reflexes and coordination. Normal sensation. Gait deferred.      ASSESSMENT/PLAN Gabriel Hoover is a 58 y.o. male with history of Htn, Hld, DM, anxiety and admission 1 week ago for acute monocular vision loss lasting for 10 minutes diagnosed as amaurosis fugax presenting with a one wk hx of dizziness and elevated BP (SBP in the 220s)  . He did not receive IV t-PA due to late presentation (>4.5 hours from time of onset)  Stroke: infarct of inferior left cerebellum - likely embolic - unknown source  Resultant mild dizzines  CT head - pending  MRI head - Moderate-size confluent early subacute left PICA territory infarct involving the inferior left cerebellum. Associated edema with mild mass effect on the adjacent fourth ventricular outflow tract.  Additional scattered predominantly subcentimeter acute to early subacute ischemic infarcts involving the bilateral cerebral hemispheres, brainstem, and right cerebellum.  MRA head -  Nonvisualization  of the left PICA, suspected to be occluded given the left PICA territory infarct.   CTA H&N  - not ordered   Carotid Doppler - pending  2D Echo  - EF >65%. No cardiac source of emboli identified.   Sars Corona Virus 2  - negative  LDL - 148  HgbA1c - 10.1  UDS - negative  VTE prophylaxis - lovenox  Diet  - Heart healthy with thin liquids.  aspirin 81 mg daily and clopidogrel 75 mg daily prior to admission, now on aspirin 81 mg daily and clopidogrel 75 mg daily  Patient counseled to be compliant with his antithrombotic medications  Ongoing aggressive stroke risk factor management  Therapy recommendations:  Possible CIR admission. Rehab MD consult requested  Disposition:  Pending  Hypertension  Stable . Permissive hypertension (OK if < 220/120) but  gradually normalize in 5-7 days . Long-term BP goal normotensive  Hyperlipidemia  Lipid lowering medication PTA:  Lipitor 40 mg daily  LDL 148, goal < 70  Current lipid lowering medication: Lipitor 40 mg daily (increase to 80 mg daily)  Continue statin at discharge  Diabetes  HgbA1c 10.1,  goal < 7.0  Uncontrolled  Other Stroke Risk Factors  Hx stroke/TIA  Other Active Problems  Severe needle phobia (requires sedation) limiting ability to draw blood.   Hypokalemia - 3.0 - supplemented  Mild leukocytosis  Mild anemia   Hospital day # 1  I have personally obtained history,examined this patient, reviewed notes, independently viewed imaging studies, participated in medical decision making and plan of care.ROS completed by me personally and pertinent positives fully documented  I have made any additions or clarifications directly to the above note.   He had a recent admission for amaurosis fugax and now presents with by cerebral embolic infarcts likely strong suspicion for cardiac source.  Recommend check TEE and loop recorder and continue cardiac monitoring.  Aspirin and Plavix for now. PT/OT consults. Mobilize out of bed as tolerated. Long discussion with the patient and answered questions.  Discussed with Dr. Tamsen Meek.  Greater than 50% time during this 35-minute visit was spent on counseling and coordination of care about his embolic strokes and discussion about evaluation and treatment plan and answering questions Antony Contras, MD Medical Director [place] Pager: [phone] 03/01/2019 11:47 AM   To contact Stroke Continuity provider, please refer to [email or web address] After hours, contact General Neurology

## 2019-03-01 NOTE — H&P (View-Only) (Signed)
STROKE TEAM PROGRESS NOTE   HISTORY OF PRESENT ILLNESS (per Dr Cheral Marker) Gabriel Hoover is an 58 y.o. male with HTN and DM2 who presented to the [facility] ED on Saturday afternoon with ongoing dizziness x 1 week in conjunction with HTN. He had been admitted here 1 week ago on 7/7 for acute monocular vision loss lasting for 10 minutes, then returning over about one hour. He was diagnosed with amaurosis fugax. Severe HTN with SBP in the 220s on initial presentation was noted. CTA of head and neck, MRI brain and echocardiogram were unremarkable. His discharge medications included atorvastatin, ASA and Plavix.    Of note, the patient has a severe needle phobia limiting our ability to draw blood. He required sedation for a blood draw early this AM, which somewhat compromised his mentation on subsequent exam.    SUBJECTIVE (INTERVAL HISTORY) No one is at the bedside.  He still complains of mild persistent dizziness.  Blood pressure is better controlled.  MRI shows large left cerebellar infarct with mild cytotoxic edema and mass-effect on the fourth ventricle.  No hydrocephalus.  Additional scattered small infarcts in bilateral cerebral hemisphere, brainstem and right cerebellum likely from proximal cardiac source.  MRA shows occlusion of left PICA and moderate atherosclerotic changes involving left carotid siphon moderate basilar tip stenosis.   OBJECTIVE Vitals:   03/01/19 0312 03/01/19 0400 03/01/19 0412 03/01/19 0805  BP: (!) 153/78   (!) 163/98  Pulse:    82  Resp:      Temp:   99 F (37.2 C) 98.2 F (36.8 C)  TempSrc:    Oral  SpO2: 93% 96%  98%  Weight:  81.7 kg    Height:        CBC:  Recent Labs  Lab 02/28/19 1930 02/28/19 1944 03/01/19 0536  WBC 11.4*  --  11.1*  NEUTROABS 9.0*  --   --   HGB 13.8 12.6* 12.9*  HCT 37.3* 37.0* 35.4*  MCV 80.6  --  80.5  PLT 244  --  161    Basic Metabolic Panel:  Recent Labs  Lab 02/28/19 1930 02/28/19 1944 03/01/19 0536  NA 134* 136 135  K  3.0* 3.1* 3.0*  CL 96* 97* 100  CO2 25  --  25  GLUCOSE 249* 233* 236*  BUN 22* 22* 19  CREATININE 1.24 1.10 1.11  CALCIUM 8.8*  --  8.4*    Lipid Panel:     Component Value Date/Time   CHOL 114 03/01/2019 0536   TRIG 80 03/01/2019 0536   HDL 39 (L) 03/01/2019 0536   CHOLHDL 2.9 03/01/2019 0536   VLDL 16 03/01/2019 0536   LDLCALC 59 03/01/2019 0536   LDLCALC 148 (H) 07/02/2018 1030   HgbA1c:  Lab Results  Component Value Date   HGBA1C 10.1 (H) 03/01/2019   Urine Drug Screen:     Component Value Date/Time   LABOPIA NONE DETECTED 02/28/2019 1831   COCAINSCRNUR NONE DETECTED 02/28/2019 1831   LABBENZ NONE DETECTED 02/28/2019 1831   AMPHETMU NONE DETECTED 02/28/2019 1831   THCU NONE DETECTED 02/28/2019 1831   LABBARB NONE DETECTED 02/28/2019 1831    Alcohol Level     Component Value Date/Time   ETH <10 02/28/2019 1930    IMAGING  Ct Head Wo Contrast 02/28/2019 IMPRESSION:  1. Acute/subacute nonhemorrhagic stroke involving the LEFT INFERIOR cerebellum.  2. Otherwise normal examination.   Mr Angio Head Wo Contrast 03/01/2019  MRI HEAD  IMPRESSION:  1. Moderate-size confluent early  subacute left PICA territory infarct involving the inferior left cerebellum. Associated edema with mild mass effect on the adjacent fourth ventricular outflow tract. No associated obstructive hydrocephalus at this time. Associated prominent petechial hemorrhage without frank hemorrhagic transformation.  2. Additional scattered predominantly subcentimeter acute to early subacute ischemic infarcts involving the bilateral cerebral hemispheres, brainstem, and right cerebellum. Given the various vascular distributions involved, a central thromboembolic source is suspected.  3. Underlying mild to moderate chronic microvascular ischemic disease.   MRA HEAD  IMPRESSION:  1. Nonvisualization of the left PICA, suspected to be occluded given the left PICA territory infarct.  2. Moderate  atherosclerotic change involving the left carotid siphon with associated moderate multifocal narrowing (approximate up to 50%).  3. Focal moderate basilar tip stenosis.  4. Additional scattered small vessel atheromatous irregularity elsewhere throughout the intracranial circulation. No other hemodynamically significant or correctable stenosis.    Dg Chest Port 1 View 02/28/2019 IMPRESSION:  Interval mild cardiomegaly and mild pulmonary vascular congestion.    CT Head Wo Contrast - pending    Transthoracic Echocardiogram  02/28/2019 IMPRESSIONS  1. The left ventricle has hyperdynamic systolic function, with an ejection fraction of >65%. The cavity size was normal. There is mildly increased left ventricular wall thickness. Left ventricular diastolic Doppler parameters are consistent with  impaired relaxation. Elevated mean left atrial pressure.  2. The right ventricle has normal systolic function. The cavity was normal.  3. The mitral valve is grossly normal.  4. There is mild dilatation of the aortic root measuring 38 mm.  5. The tricuspid valve is grossly normal.  6. The aortic valve is tricuspid. No stenosis of the aortic valve.  7. Vigorous LV systolic function; mild LVH; mild diastolic dysfunction; mildly dilated aortic root.   Bilateral Carotid Dopplers  00/00/2020 Pending   EKG - SR rate 95 BPM. Possible old Mi. (See cardiology reading for complete details)    PHYSICAL EXAM Blood pressure (!) 163/98, pulse 82, temperature 98.2 F (36.8 C), temperature source Oral, resp. rate 18, height 5\' 9"  (1.753 m), weight 81.7 kg, SpO2 98 %. Pleasant middle-aged Caucasian male not in distress. . Afebrile. Head is nontraumatic. Neck is supple without bruit.    Cardiac exam no murmur or gallop. Lungs are clear to auscultation. Distal pulses are well felt. Neurological Exam ;  Awake  Alert oriented x 3. Normal speech and language.eye movements full with endgaze  Nystagmus on right  lateral gaze.fundi were not visualized. Vision acuity and fields appear normal. Hearing is normal. Palatal movements are normal. Face symmetric. Tongue midline. Normal strength, tone, reflexes and coordination. Normal sensation. Gait deferred.      ASSESSMENT/PLAN [name] is a 58 y.o. male with history of Htn, Hld, DM, anxiety and admission 1 week ago for acute monocular vision loss lasting for 10 minutes diagnosed as amaurosis fugax presenting with a one wk hx of dizziness and elevated BP (SBP in the 220s)  . He did not receive IV t-PA due to late presentation (>4.5 hours from time of onset)  Stroke: infarct of inferior left cerebellum - likely embolic - unknown source  Resultant mild dizzines  CT head - pending  MRI head - Moderate-size confluent early subacute left PICA territory infarct involving the inferior left cerebellum. Associated edema with mild mass effect on the adjacent fourth ventricular outflow tract.  Additional scattered predominantly subcentimeter acute to early subacute ischemic infarcts involving the bilateral cerebral hemispheres, brainstem, and right cerebellum.  MRA head -  Nonvisualization  of the left PICA, suspected to be occluded given the left PICA territory infarct.   CTA H&N  - not ordered   Carotid Doppler - pending  2D Echo  - EF >65%. No cardiac source of emboli identified.   Sars Corona Virus 2  - negative  LDL - 148  HgbA1c - 10.1  UDS - negative  VTE prophylaxis - lovenox  Diet  - Heart healthy with thin liquids.  aspirin 81 mg daily and clopidogrel 75 mg daily prior to admission, now on aspirin 81 mg daily and clopidogrel 75 mg daily  Patient counseled to be compliant with his antithrombotic medications  Ongoing aggressive stroke risk factor management  Therapy recommendations:  Possible CIR admission. Rehab MD consult requested  Disposition:  Pending  Hypertension  Stable . Permissive hypertension (OK if < 220/120) but  gradually normalize in 5-7 days . Long-term BP goal normotensive  Hyperlipidemia  Lipid lowering medication PTA:  Lipitor 40 mg daily  LDL 148, goal < 70  Current lipid lowering medication: Lipitor 40 mg daily (increase to 80 mg daily)  Continue statin at discharge  Diabetes  HgbA1c 10.1,  goal < 7.0  Uncontrolled  Other Stroke Risk Factors  Hx stroke/TIA  Other Active Problems  Severe needle phobia (requires sedation) limiting ability to draw blood.   Hypokalemia - 3.0 - supplemented  Mild leukocytosis  Mild anemia   Hospital day # 1  I have personally obtained history,examined this patient, reviewed notes, independently viewed imaging studies, participated in medical decision making and plan of care.ROS completed by me personally and pertinent positives fully documented  I have made any additions or clarifications directly to the above note.   He had a recent admission for amaurosis fugax and now presents with by cerebral embolic infarcts likely strong suspicion for cardiac source.  Recommend check TEE and loop recorder and continue cardiac monitoring.  Aspirin and Plavix for now. PT/OT consults. Mobilize out of bed as tolerated. Long discussion with the patient and answered questions.  Discussed with Dr. Tamsen Meek.  Greater than 50% time during this 35-minute visit was spent on counseling and coordination of care about his embolic strokes and discussion about evaluation and treatment plan and answering questions Antony Contras, MD Medical Director [place] Pager: [phone] 03/01/2019 11:47 AM   To contact Stroke Continuity provider, please refer to [email or web address] After hours, contact General Neurology

## 2019-03-02 ENCOUNTER — Inpatient Hospital Stay (HOSPITAL_COMMUNITY): Payer: BC Managed Care – PPO

## 2019-03-02 ENCOUNTER — Inpatient Hospital Stay (HOSPITAL_COMMUNITY): Payer: BC Managed Care – PPO | Admitting: Anesthesiology

## 2019-03-02 ENCOUNTER — Ambulatory Visit: Payer: BC Managed Care – PPO | Admitting: Internal Medicine

## 2019-03-02 ENCOUNTER — Encounter (HOSPITAL_COMMUNITY)
Admission: EM | Disposition: A | Discharge status: Home / Self Care | Payer: Self-pay | Source: Home / Self Care | Attending: Internal Medicine

## 2019-03-02 ENCOUNTER — Encounter (HOSPITAL_COMMUNITY): Payer: Self-pay

## 2019-03-02 DIAGNOSIS — I639 Cerebral infarction, unspecified: Secondary | ICD-10-CM

## 2019-03-02 DIAGNOSIS — I6389 Other cerebral infarction: Secondary | ICD-10-CM

## 2019-03-02 HISTORY — PX: TEE WITHOUT CARDIOVERSION: SHX5443

## 2019-03-02 HISTORY — PX: BUBBLE STUDY: SHX6837

## 2019-03-02 LAB — BASIC METABOLIC PANEL
Anion gap: 9 (ref 5–15)
BUN: 19 mg/dL (ref 6–20)
CO2: 26 mmol/L (ref 22–32)
Calcium: 8.9 mg/dL (ref 8.9–10.3)
Chloride: 102 mmol/L (ref 98–111)
Creatinine, Ser: 1.2 mg/dL (ref 0.61–1.24)
GFR calc Af Amer: >60 mL/min (ref >60)
GFR calc non Af Amer: >60 mL/min (ref >60)
Glucose, Bld: 197 mg/dL — ABNORMAL HIGH (ref 70–99)
Potassium: 3.4 mmol/L — ABNORMAL LOW (ref 3.5–5.1)
Sodium: 137 mmol/L (ref 135–145)

## 2019-03-02 LAB — GLUCOSE, CAPILLARY
Glucose-Capillary: 209 mg/dL — ABNORMAL HIGH (ref 70–99)
Glucose-Capillary: 182 mg/dL — ABNORMAL HIGH (ref 70–99)
Glucose-Capillary: 185 mg/dL — ABNORMAL HIGH (ref 70–99)
Glucose-Capillary: 285 mg/dL — ABNORMAL HIGH (ref 70–99)
Glucose-Capillary: 191 mg/dL — ABNORMAL HIGH (ref 70–99)

## 2019-03-02 SURGERY — ECHOCARDIOGRAM, TRANSESOPHAGEAL
Anesthesia: Monitor Anesthesia Care

## 2019-03-02 MED ORDER — PROPOFOL 500 MG/50ML IV EMUL
INTRAVENOUS | Status: DC | PRN
  Administered 2019-03-02: 50 ug/kg/min via INTRAVENOUS

## 2019-03-02 MED ORDER — HYDRALAZINE HCL 20 MG/ML IJ SOLN
5.0000 mg | Freq: Once | INTRAMUSCULAR | Status: AC
  Administered 2019-03-02: 12:00:00 5 mg via INTRAVENOUS

## 2019-03-02 MED ORDER — SODIUM CHLORIDE 0.9 % IV SOLN: INTRAVENOUS | Status: DC

## 2019-03-02 MED ORDER — HYDRALAZINE HCL 20 MG/ML IJ SOLN
INTRAMUSCULAR | Status: AC
  Filled 2019-03-02: qty 1

## 2019-03-02 MED ORDER — DEXMEDETOMIDINE HCL 200 MCG/2ML IV SOLN
INTRAVENOUS | Status: DC | PRN
  Administered 2019-03-02: 8 ug via INTRAVENOUS
  Administered 2019-03-02: 4 ug via INTRAVENOUS
  Administered 2019-03-02: 8 ug via INTRAVENOUS
  Administered 2019-03-02 (×2): 4 ug via INTRAVENOUS

## 2019-03-02 MED ORDER — LIDOCAINE HCL (CARDIAC) PF 100 MG/5ML IV SOSY
PREFILLED_SYRINGE | INTRAVENOUS | Status: DC | PRN
  Administered 2019-03-02: 60 mg via INTRAVENOUS

## 2019-03-02 MED ORDER — LIVING WELL WITH DIABETES BOOK
Freq: Once | Status: AC
  Administered 2019-03-02: 18:00:00
  Filled 2019-03-02: qty 1

## 2019-03-02 MED ORDER — POTASSIUM CHLORIDE CRYS ER 20 MEQ PO TBCR
40.0000 meq | EXTENDED_RELEASE_TABLET | Freq: Once | ORAL | Status: AC
  Administered 2019-03-02: 40 meq via ORAL
  Filled 2019-03-02: qty 2

## 2019-03-02 MED ORDER — HYDRALAZINE HCL 20 MG/ML IJ SOLN
5.0000 mg | Freq: Once | INTRAMUSCULAR | Status: AC
  Administered 2019-03-02: 11:00:00 5 mg via INTRAVENOUS

## 2019-03-02 MED ORDER — LACTATED RINGERS IV SOLN
INTRAVENOUS | Status: DC
  Administered 2019-03-02: 11:00:00 via INTRAVENOUS

## 2019-03-02 MED ORDER — PROPOFOL 10 MG/ML IV BOLUS
INTRAVENOUS | Status: DC | PRN
  Administered 2019-03-02 (×3): 20 mg via INTRAVENOUS
  Administered 2019-03-02: 11 mg via INTRAVENOUS

## 2019-03-02 NOTE — Progress Notes (Addendum)
PROGRESS NOTE    Gabriel Hoover  JHE:[id number] DOB: 06-May-1961 DOA: 02/28/2019 PCP: Unk Pinto, MD   Brief Narrative:    Patient is a 58 year old male with history of hypertension, hyperlipidemia, anxiety disorder, migraine headaches, prediabetes who presented to the emergency department with complaints of dizziness for last 3 days.  He was noted to be hypertensive on presentation.  CT head showed acute to subacute nonhemorrhagic stroke involving the left inferior cerebellum.  MRI of the brain showed left cerebellar stroke.  Neurology consulted.  Plan for TEE and loop recorder placement today.  Assessment & Plan:   Principal Problem:   Acute cerebrovascular accident (CVA) ([place]) Active Problems:   Hyperlipidemia   Hypertension   T2_NIDDM ([place])   Poor compliance with medication   Acute cerebral infarct: MRI showed:Moderate-size confluent early subacute left PICA territory infarct involving the inferior left cerebellum. Associated edema with mild mass effect on the adjacent fourth ventricular outflow tract. No associated obstructive hydrocephalus at this time. Associated prominent petechial hemorrhage without frank hemorrhagic transformation.Additional scattered predominantly subcentimeter acute to early subacute ischemic infarcts involving the bilateral cerebral hemispheres, brainstem, and right cerebellum.  Stroke work-up initiated.  He was outside the window for TPA administration.  Neurology following.  Started on aspirin and plavix.  PT/OT/speech evaluation.  Echocardiogram done which showed EF of more than 65%, impaired relaxation, no embolic source.  Carotid Doppler pending. EKG showed normal sinus rhythm. Patient needs TEE.  Most likely source is embolic. Hemoglobin A1c of 10.2.  LDL of 59. Started on Lipitor. PT/OT recommended CIR on discharge. Plan for TEE and loop recorder placement today.   Hypertensive urgency: Blood pressure in the range of 200/100s on  presentation.  Allow permissive hypertension.  Continue PRN meds.  Gradually normalize blood pressure in 5 to 7 days.  Hyperglycemia: Hemoglobin C of 10.1.  Continue sliding scale insulin for now.  Most likely needs insulin on discharge.  Not on any medicines at home.  Requested diabetic coordinator consultation.  Hyperlipidemia: Continue statin  Hypokalemia: Supplemented with potassium.         DVT prophylaxis: Lovenox Code Status: Full Family Communication: None Disposition Plan: CIR when full work-up done   Consultants: Neurology  Procedures: MRI  Antimicrobials:  Anti-infectives (From admission, onward)   None      Subjective: Patient seen and examined the bedside this morning.  Remains comfortable.  Hemodynamically stable.  TEE and loop recorder placement today.  No new questions or concerns.  No new complaints.Dizziness has resolved.  Objective: Vitals:   03/02/19 1203 03/02/19 1231 03/02/19 1246 03/02/19 1300  BP: (!) 204/100 130/74 (!) 151/80 (!) 172/94  Pulse:  78 77 72  Resp:  18 18 18   Temp:  98.4 F (36.9 C)  98.7 F (37.1 C)  TempSrc:  Oral  Axillary  SpO2:  99% 100% 97%  Weight:      Height:        Intake/Output Summary (Last 24 hours) at 03/02/2019 1406 Last data filed at 03/02/2019 1225 Gross per 24 hour  Intake 200 ml  Output --  Net 200 ml   Filed Weights   02/28/19 1308 03/01/19 0400  Weight: 83.9 kg 81.7 kg    Examination:  General exam: Appears calm and comfortable ,Not in distress,average built HEENT:PERRL,Oral mucosa moist, Ear/Nose normal on gross exam Respiratory system: Bilateral equal air entry, normal vesicular breath sounds, no wheezes or crackles  Cardiovascular system: S1 & S2 heard, RRR. No JVD, murmurs, rubs, gallops or clicks.  Gastrointestinal system: Abdomen is nondistended, soft and nontender. No organomegaly or masses felt. Normal bowel sounds heard. Central nervous system: Alert and oriented. No focal neurological  deficits. Extremities: No edema, no clubbing ,no cyanosis, distal peripheral pulses palpable. Skin: No rashes, lesions or ulcers,no icterus ,no pallor   Data Reviewed: I have personally reviewed following labs and imaging studies  CBC: Recent Labs  Lab 02/28/19 1355 02/28/19 1930 02/28/19 1944 03/01/19 0536  WBC  --  11.4*  --  11.1*  NEUTROABS  --  9.0*  --   --   HGB 12.9* 13.8 12.6* 12.9*  HCT 38.0* 37.3* 37.0* 35.4*  MCV  --  80.6  --  80.5  PLT  --  244  --  161   Basic Metabolic Panel: Recent Labs  Lab 02/28/19 1355 02/28/19 1930 02/28/19 1944 03/01/19 0536 03/02/19 0525  NA 137 134* 136 135 137  K 3.4* 3.0* 3.1* 3.0* 3.4*  CL  --  96* 97* 100 102  CO2  --  25  --  25 26  GLUCOSE  --  249* 233* 236* 197*  BUN  --  22* 22* 19 19  CREATININE  --  1.24 1.10 1.11 1.20  CALCIUM  --  8.8*  --  8.4* 8.9   GFR: Estimated Creatinine Clearance: 67.9 mL/min (by C-G formula based on SCr of 1.2 mg/dL). Liver Function Tests: Recent Labs  Lab 02/28/19 1930 03/01/19 0536  AST 14* 15  ALT 18 17  ALKPHOS 64 64  BILITOT 1.3* 1.1  PROT 6.4* 5.8*  ALBUMIN 3.7 3.3*   No results for input(s): LIPASE, AMYLASE in the last 168 hours. No results for input(s): AMMONIA in the last 168 hours. Coagulation Profile: Recent Labs  Lab 02/28/19 1930  INR 1.1   Cardiac Enzymes: No results for input(s): CKTOTAL, CKMB, CKMBINDEX, TROPONINI in the last 168 hours. BNP (last 3 results) No results for input(s): PROBNP in the last 8760 hours. HbA1C: Recent Labs    03/01/19 0536  HGBA1C 10.1*   CBG: Recent Labs  Lab 03/01/19 1637 03/01/19 2142 03/02/19 0628 03/02/19 1123 03/02/19 1318  GLUCAP 215* 210* 209* 182* 185*   Lipid Profile: Recent Labs    03/01/19 0536  CHOL 114  HDL 39*  LDLCALC 59  TRIG 80  CHOLHDL 2.9   Thyroid Function Tests: No results for input(s): TSH, T4TOTAL, FREET4, T3FREE, THYROIDAB in the last 72 hours. Anemia Panel: No results for input(s):  VITAMINB12, FOLATE, FERRITIN, TIBC, IRON, RETICCTPCT in the last 72 hours. Sepsis Labs: No results for input(s): PROCALCITON, LATICACIDVEN in the last 168 hours.  Recent Results (from the past 240 hour(s))  SARS Coronavirus 2 (CEPHEID - Performed in [facility] lab), Hosp Order     Status: None   Collection Time: 02/28/19  7:25 PM   Specimen: Nasopharyngeal Swab  Result Value Ref Range Status   SARS Coronavirus 2 NEGATIVE NEGATIVE Final    Comment: (NOTE) If result is NEGATIVE SARS-CoV-2 target nucleic acids are NOT DETECTED. The SARS-CoV-2 RNA is generally detectable in upper and lower  respiratory specimens during the acute phase of infection. The lowest  concentration of SARS-CoV-2 viral copies this assay can detect is 250  copies / mL. A negative result does not preclude SARS-CoV-2 infection  and should not be used as the sole basis for treatment or other  patient management decisions.  A negative result may occur with  improper specimen collection / handling, submission of specimen other  than nasopharyngeal swab, presence of viral mutation(s) within the  areas targeted by this assay, and inadequate number of viral copies  (<250 copies / mL). A negative result must be combined with clinical  observations, patient history, and epidemiological information. If result is POSITIVE SARS-CoV-2 target nucleic acids are DETECTED. The SARS-CoV-2 RNA is generally detectable in upper and lower  respiratory specimens dur ing the acute phase of infection.  Positive  results are indicative of active infection with SARS-CoV-2.  Clinical  correlation with patient history and other diagnostic information is  necessary to determine patient infection status.  Positive results do  not rule out bacterial infection or co-infection with other viruses. If result is PRESUMPTIVE POSTIVE SARS-CoV-2 nucleic acids MAY BE PRESENT.   A presumptive positive result was obtained on the submitted  specimen  and confirmed on repeat testing.  While 2019 novel coronavirus  (SARS-CoV-2) nucleic acids may be present in the submitted sample  additional confirmatory testing may be necessary for epidemiological  and / or clinical management purposes  to differentiate between  SARS-CoV-2 and other Sarbecovirus currently known to infect humans.  If clinically indicated additional testing with an alternate test  methodology [phone]) is advised. The SARS-CoV-2 RNA is generally  detectable in upper and lower respiratory sp ecimens during the acute  phase of infection. The expected result is Negative. Fact Sheet for Patients:  StrictlyIdeas.no Fact Sheet for Healthcare Providers: BankingDealers.co.za This test is not yet approved or cleared by the [place] FDA and has been authorized for detection and/or diagnosis of SARS-CoV-2 by FDA under an Emergency Use Authorization (EUA).  This EUA will remain in effect (meaning this test can be used) for the duration of the COVID-19 declaration under Section 564(b)(1) of the Act, 21 U.S.C. section 360bbb-3(b)(1), unless the authorization is terminated or revoked sooner. Performed at [facility], [place] [address], [place], [place] [address]          Radiology Studies: Ct Head Wo Contrast  Result Date: 03/01/2019 CLINICAL DATA:  Followup widespread acute infarctions EXAM: CT HEAD WITHOUT CONTRAST TECHNIQUE: Contiguous axial images were obtained from the base of the skull through the vertex without intravenous contrast. COMPARISON:  CT and MRI studies yesterday. FINDINGS: Brain: Confluent acute infarction within the inferior cerebellum on the left shows low-density and swelling without hemorrhage. Mild mass-effect upon the fourth ventricle but no ventricular obstruction. Low-density in the right frontal white matter corresponds to a acute white matter infarction. Few other scattered white matter  low densities as well. No sign of new infarction, hydrocephalus or extra-axial collection. Vascular: There is atherosclerotic calcification of the major vessels at the base of the brain. Skull: Negative Sinuses/Orbits: Clear/normal Other: None IMPRESSION: Low-density and swelling within the inferior cerebellar infarction on the left. No hemorrhagic transformation. Mass-effect upon the fourth ventricle but no ventricular obstruction. Other small acute white matter infarctions evident within the cerebral hemispheric white matter as better shown by MRI. No newly seen infarction. Electronically Signed   By: Nelson Chimes M.D.   On: 03/01/2019 12:59   Ct Head Wo Contrast  Result Date: 02/28/2019 CLINICAL DATA:  One-week history of positional vertigo. EXAM: CT HEAD WITHOUT CONTRAST TECHNIQUE: Contiguous axial images were obtained from the base of the skull through the vertex without intravenous contrast. COMPARISON:  MRI brain 02/17/2019. CTA head and neck including CT brain 02/17/2019. FINDINGS: Brain: Geographic low attenuation involving the LEFT INFERIOR cerebellum, a new finding since the prior examinations 11 days ago. Minimal  mass effect. No associated hemorrhage. Ventricular system normal in size and appearance for age. No extra-axial fluid collections. No focal brain parenchymal abnormalities elsewhere. Vascular: No visible atherosclerosis. No hyperdense vessels. Skull: No skull fracture or other focal osseous abnormality involving the skull. Sinuses/Orbits: Visualized paranasal sinuses, bilateral mastoid air cells and bilateral middle ear cavities well-aerated. Benign senile calcification involving the RIGHT eye. Visualized orbits and globes otherwise normal in appearance. Other: None. IMPRESSION: 1. Acute/subacute nonhemorrhagic stroke involving the LEFT INFERIOR cerebellum. 2. Otherwise normal examination. Electronically Signed   By: Evangeline Dakin M.D.   On: 02/28/2019 18:22   Mr Angio Head Wo  Contrast  Result Date: 03/01/2019 CLINICAL DATA:  Initial evaluation for EXAM: MRI HEAD WITHOUT CONTRAST MRA HEAD WITHOUT CONTRAST TECHNIQUE: Multiplanar, multiecho pulse sequences of the brain and surrounding structures were obtained without intravenous contrast. Angiographic images of the head were obtained using MRA technique without contrast. COMPARISON:  Comparison made with prior CT from earlier the same day as well as previous MRI from 02/17/2019. FINDINGS: MRI HEAD FINDINGS Brain: Generalized age-related cerebral atrophy with mild to moderate chronic microvascular ischemic disease. Confluent restricted diffusion involving the inferior left cerebellum compatible with left PICA territory infarct, early subacute in appearance. Associated mild localized edema, with mild mass effect on the adjacent fourth ventricular outflow tract. Fourth ventricle remains patent at this time with no evidence for obstructive hydrocephalus. Associated prominent petechial hemorrhage on SWI sequence without frank hemorrhagic transformation. Multiple additional scattered predominantly subcentimeter acute to early subacute ischemic infarcts seen elsewhere throughout the bilateral cerebral hemispheres as well as the right cerebellum. Largest of these foci within the right cerebral hemisphere measures 1 cm and is positioned within the mid right corona radiata (series 5, image 79). Largest focus within the left cerebral hemisphere involves the left periatrial white matter and measures 8 mm (series 5, image 72). Punctate 5 mm infarct within the mid right cerebellum (series 5, image 58). No made of a 6 mm ischemic infarct involving the left paramedian pons (series 5, image 65). No associated mass effect or hemorrhage about these additional infarcts. Gray-white matter differentiation otherwise maintained. Additional single chronic microhemorrhage noted within the right cerebellum. No mass lesion or midline shift. No hydrocephalus. No  extra-axial fluid collection. Pituitary gland suprasellar region within normal limits. Midline structures intact. Vascular: Major intracranial vascular flow voids are maintained. Skull and upper cervical spine: Craniocervical junction within normal limits. Upper cervical spine normal. Bone marrow signal intensity within normal limits. No scalp soft tissue abnormality. Sinuses/Orbits: Globes orbital soft tissues demonstrate no acute finding. Axial myopia noted. Mild scattered mucosal thickening within the ethmoidal air cells and maxillary sinuses. Paranasal sinuses are otherwise clear. No mastoid effusion. Inner ear structures grossly normal. Other: None. MRA HEAD FINDINGS ANTERIOR CIRCULATION: Examination mildly degraded by motion artifact. Distal cervical segments of the internal carotid arteries are patent with symmetric antegrade flow. Petrous segments patent bilaterally without flow-limiting stenosis. Scattered atheromatous irregularity throughout the cavernous/supraclinoid ICAs bilaterally. No significant stenosis on the right. There is moderate multifocal narrowing involving the cavernous/supraclinoid left ICA (up to approximately 50%). ICA termini well perfused. A1 segments patent bilaterally. Normal anterior communicating artery. Anterior cerebral arteries patent to their distal aspects without stenosis. No M1 stenosis or occlusion. Normal MCA bifurcations. Distal MCA branches well perfused and symmetric. Distal small vessel atheromatous irregularity noted. POSTERIOR CIRCULATION: Vertebral arteries patent to the vertebrobasilar junction without stenosis. Left vertebral artery slightly dominant. Right PICA patent. Left PICA not visualized, suspected to be occluded.  Proximal and mid basilar artery widely patent. Moderate focal stenosis noted at the basilar tip (series 1075, image 11). Superior cerebral arteries patent bilaterally. Both of the posterior cerebral arteries primarily supplied via the basilar. PCAs  demonstrate mild atheromatous irregularity but are well perfused to their distal aspects without high-grade stenosis. No intracranial aneurysm. IMPRESSION: MRI HEAD IMPRESSION: 1. 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 Associated edema with mild mass effect on the adjacent fourth ventricular outflow tract. No associated obstructive hydrocephalus at this time. Associated prominent petechial hemorrhage without frank hemorrhagic transformation. 2. Additional scattered predominantly subcentimeter acute to early subacute ischemic infarcts involving the bilateral cerebral hemispheres, brainstem, and right cerebellum. Given the various vascular distributions involved, a central thromboembolic source is suspected. 3. Underlying mild to moderate chronic microvascular ischemic disease. MRA HEAD IMPRESSION: 1. Nonvisualization of the left PICA, suspected to be occluded given the left PICA territory infarct. 2. Moderate atherosclerotic change involving the left carotid siphon with associated moderate multifocal narrowing (approximate up to 50%). 3. Focal moderate basilar tip stenosis. 4. Additional scattered small vessel atheromatous irregularity elsewhere throughout the intracranial circulation. No other hemodynamically significant or correctable stenosis. Electronically Signed   By: Jeannine Boga M.D.   On: 03/01/2019 00:40   Mr Brain Wo Contrast  Result Date: 03/01/2019 CLINICAL DATA:  Initial evaluation for EXAM: MRI HEAD WITHOUT CONTRAST MRA HEAD WITHOUT CONTRAST TECHNIQUE: Multiplanar, multiecho pulse sequences of the brain and surrounding structures were obtained without intravenous contrast. Angiographic images of the head were obtained using MRA technique without contrast. COMPARISON:  Comparison made with prior CT from earlier the same day as well as previous MRI from 02/17/2019. FINDINGS: MRI HEAD FINDINGS Brain: Generalized age-related  cerebral atrophy with mild to moderate chronic microvascular ischemic disease. Confluent restricted diffusion involving the inferior left cerebellum compatible with left PICA territory infarct, early subacute in appearance. Associated mild localized edema, with mild mass effect on the adjacent fourth ventricular outflow tract. Fourth ventricle remains patent at this time with no evidence for obstructive hydrocephalus. Associated prominent petechial hemorrhage on SWI sequence without frank hemorrhagic transformation. Multiple additional scattered predominantly subcentimeter acute to early subacute ischemic infarcts seen elsewhere throughout the bilateral cerebral hemispheres as well as the right cerebellum. Largest of these foci within the right cerebral hemisphere measures 1 cm and is positioned within the mid right corona radiata (series 5, image 79). Largest focus within the left cerebral hemisphere involves the left periatrial white matter and measures 8 mm (series 5, image 72). Punctate 5 mm infarct within the mid right cerebellum (series 5, image 58). No made of a 6 mm ischemic infarct involving the left paramedian pons (series 5, image 65). No associated mass effect or hemorrhage about these additional infarcts. Gray-white matter differentiation otherwise maintained. Additional single chronic microhemorrhage noted within the right cerebellum. No mass lesion or midline shift. No hydrocephalus. No extra-axial fluid collection. Pituitary gland suprasellar region within normal limits. Midline structures intact. Vascular: Major intracranial vascular flow voids are maintained. Skull and upper cervical spine: Craniocervical junction within normal limits. Upper cervical spine normal. Bone marrow signal intensity within normal limits. No scalp soft tissue abnormality. Sinuses/Orbits: Globes orbital soft tissues demonstrate no acute finding. Axial myopia noted. Mild scattered mucosal thickening within the ethmoidal air  cells and maxillary sinuses. Paranasal sinuses are otherwise clear. No mastoid effusion. Inner ear structures grossly normal. Other: None. MRA HEAD FINDINGS ANTERIOR CIRCULATION: Examination mildly degraded by motion artifact. Distal cervical segments of  the internal carotid arteries are patent with symmetric antegrade flow. Petrous segments patent bilaterally without flow-limiting stenosis. Scattered atheromatous irregularity throughout the cavernous/supraclinoid ICAs bilaterally. No significant stenosis on the right. There is moderate multifocal narrowing involving the cavernous/supraclinoid left ICA (up to approximately 50%). ICA termini well perfused. A1 segments patent bilaterally. Normal anterior communicating artery. Anterior cerebral arteries patent to their distal aspects without stenosis. No M1 stenosis or occlusion. Normal MCA bifurcations. Distal MCA branches well perfused and symmetric. Distal small vessel atheromatous irregularity noted. POSTERIOR CIRCULATION: Vertebral arteries patent to the vertebrobasilar junction without stenosis. Left vertebral artery slightly dominant. Right PICA patent. Left PICA not visualized, suspected to be occluded. Proximal and mid basilar artery widely patent. Moderate focal stenosis noted at the basilar tip (series 1075, image 11). Superior cerebral arteries patent bilaterally. Both of the posterior cerebral arteries primarily supplied via the basilar. PCAs demonstrate mild atheromatous irregularity but are well perfused to their distal aspects without high-grade stenosis. No intracranial aneurysm. IMPRESSION: MRI HEAD IMPRESSION: 1. 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 Associated edema with mild mass effect on the adjacent fourth ventricular outflow tract. No associated obstructive hydrocephalus at this time. Associated prominent petechial hemorrhage without frank hemorrhagic transformation. 2.  Additional scattered predominantly subcentimeter acute to early subacute ischemic infarcts involving the bilateral cerebral hemispheres, brainstem, and right cerebellum. Given the various vascular distributions involved, a central thromboembolic source is suspected. 3. Underlying mild to moderate chronic microvascular ischemic disease. MRA HEAD IMPRESSION: 1. Nonvisualization of the left PICA, suspected to be occluded given the left PICA territory infarct. 2. Moderate atherosclerotic change involving the left carotid siphon with associated moderate multifocal narrowing (approximate up to 50%). 3. Focal moderate basilar tip stenosis. 4. Additional scattered small vessel atheromatous irregularity elsewhere throughout the intracranial circulation. No other hemodynamically significant or correctable stenosis. Electronically Signed   By: Jeannine Boga M.D.   On: 03/01/2019 00:40   Vas US Carotid (at [place] Only)  Result Date: 03/02/2019 Carotid Arterial Duplex Study Indications:       CVA and Dizziness. Risk Factors:      Hypertension, hyperlipidemia. Limitations:       Hypertensive urgency Comparison Study:  No prior study on file for comparison. Performing Technologist: Sharion Dove RVS  Examination Guidelines: A complete evaluation includes B-mode imaging, spectral Doppler, color Doppler, and power Doppler as needed of all accessible portions of each vessel. Bilateral testing is considered an integral part of a complete examination. Limited examinations for reoccurring indications may be performed as noted.  Right Carotid Findings: +----------+--------+--------+--------+-----------+------------------+             PSV cm/s EDV cm/s Stenosis Describe    Comments            +----------+--------+--------+--------+-----------+------------------+  CCA Prox   123      23                            intimal thickening  +----------+--------+--------+--------+-----------+------------------+  CCA Distal 134       25                                                +----------+--------+--------+--------+-----------+------------------+  ICA Prox   108      19  homogeneous                     +----------+--------+--------+--------+-----------+------------------+  ICA Distal 78       28                                                +----------+--------+--------+--------+-----------+------------------+  ECA        155      18                                                +----------+--------+--------+--------+-----------+------------------+ +----------+--------+-------+--------+-------------------+             PSV cm/s EDV cms Describe Arm Pressure (mmHG)  +----------+--------+-------+--------+-------------------+  Subclavian 140                                            +----------+--------+-------+--------+-------------------+ +---------+--------+--+--------+--+  Vertebral PSV cm/s 94 EDV cm/s 26  +---------+--------+--+--------+--+  Left Carotid Findings: +----------+--------+--------+--------+-----------+------------------+             PSV cm/s EDV cm/s Stenosis Describe    Comments            +----------+--------+--------+--------+-----------+------------------+  CCA Prox   144      24                            intimal thickening  +----------+--------+--------+--------+-----------+------------------+  CCA Distal 144      18                            intimal thickening  +----------+--------+--------+--------+-----------+------------------+  ICA Prox   94       16                homogeneous                     +----------+--------+--------+--------+-----------+------------------+  ICA Distal 98       36                                                +----------+--------+--------+--------+-----------+------------------+  ECA        180      20                                                +----------+--------+--------+--------+-----------+------------------+  +----------+--------+--------+--------+-------------------+  Subclavian PSV cm/s EDV cm/s Describe Arm Pressure (mmHG)  +----------+--------+--------+--------+-------------------+             139                                             +----------+--------+--------+--------+-------------------+ +---------+--------+--+--------+--+  Vertebral PSV cm/s 68  EDV cm/s 22  +---------+--------+--+--------+--+  Summary:  Left Carotid: The extracranial vessels were near-normal with only minimal wall               thickening or plaque. Vertebrals:  Bilateral vertebral arteries demonstrate antegrade flow. Subclavians: Normal flow hemodynamics were seen in bilateral subclavian              arteries. *See table(s) above for measurements and observations.  Electronically signed by Antony Contras MD on 03/02/2019 at 1:01:37 PM.    Final    Vas [place] Lower Extremity Venous (dvt)  Result Date: 03/02/2019  Lower Venous Study Indications: Stroke.  Risk Factors: None identified. Comparison Study: No prior studies. Performing Technologist: Oliver Hum RVT  Examination Guidelines: A complete evaluation includes B-mode imaging, spectral Doppler, color Doppler, and power Doppler as needed of all accessible portions of each vessel. Bilateral testing is considered an integral part of a complete examination. Limited examinations for reoccurring indications may be performed as noted.  +---------+---------------+---------+-----------+----------+-------+  RIGHT     Compressibility Phasicity Spontaneity Properties Summary  +---------+---------------+---------+-----------+----------+-------+  CFV       Full            Yes       Yes                             +---------+---------------+---------+-----------+----------+-------+  SFJ       Full                                                      +---------+---------------+---------+-----------+----------+-------+  FV Prox   Full                                                       +---------+---------------+---------+-----------+----------+-------+  FV Mid    Full                                                      +---------+---------------+---------+-----------+----------+-------+  FV Distal Full                                                      +---------+---------------+---------+-----------+----------+-------+  PFV       Full                                                      +---------+---------------+---------+-----------+----------+-------+  POP       Full            Yes       Yes                             +---------+---------------+---------+-----------+----------+-------+  PTV       Full                                                      +---------+---------------+---------+-----------+----------+-------+  PERO      Full                                                      +---------+---------------+---------+-----------+----------+-------+   +---------+---------------+---------+-----------+----------+-------+  LEFT      Compressibility Phasicity Spontaneity Properties Summary  +---------+---------------+---------+-----------+----------+-------+  CFV       Full            Yes       Yes                             +---------+---------------+---------+-----------+----------+-------+  SFJ       Full                                                      +---------+---------------+---------+-----------+----------+-------+  FV Prox   Full                                                      +---------+---------------+---------+-----------+----------+-------+  FV Mid    Full                                                      +---------+---------------+---------+-----------+----------+-------+  FV Distal Full                                                      +---------+---------------+---------+-----------+----------+-------+  PFV       Full                                                      +---------+---------------+---------+-----------+----------+-------+  POP       Full             Yes       Yes                             +---------+---------------+---------+-----------+----------+-------+  PTV       Full                                                      +---------+---------------+---------+-----------+----------+-------+  PERO      Full                                                      +---------+---------------+---------+-----------+----------+-------+     Summary: Right: There is no evidence of deep vein thrombosis in the lower extremity. No cystic structure found in the popliteal fossa. Left: There is no evidence of deep vein thrombosis in the lower extremity. No cystic structure found in the popliteal fossa.  *See table(s) above for measurements and observations.    Preliminary         Scheduled Meds:  aspirin EC  81 mg Oral Daily   atorvastatin  80 mg Oral q1800   clopidogrel  75 mg Oral Daily   enoxaparin (LOVENOX) injection  40 mg Subcutaneous Q24H   hydrochlorothiazide  25 mg Oral Daily   insulin aspart  0-15 Units Subcutaneous TID WC   insulin aspart  0-5 Units Subcutaneous QHS   irbesartan  300 mg Oral Daily   living well with diabetes book   Does not apply Once   Continuous Infusions:   LOS: 2 days    Time spent:35 mins. More than 50% of that time was spent in counseling and/or coordination of care.      Shelly Coss, MD [facility] Pager [phone]  If 7PM-7AM, please contact night-coverage [email or web address] Password [email or web address] 03/02/2019, 2:06 PM

## 2019-03-02 NOTE — Progress Notes (Signed)
STROKE TEAM PROGRESS NOTE       SUBJECTIVE (INTERVAL HISTORY) No one is at the bedside.  He states improvement in his dizziness.  Blood pressure is better controlled.   TCD Bubble study negative for PFO. TEE was negative as well. He is refusing loop recorder OBJECTIVE Vitals:   03/02/19 1203 03/02/19 1231 03/02/19 1246 03/02/19 1300  BP: (!) 204/100 130/74 (!) 151/80 (!) 172/94  Pulse:  78 77 72  Resp:  18 18 18   Temp:  98.4 F (36.9 C)  98.7 F (37.1 C)  TempSrc:  Oral  Axillary  SpO2:  99% 100% 97%  Weight:      Height:        CBC:  Recent Labs  Lab 02/28/19 1930 02/28/19 1944 03/01/19 0536  WBC 11.4*  --  11.1*  NEUTROABS 9.0*  --   --   HGB 13.8 12.6* 12.9*  HCT 37.3* 37.0* 35.4*  MCV 80.6  --  80.5  PLT 244  --  161    Basic Metabolic Panel:  Recent Labs  Lab 03/01/19 0536 03/02/19 0525  NA 135 137  K 3.0* 3.4*  CL 100 102  CO2 25 26  GLUCOSE 236* 197*  BUN 19 19  CREATININE 1.11 1.20  CALCIUM 8.4* 8.9    Lipid Panel:     Component Value Date/Time   CHOL 114 03/01/2019 0536   TRIG 80 03/01/2019 0536   HDL 39 (L) 03/01/2019 0536   CHOLHDL 2.9 03/01/2019 0536   VLDL 16 03/01/2019 0536   LDLCALC 59 03/01/2019 0536   LDLCALC 148 (H) 07/02/2018 1030   HgbA1c:  Lab Results  Component Value Date   HGBA1C 10.1 (H) 03/01/2019   Urine Drug Screen:     Component Value Date/Time   LABOPIA NONE DETECTED 02/28/2019 1831   COCAINSCRNUR NONE DETECTED 02/28/2019 1831   LABBENZ NONE DETECTED 02/28/2019 1831   AMPHETMU NONE DETECTED 02/28/2019 1831   THCU NONE DETECTED 02/28/2019 1831   LABBARB NONE DETECTED 02/28/2019 1831    Alcohol Level     Component Value Date/Time   ETH <10 02/28/2019 1930    IMAGING  Ct Head Wo Contrast 02/28/2019 IMPRESSION:  1. Acute/subacute nonhemorrhagic stroke involving the LEFT INFERIOR cerebellum.  2. Otherwise normal examination.   Mr Angio Head Wo Contrast 03/01/2019  MRI HEAD  IMPRESSION:  1.  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 Associated edema with mild mass effect on the adjacent fourth ventricular outflow tract. No associated obstructive hydrocephalus at this time. Associated prominent petechial hemorrhage without frank hemorrhagic transformation.  2. Additional scattered predominantly subcentimeter acute to early subacute ischemic infarcts involving the bilateral cerebral hemispheres, brainstem, and right cerebellum. Given the various vascular distributions involved, a central thromboembolic source is suspected.  3. Underlying mild to moderate chronic microvascular ischemic disease.   MRA HEAD  IMPRESSION:  1. Nonvisualization of the left PICA, suspected to be occluded given the left PICA territory infarct.  2. Moderate atherosclerotic change involving the left carotid siphon with associated moderate multifocal narrowing (approximate up to 50%).  3. Focal moderate basilar tip stenosis.  4. Additional scattered small vessel atheromatous irregularity elsewhere throughout the intracranial circulation. No other hemodynamically significant or correctable stenosis.    Dg Chest Port 1 View 02/28/2019 IMPRESSION:  Interval mild cardiomegaly and mild pulmonary vascular congestion.    CT Head Wo Contrast - pending    Transthoracic Echocardiogram  02/28/2019 IMPRESSIONS  1. The left  ventricle has hyperdynamic systolic function, with an ejection fraction of >65%. The cavity size was normal. There is mildly increased left ventricular wall thickness. Left ventricular diastolic Doppler parameters are consistent with  impaired relaxation. Elevated mean left atrial pressure.  2. The right ventricle has normal systolic function. The cavity was normal.  3. The mitral valve is grossly normal.  4. There is mild dilatation of the aortic root measuring 38 mm.  5. The tricuspid valve is grossly normal.  6. The aortic valve is  tricuspid. No stenosis of the aortic valve.  7. Vigorous LV systolic function; mild LVH; mild diastolic dysfunction; mildly dilated aortic root.   Bilateral Carotid Dopplers  No significant stenosis bilaterally   EKG - SR rate 95 BPM. Possible old Mi. (See cardiology reading for complete details)    PHYSICAL EXAM Blood pressure (!) 172/94, pulse 72, temperature 98.7 F (37.1 C), temperature source Axillary, resp. rate 18, height 5\' 9"  (1.753 m), weight 81.7 kg, SpO2 97 %. Pleasant middle-aged Caucasian male not in distress. . Afebrile. Head is nontraumatic. Neck is supple without bruit.    Cardiac exam no murmur or gallop. Lungs are clear to auscultation. Distal pulses are well felt. Neurological Exam ;  Awake  Alert oriented x 3. Normal speech and language.eye movements full with endgaze  Nystagmus on right lateral gaze.fundi were not visualized. Vision acuity and fields appear normal. Hearing is normal. Palatal movements are normal. Face symmetric. Tongue midline. Normal strength, tone, reflexes and coordination. Normal sensation. Gait deferred.      ASSESSMENT/PLAN Gabriel Hoover is a 58 y.o. male with history of Htn, Hld, DM, anxiety and admission 1 week ago for acute monocular vision loss lasting for 10 minutes diagnosed as amaurosis fugax presenting with a one wk hx of dizziness and elevated BP (SBP in the 220s)  . 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 no hydrocephalous  MRI head - Moderate-size confluent early subacute left PICA territory infarct involving the inferior left cerebellum. Associated edema with mild mass effect on the adjacent fourth ventricular outflow tract.  Additional scattered predominantly subcentimeter acute to early subacute ischemic infarcts involving the  bilateral cerebral hemispheres, brainstem, and right cerebellum.  MRA head -  Nonvisualization of the left PICA, suspected to be occluded given the left PICA territory infarct.   CTA H&N  - not ordered   Carotid Doppler - no significant stenosis  2D Echo  - EF >65%. No cardiac source of emboli identified.   S[place] Corona Virus 2  - negative  LDL - 148  HgbA1c - 10.1  UDS - negative  VTE prophylaxis - lovenox  Diet  - Heart healthy with thin liquids.  aspirin 81 mg daily and clopidogrel 75 mg daily prior to admission, now on aspirin 81 mg daily and clopidogrel 75 mg daily  Patient counseled to be compliant with his antithrombotic medications  Ongoing aggressive stroke risk factor management  Therapy recommendations:  Possible CIR admission. Rehab MD consult requested  Disposition:  Pending  Hypertension  Stable . Permissive hypertension (OK if < 220/120) but gradually normalize in 5-7 days . Long-term BP goal normotensive  Hyperlipidemia  Lipid lowering medication PTA:  Lipitor 40 mg daily  LDL 148, goal < 70  Current lipid lowering medication: Lipitor 40 mg  daily (increase to 80 mg daily)  Continue statin at discharge  Diabetes  HgbA1c 10.1,  goal < 7.0  Uncontrolled  Other Stroke Risk Factors  Hx stroke/TIA  Other Active Problems  Severe needle phobia (requires sedation) limiting ability to draw blood.   Hypokalemia - 3.0 - supplemented  Mild leukocytosis  Mild anemia   Hospital day # 2    Recommend   continue cardiac monitoring.  Aspirin and Plavix for now. PT/OT consults. Mobilize out of bed as tolerated. Long discussion with the patient and answered questions.  Discussed with Dr. Tamsen Meek.  Greater than 50% time during this 25-minute visit was spent on counseling and coordination of care about his embolic strokes and discussion about evaluation and treatment plan and answering questions. Antony Contras, MD Medical Director [facility]  [facility] Pager: [phone] 03/02/2019 3:52 PM   To contact Stroke Continuity provider, please refer to [email or web address] After hours, contact General Neurology

## 2019-03-02 NOTE — Consult Note (Addendum)
ELECTROPHYSIOLOGY CONSULT NOTE  Patient ID: Gabriel Hoover MRN: [id number], DOB/AGE: Jul 14, 1961   Admit date: 02/28/2019 Date of Consult: 03/02/2019  Primary Physician: Unk Pinto, MD Primary Cardiologist: None Reason for Consultation: Cryptogenic stroke ; recommendations regarding Implantable Loop Recorder, requested by Dr. Leonie Man   History of Present Illness Yovanni Frenette was admitted on 02/28/2019 with dizziness, HTN ooc and stroke.   PMHx includes: DM and HTN, HLD, and recent visit here for monocular vision loss lasting for 10 minutes diagnosed with amaurosis fugax, HTN w/SBP 220 Neurology notes: infarct of inferior left cerebellum - likely embolic - unknown source. MRI head - Moderate-size confluent early subacute left PICA territory infarct involving the inferior left cerebellum. Associated edema with mild mass effect on the adjacent fourth ventricular outflow tract.  Additional scattered predominantly subcentimeter acute to early subacute ischemic infarcts involving the bilateral cerebral hemispheres, brainstem, and right cerebellum  he has undergone workup for stroke including echocardiogram and carotid dopplers.  The patient has been monitored on telemetry which has demonstrated sinus rhythm with no arrhythmias.  Inpatient stroke work-up is to be completed with a TEE.     Echocardiogram this admission demonstrated   Transthoracic Echocardiogram  02/28/2019 IMPRESSIONS 1. The left ventricle has hyperdynamic systolic function, with an ejection fraction of >65%. The cavity size was normal. There is mildly increased left ventricular wall thickness. Left ventricular diastolic Doppler parameters are consistent with  impaired relaxation. Elevated mean left atrial pressure. 2. The right ventricle has normal systolic function. The cavity was normal. 3. The mitral valve is grossly normal. 4. There is mild dilatation of the aortic root measuring 38 mm. 5. The tricuspid valve is  grossly normal. 6. The aortic valve is tricuspid. No stenosis of the aortic valve. 7. Vigorous LV systolic function; mild LVH; mild diastolic dysfunction; mildly dilated aortic root.   Lab work is reviewed.   Prior to admission, the patient denies chest pain, shortness of breath, dizziness, palpitations, or syncope.  They are recovering from their stroke with plans to likely to CIR, (rehab consult has been requested) at discharge.   Past Medical History:  Diagnosis Date   Anxiety    Hyperlipidemia    Hypertension    Migraines    Prediabetes    Vitamin D deficiency      Surgical History: History reviewed. No pertinent surgical history.   Medications Prior to Admission  Medication Sig Dispense Refill Last Dose   aspirin EC 81 MG EC tablet Take 1 tablet (81 mg total) by mouth daily. 90 tablet 0 02/26/2019 at Unknown time   atorvastatin (LIPITOR) 40 MG tablet Take 1 tablet (40 mg total) by mouth daily at 6 PM. 30 tablet 0 02/26/2019   clopidogrel (PLAVIX) 75 MG tablet Take 1 tablet (75 mg total) by mouth daily for 21 days. 21 tablet 0 02/26/2019   valsartan-hydrochlorothiazide (DIOVAN-HCT) 320-25 MG tablet Take 1 tablet by mouth daily. 30 tablet 0 02/26/2019   amLODipine (NORVASC) 10 MG tablet Defer starting medication until titrated up to valsartan/hctz full tab and tolerating. Then start 1/2 tab daily for 2 weeks, then full tab. (Patient not taking: Reported on 02/25/2019) 30 tablet 0 Not Taking at Unknown time    Inpatient Medications:   aspirin EC  81 mg Oral Daily   atorvastatin  80 mg Oral q1800   clopidogrel  75 mg Oral Daily   enoxaparin (LOVENOX) injection  40 mg Subcutaneous Q24H   hydrochlorothiazide  25 mg Oral Daily   insulin  aspart  0-15 Units Subcutaneous TID WC   insulin aspart  0-5 Units Subcutaneous QHS   irbesartan  300 mg Oral Daily   living well with diabetes book   Does not apply Once    Allergies:  Allergies  Allergen Reactions    Atenolol Other (See Comments)    Made tired    Social History   Socioeconomic History   Marital status: Married    Spouse name: Not on file   Number of children: Not on file   Years of education: Not on file   Highest education level: Not on file  Occupational History   Not on file  Social Needs   Financial resource strain: Not on file   Food insecurity    Worry: Not on file    Inability: Not on file   Transportation needs    Medical: Not on file    Non-medical: Not on file  Tobacco Use   Smoking status: Never Smoker   Smokeless tobacco: Never Used  Substance and Sexual Activity   Alcohol use: No   Drug use: No   Sexual activity: Not on file  Lifestyle   Physical activity    Days per week: Not on file    Minutes per session: Not on file   Stress: Not on file  Relationships   Social connections    Talks on phone: Not on file    Gets together: Not on file    Attends religious service: Not on file    Active member of club or organization: Not on file    Attends meetings of clubs or organizations: Not on file    Relationship status: Not on file   Intimate partner violence    Fear of current or ex partner: Not on file    Emotionally abused: Not on file    Physically abused: Not on file    Forced sexual activity: Not on file  Other Topics Concern   Not on file  Social History Narrative   Not on file     Family History  Problem Relation Age of Onset   Hyperlipidemia Father    Heart disease Father       Review of Systems: All other systems reviewed and are otherwise negative except as noted above.  Physical Exam: Vitals:   03/01/19 2315 03/02/19 0410 03/02/19 0700 03/02/19 0924  BP: 137/73 (!) 158/91 (!) 175/104 (!) 179/107  Pulse: 61 61 (!) 58   Resp: 18 18 18    Temp: 98.8 F (37.1 C) 99 F (37.2 C) 98.6 F (37 C)   TempSrc: Oral Oral Oral   SpO2: 96% 97% 97%   Weight:      Height:        GEN- The patient is well appearing,  alert and oriented x 3 today.   Head- normocephalic, atraumatic Eyes-  Sclera clear, conjunctiva pink Ears- hearing intact Oropharynx- clear Neck- supple Lungs- CTA b/l, normal work of breathing Heart- RRR, no murmurs, rubs or gallops  GI- soft, NT, ND Extremities- no clubbing, cyanosis, or edema MS- no significant deformity Skin- ecchymosis to L shoulder, no rash or lesions otherwise Psych- euthymic mood, full affect   Labs:   Lab Results  Component Value Date   WBC 11.1 (H) 03/01/2019   HGB 12.9 (L) 03/01/2019   HCT 35.4 (L) 03/01/2019   MCV 80.5 03/01/2019   PLT 244 03/01/2019    Recent Labs  Lab 03/01/19 0536 03/02/19 0525  NA 135 137  K 3.0* 3.4*  CL 100 102  CO2 25 26  BUN 19 19  CREATININE 1.11 1.20  CALCIUM 8.4* 8.9  PROT 5.8*  --   BILITOT 1.1  --   ALKPHOS 64  --   ALT 17  --   AST 15  --   GLUCOSE 236* 197*   No results found for: CKTOTAL, CKMB, CKMBINDEX, TROPONINI Lab Results  Component Value Date   CHOL 114 03/01/2019   CHOL 230 (H) 07/02/2018   CHOL 216 (H) 04/02/2018   Lab Results  Component Value Date   HDL 39 (L) 03/01/2019   HDL 44 07/02/2018   HDL 44 04/02/2018   Lab Results  Component Value Date   LDLCALC 59 03/01/2019   LDLCALC 148 (H) 07/02/2018   LDLCALC 138 (H) 04/02/2018   Lab Results  Component Value Date   TRIG 80 03/01/2019   TRIG 249 (H) 07/02/2018   TRIG 207 (H) 04/02/2018   Lab Results  Component Value Date   CHOLHDL 2.9 03/01/2019   CHOLHDL 5.2 (H) 07/02/2018   CHOLHDL 4.9 04/02/2018   No results found for: LDLDIRECT  No results found for: DDIMER   Radiology/Studies:   Ct Angio Head W Or Wo Contrast Result Date: 02/17/2019 CLINICAL DATA:  Left eye vision loss. EXAM: CT ANGIOGRAPHY HEAD AND NECK TECHNIQUE: Multidetector CT imaging of the head and neck was performed using the standard protocol during bolus administration of intravenous contrast. Multiplanar CT image reconstructions and MIPs were obtained to  evaluate the vascular anatomy. Carotid stenosis measurements (when applicable) are obtained utilizing NASCET criteria, using the distal internal carotid diameter as the denominator. CONTRAST:  162mL OMNIPAQUE IOHEXOL 350 MG/ML SOLN COMPARISON:  None. FINDINGS: CT HEAD FINDINGS Brain: There is no mass, hemorrhage or extra-axial collection. The size and configuration of the ventricles and extra-axial CSF spaces are normal. There is no acute or chronic infarction. The brain parenchyma is normal. Skull: The visualized skull base, calvarium and extracranial soft tissues are normal. Sinuses/Orbits: No fluid levels or advanced mucosal thickening of the visualized paranasal sinuses. No mastoid or middle ear effusion. The orbits are normal. CTA NECK FINDINGS SKELETON: There is no bony spinal canal stenosis. No lytic or blastic lesion. OTHER NECK: Normal pharynx, larynx and major salivary glands. No cervical lymphadenopathy. Heterogeneous thyroid gland. UPPER CHEST: No pneumothorax or pleural effusion. No nodules or masses. AORTIC ARCH: There is no calcific atherosclerosis of the aortic arch. There is no aneurysm, dissection or hemodynamically significant stenosis of the visualized ascending aorta and aortic arch. Conventional 3 vessel aortic branching pattern. The visualized proximal subclavian arteries are widely patent. RIGHT CAROTID SYSTEM: --Common carotid artery: Widely patent origin without common carotid artery dissection or aneurysm. --Internal carotid artery: No dissection, occlusion or aneurysm. Mild atherosclerotic calcification at the carotid bifurcation without hemodynamically significant stenosis. --External carotid artery: No acute abnormality. LEFT CAROTID SYSTEM: --Common carotid artery: Widely patent origin without common carotid artery dissection or aneurysm. --Internal carotid artery: No dissection, occlusion or aneurysm. Mild atherosclerotic lack at the carotid bifurcation without hemodynamically  significant stenosis. --External carotid artery: No acute abnormality. VERTEBRAL ARTERIES: Codominant configuration. Mild narrowing at the left vertebral artery origin. No dissection, occlusion or flow-limiting stenosis to the skull base (V1-V3 segments). CTA HEAD FINDINGS POSTERIOR CIRCULATION: --Vertebral arteries: Normal V4 segments. --Posterior inferior cerebellar arteries (PICA): Patent origins from the vertebral arteries. --Anterior inferior cerebellar arteries (AICA): Patent origins from the basilar artery. --Basilar artery: Normal. --Superior cerebellar arteries: Normal. --Posterior cerebral arteries (  PCA): Normal. Both originate from the basilar artery. Posterior communicating arteries (p-comm) are diminutive or absent. ANTERIOR CIRCULATION: --Intracranial internal carotid arteries: Normal. --Anterior cerebral arteries (ACA): Normal. Both A1 segments are present. Patent anterior communicating artery (a-comm). --Middle cerebral arteries (MCA): Normal. VENOUS SINUSES: As permitted by contrast timing, patent. ANATOMIC VARIANTS: None Review of the MIP images confirms the above findings. IMPRESSION: 1. No emergent large vessel occlusion or hemodynamically significant stenosis by NASCET criteria. 2. Mild bilateral carotid bifurcation atherosclerosis. 3. Mild narrowing of the left vertebral artery origin. Electronically Signed   By: Ulyses Jarred M.D.   On: 02/17/2019 16:38     Ct Head Wo Contrast Result Date: 03/01/2019 CLINICAL DATA:  Followup widespread acute infarctions EXAM: CT HEAD WITHOUT CONTRAST TECHNIQUE: Contiguous axial images were obtained from the base of the skull through the vertex without intravenous contrast. COMPARISON:  CT and MRI studies yesterday. FINDINGS: Brain: Confluent acute infarction within the inferior cerebellum on the left shows low-density and swelling without hemorrhage. Mild mass-effect upon the fourth ventricle but no ventricular obstruction. Low-density in the right  frontal white matter corresponds to a acute white matter infarction. Few other scattered white matter low densities as well. No sign of new infarction, hydrocephalus or extra-axial collection. Vascular: There is atherosclerotic calcification of the major vessels at the base of the brain. Skull: Negative Sinuses/Orbits: Clear/normal Other: None IMPRESSION: Low-density and swelling within the inferior cerebellar infarction on the left. No hemorrhagic transformation. Mass-effect upon the fourth ventricle but no ventricular obstruction. Other small acute white matter infarctions evident within the cerebral hemispheric white matter as better shown by MRI. No newly seen infarction. Electronically Signed   By: Nelson Chimes M.D.   On: 03/01/2019 12:59      Mr Angio Head Wo Contrast Result Date: 03/01/2019 CLINICAL DATA:  Initial evaluation for EXAM: MRI HEAD WITHOUT CONTRAST MRA HEAD WITHOUT CONTRAST TECHNIQUE: Multiplanar, multiecho pulse sequences of the brain and surrounding structures were obtained without intravenous contrast. Angiographic images of the head were obtained using MRA technique without contrast. COMPARISON:  Comparison made with prior CT from earlier the same day as well as previous MRI from 02/17/2019. FINDINGS: MRI HEAD FINDINGS Brain: Generalized age-related cerebral atrophy with mild to moderate chronic microvascular ischemic disease. Confluent restricted diffusion involving the inferior left cerebellum compatible with left PICA territory infarct, early subacute in appearance. Associated mild localized edema, with mild mass effect on the adjacent fourth ventricular outflow tract. Fourth ventricle remains patent at this time with no evidence for obstructive hydrocephalus. Associated prominent petechial hemorrhage on SWI sequence without frank hemorrhagic transformation. Multiple additional scattered predominantly subcentimeter acute to early subacute ischemic infarcts seen elsewhere throughout the  bilateral cerebral hemispheres as well as the right cerebellum. Largest of these foci within the right cerebral hemisphere measures 1 cm and is positioned within the mid right corona radiata (series 5, image 79). Largest focus within the left cerebral hemisphere involves the left periatrial white matter and measures 8 mm (series 5, image 72). Punctate 5 mm infarct within the mid right cerebellum (series 5, image 58). No made of a 6 mm ischemic infarct involving the left paramedian pons (series 5, image 65). No associated mass effect or hemorrhage about these additional infarcts. Gray-white matter differentiation otherwise maintained. Additional single chronic microhemorrhage noted within the right cerebellum. No mass lesion or midline shift. No hydrocephalus. No extra-axial fluid collection. Pituitary gland suprasellar region within normal limits. Midline structures intact. Vascular: Major intracranial vascular flow voids are maintained.  Skull and upper cervical spine: Craniocervical junction within normal limits. Upper cervical spine normal. Bone marrow signal intensity within normal limits. No scalp soft tissue abnormality. Sinuses/Orbits: Globes orbital soft tissues demonstrate no acute finding. Axial myopia noted. Mild scattered mucosal thickening within the ethmoidal air cells and maxillary sinuses. Paranasal sinuses are otherwise clear. No mastoid effusion. Inner ear structures grossly normal. Other: None. MRA HEAD FINDINGS ANTERIOR CIRCULATION: Examination mildly degraded by motion artifact. Distal cervical segments of the internal carotid arteries are patent with symmetric antegrade flow. Petrous segments patent bilaterally without flow-limiting stenosis. Scattered atheromatous irregularity throughout the cavernous/supraclinoid ICAs bilaterally. No significant stenosis on the right. There is moderate multifocal narrowing involving the cavernous/supraclinoid left ICA (up to approximately 50%). ICA termini  well perfused. A1 segments patent bilaterally. Normal anterior communicating artery. Anterior cerebral arteries patent to their distal aspects without stenosis. No M1 stenosis or occlusion. Normal MCA bifurcations. Distal MCA branches well perfused and symmetric. Distal small vessel atheromatous irregularity noted. POSTERIOR CIRCULATION: Vertebral arteries patent to the vertebrobasilar junction without stenosis. Left vertebral artery slightly dominant. Right PICA patent. Left PICA not visualized, suspected to be occluded. Proximal and mid basilar artery widely patent. Moderate focal stenosis noted at the basilar tip (series 1075, image 11). Superior cerebral arteries patent bilaterally. Both of the posterior cerebral arteries primarily supplied via the basilar. PCAs demonstrate mild atheromatous irregularity but are well perfused to their distal aspects without high-grade stenosis. No intracranial aneurysm. IMPRESSION: MRI HEAD IMPRESSION: 1. 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 Associated edema with mild mass effect on the adjacent fourth ventricular outflow tract. No associated obstructive hydrocephalus at this time. Associated prominent petechial hemorrhage without frank hemorrhagic transformation. 2. Additional scattered predominantly subcentimeter acute to early subacute ischemic infarcts involving the bilateral cerebral hemispheres, brainstem, and right cerebellum. Given the various vascular distributions involved, a central thromboembolic source is suspected. 3. Underlying mild to moderate chronic microvascular ischemic disease. MRA HEAD IMPRESSION: 1. Nonvisualization of the left PICA, suspected to be occluded given the left PICA territory infarct. 2. Moderate atherosclerotic change involving the left carotid siphon with associated moderate multifocal narrowing (approximate up to 50%). 3. Focal moderate basilar tip stenosis. 4. Additional  scattered small vessel atheromatous irregularity elsewhere throughout the intracranial circulation. No other hemodynamically significant or correctable stenosis. Electronically Signed   By: Jeannine Boga M.D.   On: 03/01/2019 00:40      Dg Chest Port 1 View Result Date: 02/28/2019 CLINICAL DATA:  Dizziness for the past week. Hypertension. EXAM: PORTABLE CHEST 1 VIEW COMPARISON:  02/22/2012. FINDINGS: Interval mildly enlarged cardiac silhouette and mildly prominent pulmonary vasculature. Clear lungs. No pleural fluid. Unremarkable bones. IMPRESSION: Interval mild cardiomegaly and mild pulmonary vascular congestion. Electronically Signed   By: Claudie Revering M.D.   On: 02/28/2019 13:44     Vas US Carotid (at [facility] Only) Result Date: 03/01/2019 Carotid Arterial Duplex Study Indications:  CVA and Dizziness. Risk Factors: Hypertension, hyperlipidemia. Limitations:  Hypertensive urgency Performing Technologist: Sharion Dove RVS  Examination Guidelines: A complete evaluation includes B-mode imaging, spectral Doppler, color Doppler, and power Doppler as needed of all accessible portions of each vessel. Bilateral testing is considered an integral part of a complete examination. Limited examinations for reoccurring indications may be performed as noted. Summary:  Left Carotid: The extracranial vessels were near-normal with only minimal wall               thickening or plaque. Vertebrals:  Bilateral vertebral arteries demonstrate antegrade flow. Subclavians: Normal flow hemodynamics were seen in bilateral subclavian              arteries. *See table(s) above for measurements and observations.     Preliminary     12-lead ECG SR All prior EKG's in EPIC reviewed with no documented atrial fibrillation  Telemetry SR  Assessment and Plan:  1. Cryptogenic stroke The patient presents with cryptogenic stroke.  The patient has a TEE planned for this AM.  I spoke at length with the patient about  monitoring for afib with either a 30 day event monitor or an implantable loop recorder.  Risks, benefits, and alteratives to implantable loop recorder were discussed with the patient today.   At this time, the patient is somewhat overwhelmed with "all of this", he has a documented and apparently very sever needle phobia, and not particularly inclined to want to have a procedure, may want to plan for Event monitoring as 1st strategy, he would like to give it some thought prior to making decision.   Dr. Lovena Le will see later today  Baldwin Jamaica, PA-C 03/02/2019   EP Attending  Patient seen and examined. Agree with the findings as noted above. The patient is a pleasant middle aged man who sustained a posterior circulation stroke, thought to be embolic in nature. He has undergone TEE which did not explain things and he is referred today to consider insertion of an ILR. The patient has no interest in the insertion of an ILR at the present time, despite my recommendation. He is encouraged to call us if changes his mind and decides to have the ILR placed.   Mikle Bosworth.D.

## 2019-03-02 NOTE — CV Procedure (Signed)
    Transesophageal Echocardiogram Note  Gabriel Hoover [id number] March 02, 1961  Procedure: Transesophageal Echocardiogram Indications: CVA   Procedure Details Consent: Obtained Time Out: Verified patient identification, verified procedure, site/side was marked, verified correct patient position, special equipment/implants available, Radiology Safety Procedures followed,  medications/allergies/relevent history reviewed, required imaging and test results available.  Performed  Medications:  During this procedure the patient is administered a Propofol drip by CRNA, Thedore Mins.  He also had Presidex 28 mcg IV .   He was monitored  100% of this time.  Left Ventrical:  Severe LVH,  Normal LV function  Mitral Valve: normal   Aortic Valve: normal   Tricuspid Valve:  normal  Pulmonic Valve: normal  Left Atrium/ Left atrial appendage: no thrombi   Atrial septum: no ASD o rPFO by color duplex or bubble study   Aorta: normal    Complications: No apparent complications Patient did tolerate procedure well.   Thayer Headings, Brooke Bonito., MD, [facility] 03/02/2019, 12:27 PM

## 2019-03-02 NOTE — Transfer of Care (Signed)
Immediate Anesthesia Transfer of Care Note  Patient: Gabriel Hoover  Procedure(s) Performed: TRANSESOPHAGEAL ECHOCARDIOGRAM (TEE) (N/A ) BUBBLE STUDY  Patient Location: Endoscopy Unit  Anesthesia Type:MAC  Level of Consciousness: awake, alert  and oriented  Airway & Oxygen Therapy: Patient Spontanous Breathing and Patient connected to nasal cannula oxygen  Post-op Assessment: Report given to RN, Post -op Vital signs reviewed and stable and Patient moving all extremities X 4  Post vital signs: Reviewed and stable  Last Vitals:  Vitals Value Taken Time  BP 130/74 03/02/19 1231  Temp 36.9 C 03/02/19 1231  Pulse 78 03/02/19 1231  Resp 18 03/02/19 1231  SpO2 99 % 03/02/19 1231    Last Pain:  Vitals:   03/02/19 1231  TempSrc: Oral  PainSc: 0-No pain         Complications: No apparent anesthesia complications

## 2019-03-02 NOTE — Anesthesia Postprocedure Evaluation (Signed)
Anesthesia Post Note  Patient: Gabriel Hoover  Procedure(s) Performed: TRANSESOPHAGEAL ECHOCARDIOGRAM (TEE) (N/A ) BUBBLE STUDY     Patient location during evaluation: PACU Anesthesia Type: MAC Level of consciousness: awake and alert Pain management: pain level controlled Vital Signs Assessment: post-procedure vital signs reviewed and stable Respiratory status: spontaneous breathing, nonlabored ventilation, respiratory function stable and patient connected to nasal cannula oxygen Cardiovascular status: stable and blood pressure returned to baseline Postop Assessment: no apparent nausea or vomiting Anesthetic complications: no    Last Vitals:  Vitals:   03/02/19 1246 03/02/19 1300  BP: (!) 151/80 (!) 172/94  Pulse: 77 72  Resp: 18 18  Temp:  37.1 C  SpO2: 100% 97%    Last Pain:  Vitals:   03/02/19 1600  TempSrc:   PainSc: 0-No pain                 Ryan P Ellender

## 2019-03-02 NOTE — Progress Notes (Addendum)
Inpatient Diabetes Program Recommendations  AACE/ADA: New Consensus Statement on Inpatient Glycemic Control (2015)  Target Ranges:  Prepandial:   less than 140 mg/dL      Peak postprandial:   less than 180 mg/dL (1-2 hours)      Critically ill patients:  140 - 180 mg/dL   Lab Results  Component Value Date   GLUCAP 209 (H) 03/02/2019   HGBA1C 10.1 (H) 03/01/2019    Review of Glycemic Control Results for [name], [name] (MRN [id number]) as of 03/02/2019 09:46  Ref. Range 03/01/2019 16:37 03/01/2019 21:42 03/02/2019 06:28  Glucose-Capillary Latest Ref Range: 70 - 99 mg/dL 215 (H) 210 (H) 209 (H)   Diabetes history: Type 2 DM Outpatient Diabetes medications: none Current orders for Inpatient glycemic control: Novolog 0-15 units TID, Novolog 0-5 units QHS  Inpatient Diabetes Program Recommendations:    Noted A1C, patient has severe needle phobia, and refusing all insulin injections.  Would recommend Levemir 12 units QD, in the event patient was accepting. Will plan to see patient today and focus on lifestyle modification. Will place dietitian consult, LWWDM booklet and outpatient education.   Addendum@1525 : Attempted to speak with patient regarding diabetes. Patient states, "I would rather die than do injections."  Reviewed patient's current A1c of 10.1%. Explained what a A1c is and what it measures. Also reviewed goal A1c with patient, importance of good glucose control @ home, and blood sugar goals. Patient was not appropriate for further teaching, as he began crying stating, "I just want food; i'm starving."   @1545 - Spoke with patient's wife regarding DM diagnosis.  Reviewed patient's current A1c of 10.1%. Explained what a A1c is and what it measures. Also reviewed goal A1c with patient, importance of good glucose control @ home, and blood sugar goals. Reviewed need for insulin, patho of DM, role of pancreas, signs and symptoms of hyperglycemia, side effects of Metformin/Glipizide, vascular  changes and comorbidites. Discussed that patient was refusing insulin while inpatient despite encouragement and glucose trends. Patient refusing, so we also discussed the importance of lifestyle modifications. However, did review that this alone may not be enough to lower A1C.  Patient drinks sodas and has no been watching his diet. Encouraged to work on eliminating sugary beverages, being mindful of carb intake and discussed the plate method. Informed of consults and referrals.  Wife has questions related to husbands status.  MD messaged to call wife and informed on recommendations for discharge as patient is refusing insulin.   Thanks, Bronson Curb, MSN, RNC-OB Diabetes Coordinator [phone] (8a-5p)

## 2019-03-02 NOTE — Progress Notes (Signed)
PT Cancellation Note  Patient Details Name: Gabriel Hoover MRN: [id number] DOB: Oct 10, 1960   Cancelled Treatment:    Reason Eval/Treat Not Completed: Patient at procedure or test/unavailable. Patient at TEE all morning and just arrived back on unit. RN reports he is not up for mobility today and to check back tomorrow.  Gabriel Hoover, PTA Pager [phone] Acute Rehab  Allena Katz 03/02/2019, 1:04 PM

## 2019-03-02 NOTE — Progress Notes (Signed)
Inpatient Rehab Admissions:  Inpatient Rehab Consult received.  I met with patient at the bedside for rehabilitation assessment and to discuss goals and expectations of an inpatient rehab admission.  He is reluctant to stay in the hospital longer for rehab, but asked that I discuss with his wife and see what she wants.  I was able to speak to Helene Kelp and she is hopeful for CIR admission, pending insurance authorization.  I will continue to follow.   Signed: Shann Medal, PT, DPT Admissions Coordinator [phone] 03/02/19  4:56 PM

## 2019-03-02 NOTE — Progress Notes (Signed)
Bilateral lower extremity venous duplex has been completed. Preliminary results can be found in CV Proc through chart review.   03/02/19 10:30 AM Gabriel Hoover RVT

## 2019-03-02 NOTE — Interval H&P Note (Signed)
History and Physical Interval Note:  03/02/2019 11:00 AM  Gabriel Hoover  has presented today for surgery, with the diagnosis of Stroke.  The various methods of treatment have been discussed with the patient and family. After consideration of risks, benefits and other options for treatment, the patient has consented to  Procedure(s): TRANSESOPHAGEAL ECHOCARDIOGRAM (TEE) (N/A) as a surgical intervention.  The patient's history has been reviewed, patient examined, no change in status, stable for surgery.  I have reviewed the patient's chart and labs.  Questions were answered to the patient's satisfaction.     Mertie Moores

## 2019-03-02 NOTE — Anesthesia Preprocedure Evaluation (Signed)
Anesthesia Evaluation  Patient identified by MRN, date of birth, ID band Patient awake    Reviewed: Allergy & Precautions, NPO status , Patient's Chart, lab work & pertinent test results  Airway Mallampati: III  TM Distance: >3 FB Neck ROM: Full    Dental no notable dental hx.    Pulmonary neg pulmonary ROS,    Pulmonary exam normal breath sounds clear to auscultation       Cardiovascular hypertension, Pt. on medications Normal cardiovascular exam Rhythm:Regular Rate:Normal  ECG: SR, rate 95  ECHO: 1. The left ventricle has hyperdynamic systolic function, with an ejection fraction of >65%. The cavity size was normal. There is mildly increased left ventricular wall thickness. Left ventricular diastolic Doppler parameters are consistent with  impaired relaxation. Elevated mean left atrial pressure.  2. The right ventricle has normal systolic function. The cavity was normal.  3. The mitral valve is grossly normal.  4. There is mild dilatation of the aortic root measuring 38 mm.  5. The tricuspid valve is grossly normal.  6. The aortic valve is tricuspid. No stenosis of the aortic valve.  7. Vigorous LV systolic function; mild LVH; mild diastolic dysfunction; mildly dilated aortic root.   Neuro/Psych  Headaches, Anxiety Dizziness CVA    GI/Hepatic negative GI ROS, Neg liver ROS,   Endo/Other  negative endocrine ROS  Renal/GU negative Renal ROS     Musculoskeletal negative musculoskeletal ROS (+)   Abdominal   Peds  Hematology HLD   Anesthesia Other Findings Stroke  Reproductive/Obstetrics                             Anesthesia Physical Anesthesia Plan  ASA: III  Anesthesia Plan: MAC   Post-op Pain Management:    Induction: Intravenous  PONV Risk Score and Plan: 1 and Propofol infusion and Treatment may vary due to age or medical condition  Airway Management Planned: Nasal  Cannula  Additional Equipment:   Intra-op Plan:   Post-operative Plan:   Informed Consent: I have reviewed the patients History and Physical, chart, labs and discussed the procedure including the risks, benefits and alternatives for the proposed anesthesia with the patient or authorized representative who has indicated his/her understanding and acceptance.     Dental advisory given  Plan Discussed with: CRNA  Anesthesia Plan Comments:         Anesthesia Quick Evaluation

## 2019-03-02 NOTE — Progress Notes (Signed)
SLP Cancellation Note  Patient Details Name: Aria Pickrell MRN: [id number] DOB: 30-Jan-1961   Cancelled treatment:       Reason Eval/Treat Not Completed: Patient at procedure or test/unavailable. Patient at TEE procedure. Will f/u next date.   Nadara Mode Tarrell 03/02/2019, 12:50 PM  Sonia Baller, MA, CCC-SLP Speech Therapy [facility] Acute Rehab Pager: [phone]

## 2019-03-02 NOTE — Progress Notes (Signed)
    [facility] has been requested to perform a transesophageal echocardiogram on Gabriel Hoover for CVA.  After careful review of history and examination, the risks and benefits of transesophageal echocardiogram have been explained including risks of esophageal damage, perforation (1:10,000 risk), bleeding, pharyngeal hematoma as well as other potential complications associated with conscious sedation including aspiration, arrhythmia, respiratory failure and death. Alternatives to treatment were discussed, questions were answered. Patient is willing to proceed.  TEE - Dr/ Nahser today round noon. Keep NPO. Meds with sips.   [employer], PA-C 03/02/2019 9:00 AM

## 2019-03-03 ENCOUNTER — Encounter (HOSPITAL_COMMUNITY): Payer: Self-pay | Admitting: Cardiovascular Disease

## 2019-03-03 LAB — GLUCOSE, CAPILLARY
Glucose-Capillary: 197 mg/dL — ABNORMAL HIGH (ref 70–99)
Glucose-Capillary: 221 mg/dL — ABNORMAL HIGH (ref 70–99)
Glucose-Capillary: 221 mg/dL — ABNORMAL HIGH (ref 70–99)

## 2019-03-03 MED ORDER — METFORMIN HCL 1000 MG PO TABS: 1000.0000 mg | ORAL_TABLET | Freq: Two times a day (BID) | ORAL | 0 refills | Status: DC

## 2019-03-03 MED ORDER — AMLODIPINE BESYLATE 10 MG PO TABS: 10.0000 mg | ORAL_TABLET | Freq: Every day | ORAL | 0 refills | Status: DC

## 2019-03-03 MED ORDER — ATORVASTATIN CALCIUM 80 MG PO TABS: 80.0000 mg | ORAL_TABLET | Freq: Every day | ORAL | 0 refills | Status: DC

## 2019-03-03 MED ORDER — GLIPIZIDE 5 MG PO TABS: 2.5000 mg | ORAL_TABLET | Freq: Two times a day (BID) | ORAL | 0 refills | Status: DC

## 2019-03-03 NOTE — Evaluation (Signed)
Speech Language Pathology Evaluation Patient Details Name: Gabriel Hoover MRN: [id number] DOB: May 25, 1961 Today's Date: 03/03/2019 Time: 9702-6378 SLP Time Calculation (min) (ACUTE ONLY): 17 min  Problem List:  Patient Active Problem List   Diagnosis Date Noted  . Acute cerebrovascular accident (CVA) ([place]) 02/28/2019  . Amaurosis fugax 02/17/2019  . Overweight (BMI 25.0-29.9) 05/19/2015  . Poor compliance with medication 05/19/2015  . Medication management 05/14/2014  . Severe needle phobia   . Hyperlipidemia   . Hypertension   . T2_NIDDM ([place])   . Vitamin D deficiency    Past Medical History:  Past Medical History:  Diagnosis Date  . Anxiety   . Hyperlipidemia   . Hypertension   . Migraines   . Prediabetes   . Vitamin D deficiency    Past Surgical History:  Past Surgical History:  Procedure Laterality Date  . BUBBLE STUDY  03/02/2019   Procedure: BUBBLE STUDY;  Surgeon: Thayer Headings, MD;  Location: [place];  Service: Cardiovascular;;  . TEE WITHOUT CARDIOVERSION N/A 03/02/2019   Procedure: TRANSESOPHAGEAL ECHOCARDIOGRAM (TEE);  Surgeon: Acie Fredrickson Wonda Cheng, MD;  Location: [facility] ENDOSCOPY;  Service: Cardiovascular;  Laterality: N/A;   HPI:  Pt is a 58 y.o. male with HTN and DM2 who presented to the [facility] ED with ongoing dizziness x 1 week in conjunction with HTN. He had been admitted on 7/7 for acute monocular vision loss lasting for 10 minutes, then returning over about one hour. He was diagnosed with amaurosis fugax. CT of the head revealed low-density and swelling within the inferior cerebellar infarction on the left. MRI of the brain: Nonvisualization of the left PICA, suspected to be occluded given the left PICA territory infarct.   Assessment / Plan / Recommendation Clinical Impression  Pt reported that he lives with his wife, has a bachelor's degree, and is employed as a Psychiatrist. He denied any baseline deficits in speech, language or cognition. Pt initially  stated that he was having difficulty processing complex information and stated that he feels that his brain is "foggy" but subsequently denied these difficulties. His speech and language skills were within normal limits. Considering his reports, the [facility] Cognitive Assessment 8.1 was completed to evaluate the pt's cognitive-linguistic skills. He achieved a score of 23/30 which is below the normal limits of 26 or more out of 30 and is suggestive of a mild impairment. He demonstrated deficits in the areas of attention, executive function, complex problem solving, abstract reasoning, delayed recall, and orientation to time. Skilled SLP services are clinically indicated at this time to improve his cognitive-linguistic skills. Pt, and nursing were educated regarding results and recommendations; both parties verbalized understanding as well as agreement with plan of care.    SLP Assessment  SLP Recommendation/Assessment: Patient needs continued Speech Lanaguage Pathology Services SLP Visit Diagnosis: Cognitive communication deficit (R41.841)    Follow Up Recommendations  Inpatient Rehab;24 hour supervision/assistance    Frequency and Duration min 2x/week  2 weeks      SLP Evaluation Cognition  Overall Cognitive Status: Impaired/Different from baseline Arousal/Alertness: Awake/alert Orientation Level: Oriented to person;Oriented to place;Oriented to situation;Disoriented to time(Oriented to month, year, day not date) Attention: Focused;Sustained Focused Attention: Appears intact(Vigilance WNL: 1/1) Sustained Attention: Appears intact(Serial 7s: 3/3) Memory: Impaired Memory Impairment: Storage deficit;Retrieval deficit;Decreased recall of new information(Immediate: 4/5; delayed: 2/5 with cues: 2/3) Awareness: Impaired Awareness Impairment: Emergent impairment Problem Solving: Impaired Problem Solving Impairment: Verbal complex Executive Function: Reasoning;Sequencing;Organizing Reasoning:  Impaired Reasoning Impairment: Verbal complex(Abstraction: 1/2) Sequencing: Impaired  Sequencing Impairment: Verbal complex(Clock drawing: 2/3) Organizing: Appears intact(Backward digit span: 1/1) Behaviors: Impulsive       Comprehension  Auditory Comprehension Overall Auditory Comprehension: Appears within functional limits for tasks assessed Yes/No Questions: Within Functional Limits Basic Immediate Environment Questions: (5/5) Complex Questions: (5/5) Paragraph Comprehension (via yes/no questions): (3/4) Two Step Basic Commands: (4/4) Multistep Basic Commands: (3/4) Complex Commands: (Trail completion: 1/1) Conversation: Complex Environmental consultant Discrimination: Within Function Limits    Expression Expression Primary Mode of Expression: Verbal Verbal Expression Overall Verbal Expression: Appears within functional limits for tasks assessed Initiation: No impairment Automatic Speech: Counting;Day of week;Month of year(WNL) Level of Generative/Spontaneous Verbalization: Conversation Repetition: No impairment(2/2) Naming: No impairment Responsive: (5/5) Confrontation: Within functional limits(3/3) Divergent: (1/1) Written Expression Dominant Hand: Right Written Expression: (Difficulty copying cube: 0/1)   Oral / Motor  Oral Motor/Sensory Function Overall Oral Motor/Sensory Function: Within functional limits Motor Speech Overall Motor Speech: Appears within functional limits for tasks assessed Respiration: Within functional limits Phonation: Normal Resonance: Within functional limits Articulation: Within functional limitis Intelligibility: Intelligible Motor Planning: Witnin functional limits Motor Speech Errors: Not applicable   Hertha Gergen I. Hardin Negus, [place], [place] Office number [phone] Pager [place] 03/03/2019, 12:14 PM

## 2019-03-03 NOTE — Discharge Summary (Signed)
Physician Discharge Summary  Gabriel Hoover ERX:[id number] DOB: [birth year] DOA: 02/28/2019  PCP: Unk Pinto, MD  Admit date: 02/28/2019 Discharge date: 03/03/2019  Admitted From: Home Disposition:  Home  Discharge Condition:Stable CODE STATUS:FULL Diet recommendation: Heart Healthy   Brief/Interim Summary:  Patient is a 58 year old male with history of hypertension, hyperlipidemia, anxiety disorder, migraine headaches, prediabetes who presented to the emergency department with complaints of dizziness for last 3 days.  He was noted to be hypertensive on presentation.  CT head showed acute to subacute nonhemorrhagic stroke involving the left inferior cerebellum.  MRI of the brain showed left cerebellar stroke.  Neurology consulted.    Stroke work-up completed.  PT/OT recommended CIR but patient denied.  He is being discharged to home with home health today.  Following problems were addressed during his hospitalization:   MRI showed:Moderate-size confluent early subacute left PICA territory infarct involving the inferior left cerebellum. Associated edema with mild mass effect on the adjacent fourth ventricular outflow tract. No associated obstructive hydrocephalus at this time. Associated prominent petechial hemorrhage without frank hemorrhagic transformation.Additional scattered predominantly subcentimeter acute to early subacute ischemic infarcts involving the bilateral cerebral hemispheres, brainstem, and right cerebellum.  Stroke work-up done.  He was outside the window for TPA administration.  Neurology was following.  Currently  on aspirin and plavix.  PT/OT done.  He was recommended CIR but he denied so home health set up. Echocardiogram done which showed EF of more than 65%, impaired relaxation, no embolic source.  Carotid Doppler did not show significant stenosis. EKG showed normal sinus rhythm. Patient underwent TEE which did not show any ASD or PFO.  Patient denied Loop recorder  placement.  30-day event monitoring recommended, cardiology notified. Hemoglobin A1c of 10.2.  LDL of 59. Started on Lipitor 80 mg.  Hypertensive urgency: Blood pressure better today.Resume home medications.  Hyperglycemia: Hemoglobin C of 10.1.    Diabetic oriented following.  Patient denied any insulin on discharge.  He will be discharged on metformin and glipizide.  He should check his hemoglobin A1c in 3 months.  Hyperlipidemia: Continue statin  Hypokalemia: Supplemented with potassium.  Discharge Diagnoses:  Principal Problem:   Acute cerebrovascular accident (CVA) ([place]) Active Problems:   Hyperlipidemia   Hypertension   T2_NIDDM ([place])   Poor compliance with medication    Discharge Instructions  Discharge Instructions    Ambulatory referral to Neurology   Complete by: As directed    An appointment is requested in approximately: 4 weeks   Ambulatory referral to Nutrition and Diabetic Education   Complete by: As directed    Diet - low sodium heart healthy   Complete by: As directed    Discharge instructions   Complete by: As directed    1)Please follow-up with your PCP in a week. 2)Monitor your blood sugars.  Check hemoglobin A1c in 3 months.  Take prescribed medications as instructed. 3)Follow up with neurology as an outpatient in 4 weeks.  Name and number of the provider group has been attached   Increase activity slowly   Complete by: As directed      Allergies as of 03/03/2019      Reactions   Atenolol Other (See Comments)   Made tired      Medication List    TAKE these medications   amLODipine 10 MG tablet Commonly known as: NORVASC Defer starting medication until titrated up to valsartan/hctz full tab and tolerating. Then start 1/2 tab daily for 2 weeks, then full tab.  aspirin 81 MG EC tablet Take 1 tablet (81 mg total) by mouth daily.   atorvastatin 80 MG tablet Commonly known as: LIPITOR Take 1 tablet (80 mg total) by mouth daily at 6  PM. What changed:   medication strength  how much to take   clopidogrel 75 MG tablet Commonly known as: PLAVIX Take 1 tablet (75 mg total) by mouth daily for 21 days.   glipiZIDE 5 MG tablet Commonly known as: GLUCOTROL Take 0.5 tablets (2.5 mg total) by mouth 2 (two) times daily.   metFORMIN 1000 MG tablet Commonly known as: Glucophage Take 1 tablet (1,000 mg total) by mouth 2 (two) times daily with a meal.   valsartan-hydrochlorothiazide 320-25 MG tablet Commonly known as: DIOVAN-HCT Take 1 tablet by mouth daily.      Follow-up Information    Unk Pinto, MD. Schedule an appointment as soon as possible for a visit in 1 week(s).   Specialty: Internal Medicine Contact information: [address] [place] [address] [phone]        [facility]. Schedule an appointment as soon as possible for a visit in 4 week(s).   Specialty: Neurology Contact information: [place] [phone]         Allergies  Allergen Reactions  . Atenolol Other (See Comments)    Made tired    Consultations:  Neurology   Procedures/Studies: Ct Angio Head W Or Wo Contrast  Result Date: 02/17/2019 CLINICAL DATA:  Left eye vision loss. EXAM: CT ANGIOGRAPHY HEAD AND NECK TECHNIQUE: Multidetector CT imaging of the head and neck was performed using the standard protocol during bolus administration of intravenous contrast. Multiplanar CT image reconstructions and MIPs were obtained to evaluate the vascular anatomy. Carotid stenosis measurements (when applicable) are obtained utilizing NASCET criteria, using the distal internal carotid diameter as the denominator. CONTRAST:  125mL OMNIPAQUE IOHEXOL 350 MG/ML SOLN COMPARISON:  None. FINDINGS: CT HEAD FINDINGS Brain: There is no mass, hemorrhage or extra-axial collection. The size and configuration of the ventricles and extra-axial CSF spaces are  normal. There is no acute or chronic infarction. The brain parenchyma is normal. Skull: The visualized skull base, calvarium and extracranial soft tissues are normal. Sinuses/Orbits: No fluid levels or advanced mucosal thickening of the visualized paranasal sinuses. No mastoid or middle ear effusion. The orbits are normal. CTA NECK FINDINGS SKELETON: There is no bony spinal canal stenosis. No lytic or blastic lesion. OTHER NECK: Normal pharynx, larynx and major salivary glands. No cervical lymphadenopathy. Heterogeneous thyroid gland. UPPER CHEST: No pneumothorax or pleural effusion. No nodules or masses. AORTIC ARCH: There is no calcific atherosclerosis of the aortic arch. There is no aneurysm, dissection or hemodynamically significant stenosis of the visualized ascending aorta and aortic arch. Conventional 3 vessel aortic branching pattern. The visualized proximal subclavian arteries are widely patent. RIGHT CAROTID SYSTEM: --Common carotid artery: Widely patent origin without common carotid artery dissection or aneurysm. --Internal carotid artery: No dissection, occlusion or aneurysm. Mild atherosclerotic calcification at the carotid bifurcation without hemodynamically significant stenosis. --External carotid artery: No acute abnormality. LEFT CAROTID SYSTEM: --Common carotid artery: Widely patent origin without common carotid artery dissection or aneurysm. --Internal carotid artery: No dissection, occlusion or aneurysm. Mild atherosclerotic lack at the carotid bifurcation without hemodynamically significant stenosis. --External carotid artery: No acute abnormality. VERTEBRAL ARTERIES: Codominant configuration. Mild narrowing at the left vertebral artery origin. No dissection, occlusion or flow-limiting stenosis to the skull base (V1-V3 segments). CTA HEAD  FINDINGS POSTERIOR CIRCULATION: --Vertebral arteries: Normal V4 segments. --Posterior inferior cerebellar arteries (PICA): Patent origins from the vertebral  arteries. --Anterior inferior cerebellar arteries (AICA): Patent origins from the basilar artery. --Basilar artery: Normal. --Superior cerebellar arteries: Normal. --Posterior cerebral arteries (PCA): Normal. Both originate from the basilar artery. Posterior communicating arteries (p-comm) are diminutive or absent. ANTERIOR CIRCULATION: --Intracranial internal carotid arteries: Normal. --Anterior cerebral arteries (ACA): Normal. Both A1 segments are present. Patent anterior communicating artery (a-comm). --Middle cerebral arteries (MCA): Normal. VENOUS SINUSES: As permitted by contrast timing, patent. ANATOMIC VARIANTS: None Review of the MIP images confirms the above findings. IMPRESSION: 1. No emergent large vessel occlusion or hemodynamically significant stenosis by NASCET criteria. 2. Mild bilateral carotid bifurcation atherosclerosis. 3. Mild narrowing of the left vertebral artery origin. Electronically Signed   By: Ulyses Jarred M.D.   On: 02/17/2019 16:38   Ct Head Wo Contrast  Result Date: 03/01/2019 CLINICAL DATA:  Followup widespread acute infarctions EXAM: CT HEAD WITHOUT CONTRAST TECHNIQUE: Contiguous axial images were obtained from the base of the skull through the vertex without intravenous contrast. COMPARISON:  CT and MRI studies yesterday. FINDINGS: Brain: Confluent acute infarction within the inferior cerebellum on the left shows low-density and swelling without hemorrhage. Mild mass-effect upon the fourth ventricle but no ventricular obstruction. Low-density in the right frontal white matter corresponds to a acute white matter infarction. Few other scattered white matter low densities as well. No sign of new infarction, hydrocephalus or extra-axial collection. Vascular: There is atherosclerotic calcification of the major vessels at the base of the brain. Skull: Negative Sinuses/Orbits: Clear/normal Other: None IMPRESSION: Low-density and swelling within the inferior cerebellar infarction on  the left. No hemorrhagic transformation. Mass-effect upon the fourth ventricle but no ventricular obstruction. Other small acute white matter infarctions evident within the cerebral hemispheric white matter as better shown by MRI. No newly seen infarction. Electronically Signed   By: Nelson Chimes M.D.   On: 03/01/2019 12:59   Ct Head Wo Contrast  Result Date: 02/28/2019 CLINICAL DATA:  One-week history of positional vertigo. EXAM: CT HEAD WITHOUT CONTRAST TECHNIQUE: Contiguous axial images were obtained from the base of the skull through the vertex without intravenous contrast. COMPARISON:  MRI brain 02/17/2019. CTA head and neck including CT brain 02/17/2019. FINDINGS: Brain: Geographic low attenuation involving the LEFT INFERIOR cerebellum, a new finding since the prior examinations 11 days ago. Minimal mass effect. No associated hemorrhage. Ventricular system normal in size and appearance for age. No extra-axial fluid collections. No focal brain parenchymal abnormalities elsewhere. Vascular: No visible atherosclerosis. No hyperdense vessels. Skull: No skull fracture or other focal osseous abnormality involving the skull. Sinuses/Orbits: Visualized paranasal sinuses, bilateral mastoid air cells and bilateral middle ear cavities well-aerated. Benign senile calcification involving the RIGHT eye. Visualized orbits and globes otherwise normal in appearance. Other: None. IMPRESSION: 1. Acute/subacute nonhemorrhagic stroke involving the LEFT INFERIOR cerebellum. 2. Otherwise normal examination. Electronically Signed   By: Evangeline Dakin M.D.   On: 02/28/2019 18:22   Ct Angio Neck W And/or Wo Contrast  Result Date: 02/17/2019 CLINICAL DATA:  Left eye vision loss. EXAM: CT ANGIOGRAPHY HEAD AND NECK TECHNIQUE: Multidetector CT imaging of the head and neck was performed using the standard protocol during bolus administration of intravenous contrast. Multiplanar CT image reconstructions and MIPs were obtained to  evaluate the vascular anatomy. Carotid stenosis measurements (when applicable) are obtained utilizing NASCET criteria, using the distal internal carotid diameter as the denominator. CONTRAST:  138mL OMNIPAQUE IOHEXOL 350 MG/ML  SOLN COMPARISON:  None. FINDINGS: CT HEAD FINDINGS Brain: There is no mass, hemorrhage or extra-axial collection. The size and configuration of the ventricles and extra-axial CSF spaces are normal. There is no acute or chronic infarction. The brain parenchyma is normal. Skull: The visualized skull base, calvarium and extracranial soft tissues are normal. Sinuses/Orbits: No fluid levels or advanced mucosal thickening of the visualized paranasal sinuses. No mastoid or middle ear effusion. The orbits are normal. CTA NECK FINDINGS SKELETON: There is no bony spinal canal stenosis. No lytic or blastic lesion. OTHER NECK: Normal pharynx, larynx and major salivary glands. No cervical lymphadenopathy. Heterogeneous thyroid gland. UPPER CHEST: No pneumothorax or pleural effusion. No nodules or masses. AORTIC ARCH: There is no calcific atherosclerosis of the aortic arch. There is no aneurysm, dissection or hemodynamically significant stenosis of the visualized ascending aorta and aortic arch. Conventional 3 vessel aortic branching pattern. The visualized proximal subclavian arteries are widely patent. RIGHT CAROTID SYSTEM: --Common carotid artery: Widely patent origin without common carotid artery dissection or aneurysm. --Internal carotid artery: No dissection, occlusion or aneurysm. Mild atherosclerotic calcification at the carotid bifurcation without hemodynamically significant stenosis. --External carotid artery: No acute abnormality. LEFT CAROTID SYSTEM: --Common carotid artery: Widely patent origin without common carotid artery dissection or aneurysm. --Internal carotid artery: No dissection, occlusion or aneurysm. Mild atherosclerotic lack at the carotid bifurcation without hemodynamically  significant stenosis. --External carotid artery: No acute abnormality. VERTEBRAL ARTERIES: Codominant configuration. Mild narrowing at the left vertebral artery origin. No dissection, occlusion or flow-limiting stenosis to the skull base (V1-V3 segments). CTA HEAD FINDINGS POSTERIOR CIRCULATION: --Vertebral arteries: Normal V4 segments. --Posterior inferior cerebellar arteries (PICA): Patent origins from the vertebral arteries. --Anterior inferior cerebellar arteries (AICA): Patent origins from the basilar artery. --Basilar artery: Normal. --Superior cerebellar arteries: Normal. --Posterior cerebral arteries (PCA): Normal. Both originate from the basilar artery. Posterior communicating arteries (p-comm) are diminutive or absent. ANTERIOR CIRCULATION: --Intracranial internal carotid arteries: Normal. --Anterior cerebral arteries (ACA): Normal. Both A1 segments are present. Patent anterior communicating artery (a-comm). --Middle cerebral arteries (MCA): Normal. VENOUS SINUSES: As permitted by contrast timing, patent. ANATOMIC VARIANTS: None Review of the MIP images confirms the above findings. IMPRESSION: 1. No emergent large vessel occlusion or hemodynamically significant stenosis by NASCET criteria. 2. Mild bilateral carotid bifurcation atherosclerosis. 3. Mild narrowing of the left vertebral artery origin. Electronically Signed   By: Ulyses Jarred M.D.   On: 02/17/2019 16:38   Mr Angio Head Wo Contrast  Result Date: 03/01/2019 CLINICAL DATA:  Initial evaluation for EXAM: MRI HEAD WITHOUT CONTRAST MRA HEAD WITHOUT CONTRAST TECHNIQUE: Multiplanar, multiecho pulse sequences of the brain and surrounding structures were obtained without intravenous contrast. Angiographic images of the head were obtained using MRA technique without contrast. COMPARISON:  Comparison made with prior CT from earlier the same day as well as previous MRI from 02/17/2019. FINDINGS: MRI HEAD FINDINGS Brain: Generalized age-related cerebral  atrophy with mild to moderate chronic microvascular ischemic disease. Confluent restricted diffusion involving the inferior left cerebellum compatible with left PICA territory infarct, early subacute in appearance. Associated mild localized edema, with mild mass effect on the adjacent fourth ventricular outflow tract. Fourth ventricle remains patent at this time with no evidence for obstructive hydrocephalus. Associated prominent petechial hemorrhage on SWI sequence without frank hemorrhagic transformation. Multiple additional scattered predominantly subcentimeter acute to early subacute ischemic infarcts seen elsewhere throughout the bilateral cerebral hemispheres as well as the right cerebellum. Largest of these foci within the right cerebral hemisphere measures 1 cm  and is positioned within the mid right corona radiata (series 5, image 79). Largest focus within the left cerebral hemisphere involves the left periatrial white matter and measures 8 mm (series 5, image 72). Punctate 5 mm infarct within the mid right cerebellum (series 5, image 58). No made of a 6 mm ischemic infarct involving the left paramedian pons (series 5, image 65). No associated mass effect or hemorrhage about these additional infarcts. Gray-white matter differentiation otherwise maintained. Additional single chronic microhemorrhage noted within the right cerebellum. No mass lesion or midline shift. No hydrocephalus. No extra-axial fluid collection. Pituitary gland suprasellar region within normal limits. Midline structures intact. Vascular: Major intracranial vascular flow voids are maintained. Skull and upper cervical spine: Craniocervical junction within normal limits. Upper cervical spine normal. Bone marrow signal intensity within normal limits. No scalp soft tissue abnormality. Sinuses/Orbits: Globes orbital soft tissues demonstrate no acute finding. Axial myopia noted. Mild scattered mucosal thickening within the ethmoidal air cells and  maxillary sinuses. Paranasal sinuses are otherwise clear. No mastoid effusion. Inner ear structures grossly normal. Other: None. MRA HEAD FINDINGS ANTERIOR CIRCULATION: Examination mildly degraded by motion artifact. Distal cervical segments of the internal carotid arteries are patent with symmetric antegrade flow. Petrous segments patent bilaterally without flow-limiting stenosis. Scattered atheromatous irregularity throughout the cavernous/supraclinoid ICAs bilaterally. No significant stenosis on the right. There is moderate multifocal narrowing involving the cavernous/supraclinoid left ICA (up to approximately 50%). ICA termini well perfused. A1 segments patent bilaterally. Normal anterior communicating artery. Anterior cerebral arteries patent to their distal aspects without stenosis. No M1 stenosis or occlusion. Normal MCA bifurcations. Distal MCA branches well perfused and symmetric. Distal small vessel atheromatous irregularity noted. POSTERIOR CIRCULATION: Vertebral arteries patent to the vertebrobasilar junction without stenosis. Left vertebral artery slightly dominant. Right PICA patent. Left PICA not visualized, suspected to be occluded. Proximal and mid basilar artery widely patent. Moderate focal stenosis noted at the basilar tip (series 1075, image 11). Superior cerebral arteries patent bilaterally. Both of the posterior cerebral arteries primarily supplied via the basilar. PCAs demonstrate mild atheromatous irregularity but are well perfused to their distal aspects without high-grade stenosis. No intracranial aneurysm. IMPRESSION: MRI HEAD IMPRESSION: 1. 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 Associated edema with mild mass effect on the adjacent fourth ventricular outflow tract. No associated obstructive hydrocephalus at this time. Associated prominent petechial hemorrhage without frank hemorrhagic transformation. 2. Additional  scattered predominantly subcentimeter acute to early subacute ischemic infarcts involving the bilateral cerebral hemispheres, brainstem, and right cerebellum. Given the various vascular distributions involved, a central thromboembolic source is suspected. 3. Underlying mild to moderate chronic microvascular ischemic disease. MRA HEAD IMPRESSION: 1. Nonvisualization of the left PICA, suspected to be occluded given the left PICA territory infarct. 2. Moderate atherosclerotic change involving the left carotid siphon with associated moderate multifocal narrowing (approximate up to 50%). 3. Focal moderate basilar tip stenosis. 4. Additional scattered small vessel atheromatous irregularity elsewhere throughout the intracranial circulation. No other hemodynamically significant or correctable stenosis. Electronically Signed   By: Jeannine Boga M.D.   On: 03/01/2019 00:40   Mr Brain Wo Contrast  Result Date: 03/01/2019 CLINICAL DATA:  Initial evaluation for EXAM: MRI HEAD WITHOUT CONTRAST MRA HEAD WITHOUT CONTRAST TECHNIQUE: Multiplanar, multiecho pulse sequences of the brain and surrounding structures were obtained without intravenous contrast. Angiographic images of the head were obtained using MRA technique without contrast. COMPARISON:  Comparison made with prior CT from earlier the same day as well as  previous MRI from 02/17/2019. FINDINGS: MRI HEAD FINDINGS Brain: Generalized age-related cerebral atrophy with mild to moderate chronic microvascular ischemic disease. Confluent restricted diffusion involving the inferior left cerebellum compatible with left PICA territory infarct, early subacute in appearance. Associated mild localized edema, with mild mass effect on the adjacent fourth ventricular outflow tract. Fourth ventricle remains patent at this time with no evidence for obstructive hydrocephalus. Associated prominent petechial hemorrhage on SWI sequence without frank hemorrhagic transformation. Multiple  additional scattered predominantly subcentimeter acute to early subacute ischemic infarcts seen elsewhere throughout the bilateral cerebral hemispheres as well as the right cerebellum. Largest of these foci within the right cerebral hemisphere measures 1 cm and is positioned within the mid right corona radiata (series 5, image 79). Largest focus within the left cerebral hemisphere involves the left periatrial white matter and measures 8 mm (series 5, image 72). Punctate 5 mm infarct within the mid right cerebellum (series 5, image 58). No made of a 6 mm ischemic infarct involving the left paramedian pons (series 5, image 65). No associated mass effect or hemorrhage about these additional infarcts. Gray-white matter differentiation otherwise maintained. Additional single chronic microhemorrhage noted within the right cerebellum. No mass lesion or midline shift. No hydrocephalus. No extra-axial fluid collection. Pituitary gland suprasellar region within normal limits. Midline structures intact. Vascular: Major intracranial vascular flow voids are maintained. Skull and upper cervical spine: Craniocervical junction within normal limits. Upper cervical spine normal. Bone marrow signal intensity within normal limits. No scalp soft tissue abnormality. Sinuses/Orbits: Globes orbital soft tissues demonstrate no acute finding. Axial myopia noted. Mild scattered mucosal thickening within the ethmoidal air cells and maxillary sinuses. Paranasal sinuses are otherwise clear. No mastoid effusion. Inner ear structures grossly normal. Other: None. MRA HEAD FINDINGS ANTERIOR CIRCULATION: Examination mildly degraded by motion artifact. Distal cervical segments of the internal carotid arteries are patent with symmetric antegrade flow. Petrous segments patent bilaterally without flow-limiting stenosis. Scattered atheromatous irregularity throughout the cavernous/supraclinoid ICAs bilaterally. No significant stenosis on the right. There  is moderate multifocal narrowing involving the cavernous/supraclinoid left ICA (up to approximately 50%). ICA termini well perfused. A1 segments patent bilaterally. Normal anterior communicating artery. Anterior cerebral arteries patent to their distal aspects without stenosis. No M1 stenosis or occlusion. Normal MCA bifurcations. Distal MCA branches well perfused and symmetric. Distal small vessel atheromatous irregularity noted. POSTERIOR CIRCULATION: Vertebral arteries patent to the vertebrobasilar junction without stenosis. Left vertebral artery slightly dominant. Right PICA patent. Left PICA not visualized, suspected to be occluded. Proximal and mid basilar artery widely patent. Moderate focal stenosis noted at the basilar tip (series 1075, image 11). Superior cerebral arteries patent bilaterally. Both of the posterior cerebral arteries primarily supplied via the basilar. PCAs demonstrate mild atheromatous irregularity but are well perfused to their distal aspects without high-grade stenosis. No intracranial aneurysm. IMPRESSION: MRI HEAD IMPRESSION: 1. 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 Associated edema with mild mass effect on the adjacent fourth ventricular outflow tract. No associated obstructive hydrocephalus at this time. Associated prominent petechial hemorrhage without frank hemorrhagic transformation. 2. Additional scattered predominantly subcentimeter acute to early subacute ischemic infarcts involving the bilateral cerebral hemispheres, brainstem, and right cerebellum. Given the various vascular distributions involved, a central thromboembolic source is suspected. 3. Underlying mild to moderate chronic microvascular ischemic disease. MRA HEAD IMPRESSION: 1. Nonvisualization of the left PICA, suspected to be occluded given the left PICA territory infarct. 2. Moderate atherosclerotic change involving the left carotid siphon with  associated moderate multifocal narrowing (approximate up to 50%). 3. Focal moderate basilar tip stenosis. 4. Additional scattered small vessel atheromatous irregularity elsewhere throughout the intracranial circulation. No other hemodynamically significant or correctable stenosis. Electronically Signed   By: Jeannine Boga M.D.   On: 03/01/2019 00:40   Mr Brain Wo Contrast  Result Date: 02/17/2019 CLINICAL DATA:  Initial evaluation for acute left-sided visual loss. EXAM: MRI HEAD WITHOUT CONTRAST TECHNIQUE: Multiplanar, multiecho pulse sequences of the brain and surrounding structures were obtained without intravenous contrast. COMPARISON:  Prior CT and CTA from earlier the same day. FINDINGS: Brain: Cerebral volume within normal limits for patient age. Patchy T2/FLAIR hyperintensity within the periventricular and deep white matter both cerebral hemispheres most consistent with chronic small vessel ischemic disease, mild to moderate in nature. No abnormal foci of restricted diffusion to suggest acute or subacute ischemia. Gray-white matter differentiation well maintained. No encephalomalacia to suggest chronic infarction. No evidence for acute intracranial hemorrhage. Few scattered chronic micro hemorrhages noted within the right basal ganglia, right thalamus, and cerebellum, most likely related to chronic underlying hypertension. No mass lesion, midline shift or mass effect. No hydrocephalus. No extra-axial fluid collection. Major dural sinuses are grossly patent. Pituitary gland and suprasellar region are normal. Midline structures intact and normal. Vascular: Major intracranial vascular flow voids well maintained and normal in appearance. Skull and upper cervical spine: Craniocervical junction normal. Visualized upper cervical spine within normal limits. Bone marrow signal intensity normal. No scalp soft tissue abnormality. Sinuses/Orbits: Globes and orbital soft tissues demonstrate no acute finding.  Axial myopia noted. Paranasal sinuses are clear. No mastoid effusion. Inner ear structures normal. Other: None. IMPRESSION: 1. No acute intracranial infarct or other abnormality identified. 2. Underlying mild to moderate chronic microvascular ischemic disease. Otherwise unremarkable brain MRI for age. Electronically Signed   By: Jeannine Boga M.D.   On: 02/17/2019 20:16   Dg Chest Port 1 View  Result Date: 02/28/2019 CLINICAL DATA:  Dizziness for the past week. Hypertension. EXAM: PORTABLE CHEST 1 VIEW COMPARISON:  02/22/2012. FINDINGS: Interval mildly enlarged cardiac silhouette and mildly prominent pulmonary vasculature. Clear lungs. No pleural fluid. Unremarkable bones. IMPRESSION: Interval mild cardiomegaly and mild pulmonary vascular congestion. Electronically Signed   By: Claudie Revering M.D.   On: 02/28/2019 13:44   Vas [place] Transcranial Doppler W Bubbles  Result Date: 03/02/2019  Transcranial Doppler with Bubble Indications: Stroke. Performing Technologist: June Leap RDMS, RVT  Examination Guidelines: A complete evaluation includes B-mode imaging, spectral Doppler, color Doppler, and power Doppler as needed of all accessible portions of each vessel. Bilateral testing is considered an integral part of a complete examination. Limited examinations for reoccurring indications may be performed as noted.  Summary:  A vascular evaluation was performed. The right middle cerebral artery was studied. An IV was inserted into the patient's right AC fossa. Verbal informed consent was obtained.  No HITS at rest or during Valsalva. No apparent PFO. *See table(s) above for measurements and observations.    Preliminary    Vas US Carotid (at [place] Only)  Result Date: 03/02/2019 Carotid Arterial Duplex Study Indications:       CVA and Dizziness. Risk Factors:      Hypertension, hyperlipidemia. Limitations:       Hypertensive urgency Comparison Study:  No prior study on file for comparison. Performing  Technologist: Sharion Dove RVS  Examination Guidelines: A complete evaluation includes B-mode imaging, spectral Doppler, color Doppler, and power Doppler as needed of all accessible portions of each  vessel. Bilateral testing is considered an integral part of a complete examination. Limited examinations for reoccurring indications may be performed as noted.  Right Carotid Findings: +----------+--------+--------+--------+-----------+------------------+           PSV cm/sEDV cm/sStenosisDescribe   Comments           +----------+--------+--------+--------+-----------+------------------+ CCA Prox  123     23                         intimal thickening +----------+--------+--------+--------+-----------+------------------+ CCA Distal134     25                                            +----------+--------+--------+--------+-----------+------------------+ ICA Prox  108     19              homogeneous                   +----------+--------+--------+--------+-----------+------------------+ ICA Distal78      28                                            +----------+--------+--------+--------+-----------+------------------+ ECA       155     18                                            +----------+--------+--------+--------+-----------+------------------+ +----------+--------+-------+--------+-------------------+           PSV cm/sEDV cmsDescribeArm Pressure (mmHG) +----------+--------+-------+--------+-------------------+ Subclavian140                                        +----------+--------+-------+--------+-------------------+ +---------+--------+--+--------+--+ VertebralPSV cm/s94EDV cm/s26 +---------+--------+--+--------+--+  Left Carotid Findings: +----------+--------+--------+--------+-----------+------------------+           PSV cm/sEDV cm/sStenosisDescribe   Comments           +----------+--------+--------+--------+-----------+------------------+  CCA Prox  144     24                         intimal thickening +----------+--------+--------+--------+-----------+------------------+ CCA Distal144     18                         intimal thickening +----------+--------+--------+--------+-----------+------------------+ ICA Prox  94      16              homogeneous                   +----------+--------+--------+--------+-----------+------------------+ ICA Distal98      36                                            +----------+--------+--------+--------+-----------+------------------+ ECA       180     20                                            +----------+--------+--------+--------+-----------+------------------+ +----------+--------+--------+--------+-------------------+  SubclavianPSV cm/sEDV cm/sDescribeArm Pressure (mmHG) +----------+--------+--------+--------+-------------------+           139                                         +----------+--------+--------+--------+-------------------+ +---------+--------+--+--------+--+ VertebralPSV cm/s68EDV cm/s22 +---------+--------+--+--------+--+  Summary:  Left Carotid: The extracranial vessels were near-normal with only minimal wall               thickening or plaque. Vertebrals:  Bilateral vertebral arteries demonstrate antegrade flow. Subclavians: Normal flow hemodynamics were seen in bilateral subclavian              arteries. *See table(s) above for measurements and observations.  Electronically signed by Antony Contras MD on 03/02/2019 at 1:01:37 PM.    Final    Vas [place] Lower Extremity Venous (dvt)  Result Date: 03/02/2019  Lower Venous Study Indications: Stroke.  Risk Factors: None identified. Comparison Study: No prior studies. Performing Technologist: Oliver Hum RVT  Examination Guidelines: A complete evaluation includes B-mode imaging, spectral Doppler, color Doppler, and power Doppler as needed of all accessible portions of each vessel.  Bilateral testing is considered an integral part of a complete examination. Limited examinations for reoccurring indications may be performed as noted.  +---------+---------------+---------+-----------+----------+-------+ RIGHT    CompressibilityPhasicitySpontaneityPropertiesSummary +---------+---------------+---------+-----------+----------+-------+ CFV      Full           Yes      Yes                          +---------+---------------+---------+-----------+----------+-------+ SFJ      Full                                                 +---------+---------------+---------+-----------+----------+-------+ FV Prox  Full                                                 +---------+---------------+---------+-----------+----------+-------+ FV Mid   Full                                                 +---------+---------------+---------+-----------+----------+-------+ FV DistalFull                                                 +---------+---------------+---------+-----------+----------+-------+ PFV      Full                                                 +---------+---------------+---------+-----------+----------+-------+ POP      Full           Yes      Yes                          +---------+---------------+---------+-----------+----------+-------+  PTV      Full                                                 +---------+---------------+---------+-----------+----------+-------+ PERO     Full                                                 +---------+---------------+---------+-----------+----------+-------+   +---------+---------------+---------+-----------+----------+-------+ LEFT     CompressibilityPhasicitySpontaneityPropertiesSummary +---------+---------------+---------+-----------+----------+-------+ CFV      Full           Yes      Yes                          +---------+---------------+---------+-----------+----------+-------+  SFJ      Full                                                 +---------+---------------+---------+-----------+----------+-------+ FV Prox  Full                                                 +---------+---------------+---------+-----------+----------+-------+ FV Mid   Full                                                 +---------+---------------+---------+-----------+----------+-------+ FV DistalFull                                                 +---------+---------------+---------+-----------+----------+-------+ PFV      Full                                                 +---------+---------------+---------+-----------+----------+-------+ POP      Full           Yes      Yes                          +---------+---------------+---------+-----------+----------+-------+ PTV      Full                                                 +---------+---------------+---------+-----------+----------+-------+ PERO     Full                                                 +---------+---------------+---------+-----------+----------+-------+  Summary: Right: There is no evidence of deep vein thrombosis in the lower extremity. No cystic structure found in the popliteal fossa. Left: There is no evidence of deep vein thrombosis in the lower extremity. No cystic structure found in the popliteal fossa.  *See table(s) above for measurements and observations. Electronically signed by Servando Snare MD on 03/02/2019 at 3:59:02 PM.    Final        Subjective:  Patient seen and examined the bedside this morning.  Hemodynamically stable.  Denies CIR.  Home health set up.  Discharge planning explained.  Discharge Exam: Vitals:   03/03/19 0339 03/03/19 0700  BP: (!) 167/79 (!) 166/97  Pulse: 62 60  Resp: 18 18  Temp: 97.6 F (36.4 C) 98.9 F (37.2 C)  SpO2: 97% 98%   Vitals:   03/02/19 2026 03/02/19 2326 03/03/19 0339 03/03/19 0700  BP: (!) 180/103 (!) 156/88  (!) 167/79 (!) 166/97  Pulse: 69 66 62 60  Resp: 18 18 18 18   Temp: 98.5 F (36.9 C) 99.1 F (37.3 C) 97.6 F (36.4 C) 98.9 F (37.2 C)  TempSrc: Oral Oral Oral Oral  SpO2: 100% 97% 97% 98%  Weight:      Height:        General: Pt is alert, awake, not in acute distress Cardiovascular: RRR, S1/S2 +, no rubs, no gallops Respiratory: CTA bilaterally, no wheezing, no rhonchi Abdominal: Soft, NT, ND, bowel sounds + Extremities: no edema, no cyanosis    The results of significant diagnostics from this hospitalization (including imaging, microbiology, ancillary and laboratory) are listed below for reference.     Microbiology: Recent Results (from the past 240 hour(s))  SARS Coronavirus 2 (CEPHEID - Performed in [place] hospital lab), Hosp Order     Status: None   Collection Time: 02/28/19  7:25 PM   Specimen: Nasopharyngeal Swab  Result Value Ref Range Status   SARS Coronavirus 2 NEGATIVE NEGATIVE Final    Comment: (NOTE) If result is NEGATIVE SARS-CoV-2 target nucleic acids are NOT DETECTED. The SARS-CoV-2 RNA is generally detectable in upper and lower  respiratory specimens during the acute phase of infection. The lowest  concentration of SARS-CoV-2 viral copies this assay can detect is 250  copies / mL. A negative result does not preclude SARS-CoV-2 infection  and should not be used as the sole basis for treatment or other  patient management decisions.  A negative result may occur with  improper specimen collection / handling, submission of specimen other  than nasopharyngeal swab, presence of viral mutation(s) within the  areas targeted by this assay, and inadequate number of viral copies  (<250 copies / mL). A negative result must be combined with clinical  observations, patient history, and epidemiological information. If result is POSITIVE SARS-CoV-2 target nucleic acids are DETECTED. The SARS-CoV-2 RNA is generally detectable in upper and lower  respiratory  specimens dur ing the acute phase of infection.  Positive  results are indicative of active infection with SARS-CoV-2.  Clinical  correlation with patient history and other diagnostic information is  necessary to determine patient infection status.  Positive results do  not rule out bacterial infection or co-infection with other viruses. If result is PRESUMPTIVE POSTIVE SARS-CoV-2 nucleic acids MAY BE PRESENT.   A presumptive positive result was obtained on the submitted specimen  and confirmed on repeat testing.  While 2019 novel coronavirus  (SARS-CoV-2) nucleic acids may be present in the submitted sample  additional confirmatory testing may be necessary  for epidemiological  and / or clinical management purposes  to differentiate between  SARS-CoV-2 and other Sarbecovirus currently known to infect humans.  If clinically indicated additional testing with an alternate test  methodology [phone]) is advised. The SARS-CoV-2 RNA is generally  detectable in upper and lower respiratory sp ecimens during the acute  phase of infection. The expected result is Negative. Fact Sheet for Patients:  StrictlyIdeas.no Fact Sheet for Healthcare Providers: BankingDealers.co.za This test is not yet approved or cleared by the [place] FDA and has been authorized for detection and/or diagnosis of SARS-CoV-2 by FDA under an Emergency Use Authorization (EUA).  This EUA will remain in effect (meaning this test can be used) for the duration of the COVID-19 declaration under Section 564(b)(1) of the Act, 21 U.S.C. section 360bbb-3(b)(1), unless the authorization is terminated or revoked sooner. Performed at [facility], [place] [address] [place], [place] [address]      Labs: BNP (last 3 results) No results for input(s): BNP in the last 8760 hours. Basic Metabolic Panel: Recent Labs  Lab 02/28/19 1355 02/28/19 1930 02/28/19 1944 03/01/19 0536  03/02/19 0525  NA 137 134* 136 135 137  K 3.4* 3.0* 3.1* 3.0* 3.4*  CL  --  96* 97* 100 102  CO2  --  25  --  25 26  GLUCOSE  --  249* 233* 236* 197*  BUN  --  22* 22* 19 19  CREATININE  --  1.24 1.10 1.11 1.20  CALCIUM  --  8.8*  --  8.4* 8.9   Liver Function Tests: Recent Labs  Lab 02/28/19 1930 03/01/19 0536  AST 14* 15  ALT 18 17  ALKPHOS 64 64  BILITOT 1.3* 1.1  PROT 6.4* 5.8*  ALBUMIN 3.7 3.3*   No results for input(s): LIPASE, AMYLASE in the last 168 hours. No results for input(s): AMMONIA in the last 168 hours. CBC: Recent Labs  Lab 02/28/19 1355 02/28/19 1930 02/28/19 1944 03/01/19 0536  WBC  --  11.4*  --  11.1*  NEUTROABS  --  9.0*  --   --   HGB 12.9* 13.8 12.6* 12.9*  HCT 38.0* 37.3* 37.0* 35.4*  MCV  --  80.6  --  80.5  PLT  --  244  --  244   Cardiac Enzymes: No results for input(s): CKTOTAL, CKMB, CKMBINDEX, TROPONINI in the last 168 hours. BNP: Invalid input(s): POCBNP CBG: Recent Labs  Lab 03/02/19 1123 03/02/19 1318 03/02/19 1719 03/02/19 2130 03/03/19 0613  GLUCAP 182* 185* 285* 191* 197*   D-Dimer No results for input(s): DDIMER in the last 72 hours. Hgb A1c Recent Labs    03/01/19 0536  HGBA1C 10.1*   Lipid Profile Recent Labs    03/01/19 0536  CHOL 114  HDL 39*  LDLCALC 59  TRIG 80  CHOLHDL 2.9   Thyroid function studies No results for input(s): TSH, T4TOTAL, T3FREE, THYROIDAB in the last 72 hours.  Invalid input(s): FREET3 Anemia work up No results for input(s): VITAMINB12, FOLATE, FERRITIN, TIBC, IRON, RETICCTPCT in the last 72 hours. Urinalysis    Component Value Date/Time   COLORURINE YELLOW 02/28/2019 1831   APPEARANCEUR CLEAR 02/28/2019 1831   LABSPEC 1.025 02/28/2019 1831   PHURINE 6.0 02/28/2019 1831   GLUCOSEU >=500 (A) 02/28/2019 1831   HGBUR NEGATIVE 02/28/2019 1831   BILIRUBINUR NEGATIVE 02/28/2019 1831   KETONESUR 80 (A) 02/28/2019 1831   PROTEINUR 30 (A) 02/28/2019 1831   NITRITE NEGATIVE  02/28/2019 1831  LEUKOCYTESUR NEGATIVE 02/28/2019 1831   Sepsis Labs Invalid input(s): PROCALCITONIN,  WBC,  LACTICIDVEN Microbiology Recent Results (from the past 240 hour(s))  SARS Coronavirus 2 (CEPHEID - Performed in [place] hospital lab), Hosp Order     Status: None   Collection Time: 02/28/19  7:25 PM   Specimen: Nasopharyngeal Swab  Result Value Ref Range Status   SARS Coronavirus 2 NEGATIVE NEGATIVE Final    Comment: (NOTE) If result is NEGATIVE SARS-CoV-2 target nucleic acids are NOT DETECTED. The SARS-CoV-2 RNA is generally detectable in upper and lower  respiratory specimens during the acute phase of infection. The lowest  concentration of SARS-CoV-2 viral copies this assay can detect is 250  copies / mL. A negative result does not preclude SARS-CoV-2 infection  and should not be used as the sole basis for treatment or other  patient management decisions.  A negative result may occur with  improper specimen collection / handling, submission of specimen other  than nasopharyngeal swab, presence of viral mutation(s) within the  areas targeted by this assay, and inadequate number of viral copies  (<250 copies / mL). A negative result must be combined with clinical  observations, patient history, and epidemiological information. If result is POSITIVE SARS-CoV-2 target nucleic acids are DETECTED. The SARS-CoV-2 RNA is generally detectable in upper and lower  respiratory specimens dur ing the acute phase of infection.  Positive  results are indicative of active infection with SARS-CoV-2.  Clinical  correlation with patient history and other diagnostic information is  necessary to determine patient infection status.  Positive results do  not rule out bacterial infection or co-infection with other viruses. If result is PRESUMPTIVE POSTIVE SARS-CoV-2 nucleic acids MAY BE PRESENT.   A presumptive positive result was obtained on the submitted specimen  and confirmed on  repeat testing.  While 2019 novel coronavirus  (SARS-CoV-2) nucleic acids may be present in the submitted sample  additional confirmatory testing may be necessary for epidemiological  and / or clinical management purposes  to differentiate between  SARS-CoV-2 and other Sarbecovirus currently known to infect humans.  If clinically indicated additional testing with an alternate test  methodology [phone]) is advised. The SARS-CoV-2 RNA is generally  detectable in upper and lower respiratory sp ecimens during the acute  phase of infection. The expected result is Negative. Fact Sheet for Patients:  StrictlyIdeas.no Fact Sheet for Healthcare Providers: BankingDealers.co.za This test is not yet approved or cleared by the [place] FDA and has been authorized for detection and/or diagnosis of SARS-CoV-2 by FDA under an Emergency Use Authorization (EUA).  This EUA will remain in effect (meaning this test can be used) for the duration of the COVID-19 declaration under Section 564(b)(1) of the Act, 21 U.S.C. section 360bbb-3(b)(1), unless the authorization is terminated or revoked sooner. Performed at [facility], [place] [address], [place], [place] [address]     Please note: You were cared for by a hospitalist during your hospital stay. Once you are discharged, your primary care physician will handle any further medical issues. Please note that NO REFILLS for any discharge medications will be authorized once you are discharged, as it is imperative that you return to your primary care physician (or establish a relationship with a primary care physician if you do not have one) for your post hospital discharge needs so that they can reassess your need for medications and monitor your lab values.    Time coordinating discharge: 40 minutes  SIGNED:  Shelly Coss, MD  [facility] 03/03/2019, 11:14 AM Pager [phone]  If  7PM-7AM, please contact night-coverage [email or web address] Password [email or web address]

## 2019-03-03 NOTE — Progress Notes (Signed)
Nutrition Brief Note  RD consulted for diet education regarding Type 2 Diabetes Mellitus. Pt unavailable during attempted time of contact. RD has attached diet education handout "Carbohydrate Counting for People with Diabetes" from the Academy of Nutrition and Dietetics Manual to pt discharge instructions. RD contact information given. Plans for discharge home today.   Corrin Parker, MS, RD, LDN Pager # [phone] After hours/ weekend pager # [phone]

## 2019-03-03 NOTE — Plan of Care (Signed)
Adequate for discharge.

## 2019-03-03 NOTE — Progress Notes (Signed)
Inpatient Diabetes Program Recommendations  AACE/ADA: New Consensus Statement on Inpatient Glycemic Control (2015)  Target Ranges:  Prepandial:   less than 140 mg/dL      Peak postprandial:   less than 180 mg/dL (1-2 hours)      Critically ill patients:  140 - 180 mg/dL   Lab Results  Component Value Date   GLUCAP 221 (H) 03/03/2019   HGBA1C 10.1 (H) 03/01/2019    Spoke with patient again to review diabetes management.  Reviewed patient's current A1c of 10.1%. Explained what a A1c is and what it measures. Also reviewed goal A1c with patient, importance of good glucose control @ home, and blood sugar goals. Reviewed patho of DM, need for insulin, hyper vs hypo glycemia, side effects of oral antidiabetic medications, interventions for hypos, stressed the importance of being followed by PCP especially while trying to avoid injections, encouraged activity per ADA recommendations, risk of repetitive stroke with poor glycemic control, recommended frequency of CBGs daily, vascular changes and comorbidites. Reviewed importance of making lifestyle modifications, counting carbs, finding alternatives to sugary beverages and being mindful of carb intake. Patient seems motivated for agressive lifestyle modifications in an attempt to gain better control. Encouraged close follow up with PCP. Patient has no furhter questions.   Thanks, Bronson Curb, MSN, RNC-OB Diabetes Coordinator [phone] (8a-5p)

## 2019-03-03 NOTE — Progress Notes (Signed)
SLP Cancellation Note  Patient Details Name: Gabriel Hoover MRN: [id number] DOB: Jun 12, 1961   Cancelled treatment:       Reason Eval/Treat Not Completed: Patient at procedure or test/unavailable(Pt is currently with PT/OT. SLP will follow up. )  Halaina Vanduzer I. Hardin Negus, [place], [place] Office number [phone] Pager [place] 03/03/2019, 10:41 AM

## 2019-03-03 NOTE — Progress Notes (Signed)
STROKE TEAM PROGRESS NOTE       SUBJECTIVE (INTERVAL HISTORY) No one is at the bedside.  He states improvement in his dizziness.  Blood pressure is better controlled.   TCD Bubble study negative for PFO. TEE was negative as well. He is refusing loop recorder OBJECTIVE Vitals:   03/02/19 2326 03/03/19 0339 03/03/19 0700 03/03/19 1200  BP: (!) 156/88 (!) 167/79 (!) 166/97 (!) 141/123  Pulse: 66 62 60 95  Resp: 18 18 18 18   Temp: 99.1 F (37.3 C) 97.6 F (36.4 C) 98.9 F (37.2 C) 98.6 F (37 C)  TempSrc: Oral Oral Oral Oral  SpO2: 97% 97% 98% 98%  Weight:      Height:        CBC:  Recent Labs  Lab 02/28/19 1930 02/28/19 1944 03/01/19 0536  WBC 11.4*  --  11.1*  NEUTROABS 9.0*  --   --   HGB 13.8 12.6* 12.9*  HCT 37.3* 37.0* 35.4*  MCV 80.6  --  80.5  PLT 244  --  941    Basic Metabolic Panel:  Recent Labs  Lab 03/01/19 0536 03/02/19 0525  NA 135 137  K 3.0* 3.4*  CL 100 102  CO2 25 26  GLUCOSE 236* 197*  BUN 19 19  CREATININE 1.11 1.20  CALCIUM 8.4* 8.9    Lipid Panel:     Component Value Date/Time   CHOL 114 03/01/2019 0536   TRIG 80 03/01/2019 0536   HDL 39 (L) 03/01/2019 0536   CHOLHDL 2.9 03/01/2019 0536   VLDL 16 03/01/2019 0536   LDLCALC 59 03/01/2019 0536   LDLCALC 148 (H) 07/02/2018 1030   HgbA1c:  Lab Results  Component Value Date   HGBA1C 10.1 (H) 03/01/2019   Urine Drug Screen:     Component Value Date/Time   LABOPIA NONE DETECTED 02/28/2019 1831   COCAINSCRNUR NONE DETECTED 02/28/2019 1831   LABBENZ NONE DETECTED 02/28/2019 1831   AMPHETMU NONE DETECTED 02/28/2019 1831   THCU NONE DETECTED 02/28/2019 1831   LABBARB NONE DETECTED 02/28/2019 1831    Alcohol Level     Component Value Date/Time   ETH <10 02/28/2019 1930    IMAGING  Ct Head Wo Contrast 02/28/2019 IMPRESSION:  1. Acute/subacute nonhemorrhagic stroke involving the LEFT INFERIOR cerebellum.  2. Otherwise normal examination.   Gabriel Hoover Head Wo  Contrast 03/01/2019  MRI HEAD  IMPRESSION:  1. 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 Associated edema with mild mass effect on the adjacent fourth ventricular outflow tract. No associated obstructive hydrocephalus at this time. Associated prominent petechial hemorrhage without frank hemorrhagic transformation.  2. Additional scattered predominantly subcentimeter acute to early subacute ischemic infarcts involving the bilateral cerebral hemispheres, brainstem, and right cerebellum. Given the various vascular distributions involved, a central thromboembolic source is suspected.  3. Underlying mild to moderate chronic microvascular ischemic disease.   MRA HEAD  IMPRESSION:  1. Nonvisualization of the left PICA, suspected to be occluded given the left PICA territory infarct.  2. Moderate atherosclerotic change involving the left carotid siphon with associated moderate multifocal narrowing (approximate up to 50%).  3. Focal moderate basilar tip stenosis.  4. Additional scattered small vessel atheromatous irregularity elsewhere throughout the intracranial circulation. No other hemodynamically significant or correctable stenosis.    Dg Chest Port 1 View 02/28/2019 IMPRESSION:  Interval mild cardiomegaly and mild pulmonary vascular congestion.    CT Head Wo Contrast - pending    Transthoracic Echocardiogram  02/28/2019 IMPRESSIONS  1. The left ventricle has hyperdynamic systolic function, with an ejection fraction of >65%. The cavity size was normal. There is mildly increased left ventricular wall thickness. Left ventricular diastolic Doppler parameters are consistent with  impaired relaxation. Elevated mean left atrial pressure.  2. The right ventricle has normal systolic function. The cavity was normal.  3. The mitral valve is grossly normal.  4. There is mild dilatation of the aortic root measuring 38 mm.  5. The  tricuspid valve is grossly normal.  6. The aortic valve is tricuspid. No stenosis of the aortic valve.  7. Vigorous LV systolic function; mild LVH; mild diastolic dysfunction; mildly dilated aortic root.   Bilateral Carotid Dopplers  No significant stenosis bilaterally   EKG - SR rate 95 BPM. Possible old Mi. (See cardiology reading for complete details)    PHYSICAL EXAM Blood pressure (!) 141/123, pulse 95, temperature 98.6 F (37 C), temperature source Oral, resp. rate 18, height 5\' 9"  (1.753 m), weight 81.7 kg, SpO2 98 %. Pleasant middle-aged Caucasian male not in distress. . Afebrile. Head is nontraumatic. Neck is supple without bruit.    Cardiac exam no murmur or gallop. Lungs are clear to auscultation. Distal pulses are well felt. Neurological Exam ;  Awake  Alert oriented x 3. Normal speech and language.eye movements full with endgaze  Nystagmus on right lateral gaze.fundi were not visualized. Vision acuity and fields appear normal. Hearing is normal. Palatal movements are normal. Face symmetric. Tongue midline. Normal strength, tone, reflexes and coordination. Normal sensation. Gait deferred.      ASSESSMENT/PLAN Gabriel. Gabriel Hoover is a 58 y.o. male with history of Htn, Hld, DM, anxiety and admission 1 week ago for acute monocular vision loss lasting for 10 minutes diagnosed as amaurosis fugax presenting with a one wk hx of dizziness and elevated BP (SBP in the 220s)  . 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 no hydrocephalous  MRI head - Moderate-size confluent early subacute left PICA territory infarct involving the inferior left cerebellum. Associated edema with mild mass effect on the adjacent fourth ventricular outflow tract.  Additional scattered predominantly subcentimeter acute  to early subacute ischemic infarcts involving the bilateral cerebral hemispheres, brainstem, and right cerebellum.  MRA head -  Nonvisualization of the left PICA, suspected to be occluded given the left PICA territory infarct.   CTA H&N  - not ordered   Carotid Doppler - no significant stenosis  2D Echo  - EF >65%. No cardiac source of emboli identified.   Sars Corona Virus 2  - negative  LDL - 148  HgbA1c - 10.1  UDS - negative  VTE prophylaxis - lovenox  Diet  - Heart healthy with thin liquids.  aspirin 81 mg daily and clopidogrel 75 mg daily prior to admission, now on aspirin 81 mg daily and clopidogrel 75 mg daily  Patient counseled to be compliant with his antithrombotic medications  Ongoing aggressive stroke risk factor management  Therapy recommendations:  Possible CIR admission. Rehab MD consult requested  Disposition:  Pending  Hypertension  Stable . Permissive hypertension (OK if < 220/120) but gradually normalize in 5-7 days . Long-term BP goal normotensive  Hyperlipidemia  Lipid lowering medication PTA:  Lipitor 40 mg daily  LDL 148, goal < 70  Current  lipid lowering medication: Lipitor 40 mg daily (increase to 80 mg daily)  Continue statin at discharge  Diabetes  HgbA1c 10.1,  goal < 7.0  Uncontrolled  Other Stroke Risk Factors  Hx stroke/TIA  Other Active Problems  Severe needle phobia (requires sedation) limiting ability to draw blood.   Hypokalemia - 3.0 - supplemented  Mild leukocytosis  Mild anemia   Hospital day # 3    Patient refused loop recorder for  cardiac monitoring.  Continue Aspirin and Plavix .he is refusing inpt rehab and wants to go home.Long discussion with the patient and answered questions.  Discussed with Dr. Tamsen Meek. Follow up as outpt in stroke clinic in 6 weeks. Stroke team will sign off. Antony Contras, MD Medical Director [facility] Pager: [phone] 03/03/2019 1:29 PM   To contact Stroke  Continuity provider, please refer to [email or web address] After hours, contact General Neurology

## 2019-03-03 NOTE — Progress Notes (Signed)
Inpatient Rehab Admissions Coordinator:   Note pt refusing CIR in favor of home with family support and HH therapies.  He states he has spoken to his wife about this and she is in agreement.  I will sign off at let CSW know to arrange f/u.    Shann Medal, PT, DPT Admissions Coordinator [phone] 03/03/19  12:43 PM

## 2019-03-03 NOTE — Progress Notes (Signed)
  Speech Language Pathology Treatment: Cognitive-Linquistic  Patient Details Name: Gabriel Hoover MRN: [id number] DOB: 08-04-1961 Today's Date: 03/03/2019 Time: 1136-1200 SLP Time Calculation (min) (ACUTE ONLY): 24 min  Assessment / Plan / Recommendation Clinical Impression  Pt was seen for cognitive-linguistic treatment and was cooperative throughout the session. He demonstrated tangential tendencies and required cues to attend to tasks but stated, "yeah, that's just me" when the SLP pointed this out. He demonstrated 60% accuracy with recall of objective information from voice mails increasing to 100% with min-mod cues. He achieved 80% accuracy with a medication management (prescription) task increasing to 100% with min. cues. He achieved 100% accuracy with abstract reasoning and completed a working memory activity with 75% accuracy increasing to 100% with min. cues. Pt currently has discharge orders and will benefit from continued SLP services upon discharge.    HPI HPI: Pt is a 58 y.o. male with HTN and DM2 who presented to the [facility] ED with ongoing dizziness x 1 week in conjunction with HTN. He had been admitted on 7/7 for acute monocular vision loss lasting for 10 minutes, then returning over about one hour. He was diagnosed with amaurosis fugax. CT of the head revealed low-density and swelling within the inferior cerebellar infarction on the left. MRI of the brain: Nonvisualization of the left PICA, suspected to be occluded given the left PICA territory infarct.      SLP Plan  Continue with current plan of care  Patient needs continued Speech Lanaguage Pathology Services    Recommendations                   Follow up Recommendations: Inpatient Rehab;24 hour supervision/assistance SLP Visit Diagnosis: Cognitive communication deficit (R41.841) Plan: Continue with current plan of care       Alyana Kreiter I. Hardin Negus, [place], [place] Office number  [phone] Pager [place] 03/03/2019, 12:23 PM

## 2019-03-03 NOTE — Progress Notes (Signed)
Physical Therapy Treatment Patient Details Name: Gabriel Hoover MRN: [id number] DOB: 1961/03/21 Today's Date: 03/03/2019    History of Present Illness Patient is a 58 y/o male presenting with ongoing dizziness x 1 week in conjunction with HTN. PMH of DM2 and HTN. CT head revealed a subacute nonhemorrhagic stroke involving the LEFT INFERIOR cerebellum, which is relatively large in size. MRI brain: Moderate-size confluent early subacute left PICA territory infarct involving the inferior left cerebellum.     PT Comments    Patient seen for mobility progression. Good progress towards goals since initial visit. Patient noted to be hypertensive with nursing aware and administering medication - clearing PT/OT for session - patient asymptomatic.  Does continue to demonstrate deficits in balance, gait, endurance and general safety awareness with mobility requiring Min A +2 throughout session. Will continue to recommend CIR at discharge. Will continue to follow acutely.   EOB: [birth year] Standing: 171/118 Following ambulation: 191/103   Follow Up Recommendations  CIR     Equipment Recommendations  Rolling walker with 5" wheels    Recommendations for Other Services Rehab consult     Precautions / Restrictions Precautions Precautions: Fall Restrictions Weight Bearing Restrictions: No    Mobility  Bed Mobility Overal bed mobility: Needs Assistance Bed Mobility: Supine to Sit     Supine to sit: Min guard     General bed mobility comments: improvement in bed mobility, cueing for safety/positioning only  Transfers Overall transfer level: Needs assistance Equipment used: Rolling walker (2 wheeled) Transfers: Sit to/from Stand Sit to Stand: Min assist;+2 safety/equipment Stand pivot transfers: Min assist;+2 physical assistance;+2 safety/equipment       General transfer comment: Min A for steadying assist. Pt required cueing for Lt attention during  mobility  Ambulation/Gait Ambulation/Gait assistance: Min assist;+2 safety/equipment Gait Distance (Feet): 140 Feet Assistive device: Rolling walker (2 wheeled) Gait Pattern/deviations: Step-through pattern;Decreased stride length;Drifts right/left;Trunk flexed Gait velocity: decreased   General Gait Details: anterior weight shift; mild unsteadiness throughout mobility; required use of RW - cueing to keep RW with him during transfers   Stairs             Wheelchair Mobility    Modified Rankin (Stroke Patients Only) Modified Rankin (Stroke Patients Only) Pre-Morbid Rankin Score: No symptoms Modified Rankin: Moderately severe disability     Balance Overall balance assessment: Needs assistance Sitting-balance support: Bilateral upper extremity supported;Feet supported Sitting balance-Leahy Scale: Fair     Standing balance support: Bilateral upper extremity supported;During functional activity Standing balance-Leahy Scale: Poor Standing balance comment: reliant on RW and external support from PT/OT                            Cognition Arousal/Alertness: Awake/alert Behavior During Therapy: WFL for tasks assessed/performed Overall Cognitive Status: Impaired/Different from baseline Area of Impairment: Safety/judgement;Attention;Problem solving;Awareness                   Current Attention Level: Sustained   Following Commands: Follows one step commands consistently;Follows multi-step commands with increased time Safety/Judgement: Decreased awareness of safety;Decreased awareness of deficits Awareness: Emergent Problem Solving: Slow processing;Decreased initiation;Difficulty sequencing;Requires verbal cues General Comments: Improved cognition overall this session, still some cognitive deficits in higher level processing and problem solving      Exercises      General Comments General comments (skin integrity, edema, etc.): RN aware and actively  involved in BP control. See above for numbers.      Pertinent  Vitals/Pain Pain Assessment: No/denies pain Faces Pain Scale: Hurts a little bit Pain Location: Posterior headache Pain Descriptors / Indicators: Headache Pain Intervention(s): Monitored during session    Home Living     Available Help at Discharge: Family;Available 24 hours/day Type of Home: House              Prior Function            PT Goals (current goals can now be found in the care plan section) Acute Rehab PT Goals Patient Stated Goal: "get my legs stronger" PT Goal Formulation: With patient Time For Goal Achievement: 03/15/19 Potential to Achieve Goals: Good Progress towards PT goals: Progressing toward goals    Frequency    Min 4X/week      PT Plan Current plan remains appropriate    Co-evaluation PT/OT/SLP Co-Evaluation/Treatment: Yes Reason for Co-Treatment: Complexity of the patient's impairments (multi-system involvement);For patient/therapist safety;To address functional/ADL transfers PT goals addressed during session: Mobility/safety with mobility;Balance;Proper use of DME;Strengthening/ROM OT goals addressed during session: ADL's and self-care      AM-PAC PT "6 Clicks" Mobility   Outcome Measure  Help needed turning from your back to your side while in a flat bed without using bedrails?: A Little Help needed moving from lying on your back to sitting on the side of a flat bed without using bedrails?: A Little Help needed moving to and from a bed to a chair (including a wheelchair)?: A Little Help needed standing up from a chair using your arms (e.g., wheelchair or bedside chair)?: A Little Help needed to walk in hospital room?: A Little Help needed climbing 3-5 steps with a railing? : A Lot 6 Click Score: 17    End of Session Equipment Utilized During Treatment: Gait belt Activity Tolerance: Patient tolerated treatment well Patient left: in chair;with call bell/phone within  reach;with chair alarm set Nurse Communication: Mobility status PT Visit Diagnosis: Unsteadiness on feet (R26.81);Other abnormalities of gait and mobility (R26.89);Muscle weakness (generalized) (M62.81)     Time: 1031-5945 PT Time Calculation (min) (ACUTE ONLY): 25 min  Charges:  $Gait Training: 8-22 mins                      Lanney Gins, PT, DPT Supplemental Physical Therapist 03/03/19 1:11 PM Pager: [phone] Office: [phone]

## 2019-03-03 NOTE — Discharge Instructions (Signed)
Hypoglycemia °Hypoglycemia is when the sugar (glucose) level in your blood is too low. Signs of low blood sugar may include: °· Feeling: °? Hungry. °? Worried or nervous (anxious). °? Sweaty and clammy. °? Confused. °? Dizzy. °? Sleepy. °? Sick to your stomach (nauseous). °· Having: °? A fast heartbeat. °? A headache. °? A change in your vision. °? Tingling or no feeling (numbness) around your mouth, lips, or tongue. °? Jerky movements that you cannot control (seizure). °· Having trouble with: °? Moving (coordination). °? Sleeping. °? Passing out (fainting). °? Getting upset easily (irritability). °Low blood sugar can happen to people who have diabetes and people who do not have diabetes. Low blood sugar can happen quickly, and it can be an emergency. °Treating low blood sugar °Low blood sugar is often treated by eating or drinking something sugary right away, such as: °· Fruit juice, 4-6 oz (120-150 mL). °· Regular soda (not diet soda), 4-6 oz (120-150 mL). °· Low-fat milk, 4 oz (120 mL). °· Several pieces of hard candy. °· Sugar or honey, 1 Tbsp (15 mL). °Treating low blood sugar if you have diabetes °If you can think clearly and swallow safely, follow the 15:15 rule: °· Take 15 grams of a fast-acting carb (carbohydrate). Talk with your doctor about how much you should take. °· Always keep a source of fast-acting carb with you, such as: °? Sugar tablets (glucose pills). Take 3-4 pills. °? 6-8 pieces of hard candy. °? 4-6 oz (120-150 mL) of fruit juice. °? 4-6 oz (120-150 mL) of regular (not diet) soda. °? 1 Tbsp (15 mL) honey or sugar. °· Check your blood sugar 15 minutes after you take the carb. °· If your blood sugar is still at or below 70 mg/dL (3.9 mmol/L), take 15 grams of a carb again. °· If your blood sugar does not go above 70 mg/dL (3.9 mmol/L) after 3 tries, get help right away. °· After your blood sugar goes back to normal, eat a meal or a snack within 1 hour. ° °Treating very low blood sugar °If your  blood sugar is at or below 54 mg/dL (3 mmol/L), you have very low blood sugar (severe hypoglycemia). This may also cause: °· Passing out. °· Jerky movements you cannot control (seizure). °· Losing consciousness (coma). °This is an emergency. Do not wait to see if the symptoms will go away. Get medical help right away. Call your local emergency services (911 in the [place] Do not drive yourself to the hospital. °If you have very low blood sugar and you cannot eat or drink, you may need a glucagon shot (injection). A family member or friend should learn how to check your blood sugar and how to give you a glucagon shot. Ask your doctor if you need to have a glucagon shot kit at home. °Follow these instructions at home: °General instructions °· Take over-the-counter and prescription medicines only as told by your doctor. °· Stay aware of your blood sugar as told by your doctor. °· Limit alcohol intake to no more than 1 drink a day for nonpregnant women and 2 drinks a day for men. One drink equals 12 oz of beer (355 mL), 5 oz of wine (148 mL), or 1½ oz of hard liquor (44 mL). °· Keep all follow-up visits as told by your doctor. This is important. °If you have diabetes: ° °· Follow your diabetes care plan as told by your doctor. Make sure you: °? Know the signs of low blood sugar. °?   Take your medicines as told. ? Follow your exercise and meal plan. ? Eat on time. Do not skip meals. ? Check your blood sugar as often as told by your doctor. Always check it before and after exercise. ? Follow your sick day plan when you cannot eat or drink normally. Make this plan ahead of time with your doctor.  Share your diabetes care plan with: ? Your work or school. ? People you live with.  Check your pee (urine) for ketones: ? When you are sick. ? As told by your doctor.  Carry a card or wear jewelry that says you have diabetes. Contact a doctor if:  You have trouble keeping your blood sugar in your target  range.  You have low blood sugar often. Get help right away if:  You still have symptoms after you eat or drink something sugary.  Your blood sugar is at or below 54 mg/dL (3 mmol/L).  You have jerky movements that you cannot control.  You pass out. These symptoms may be an emergency. Do not wait to see if the symptoms will go away. Get medical help right away. Call your local emergency services (911 in the [place] Do not drive yourself to the hospital. Summary  Hypoglycemia happens when the level of sugar (glucose) in your blood is too low.  Low blood sugar can happen to people who have diabetes and people who do not have diabetes. Low blood sugar can happen quickly, and it can be an emergency.  Make sure you know the signs of low blood sugar and know how to treat it.  Always keep a source of sugar (fast-acting carb) with you to treat low blood sugar. This information is not intended to replace advice given to you by your health care provider. Make sure you discuss any questions you have with your health care provider. Document Released: 10/24/2009 Document Revised: 11/20/2018 Document Reviewed: 09/02/2015 Elsevier Patient Education  [place]. Hemoglobin A1c Test Why am I having this test? You may have the hemoglobin A1c test (HbA1c test) done to:  Evaluate your risk for developing diabetes (diabetes mellitus).  Diagnose diabetes.  Monitor long-term control of blood sugar (glucose) in people who have diabetes and help make treatment decisions. This test may be done with other blood glucose tests, such as fasting blood glucose and oral glucose tolerance tests. What is being tested? Hemoglobin is a type of protein in the blood that carries oxygen. Glucose attaches to hemoglobin to form glycated hemoglobin. This test checks the amount of glycated hemoglobin in your blood, which is a good indicator of the average amount of glucose in your blood during the past 2-3  months. What kind of sample is taken?  A blood sample is required for this test. It is usually collected by inserting a needle into a blood vessel. Tell a health care provider about:  All medicines you are taking, including vitamins, herbs, eye drops, creams, and over-the-counter medicines.  Any blood disorders you have.  Any surgeries you have had.  Any medical conditions you have.  Whether you are pregnant or may be pregnant. How are the results reported? Your results will be reported as a percentage that indicates how much of your hemoglobin has glucose attached to it (is glycated). Your health care provider will compare your results to normal ranges that were established after testing a large group of people (reference ranges). Reference ranges may vary among labs and hospitals. For this test, common reference ranges  are:  Adult or child without diabetes: 4-5.6%.  Adult or child with diabetes and good blood glucose control: less than 7%. What do the results mean? If you have diabetes:  A result of less than 7% is considered normal, meaning that your blood glucose is well controlled.  A result higher than 7% means that your blood glucose is not well controlled, and your treatment plan may need to be adjusted. If you do not have diabetes:  A result within the reference range is considered normal, meaning that you are not at high risk for diabetes.  A result of 5.7-6.4% means that you have a high risk of developing diabetes, and you may have prediabetes. Prediabetes is the condition of having a blood glucose level that is higher than it should be, but not high enough for you to be diagnosed with diabetes. Having prediabetes puts you at risk for developing type 2 diabetes (type 2 diabetes mellitus). You may have more tests, including a repeat HbA1c test.  Results of 6.5% or higher on two separate HbA1c tests mean that you have diabetes. You may have more tests to confirm the  diagnosis. Abnormally low HbA1c values may be caused by:  Pregnancy.  Severe blood loss.  Receiving donated blood (transfusions).  Low red blood cell count (anemia).  Long-term kidney failure.  Some unusual forms (variants) of hemoglobin. Talk with your health care provider about what your results mean. Questions to ask your health care provider Ask your health care provider, or the department that is doing the test:  When will my results be ready?  How will I get my results?  What are my treatment options?  What other tests do I need?  What are my next steps? Summary  The hemoglobin A1c test (HbA1c test) may be done to evaluate your risk for developing diabetes, to diagnose diabetes, and to monitor long-term control of blood sugar (glucose) in people who have diabetes and help make treatment decisions.  Hemoglobin is a type of protein in the blood that carries oxygen. Glucose attaches to hemoglobin to form glycated hemoglobin. This test checks the amount of glycated hemoglobin in your blood, which is a good indicator of the average amount of glucose in your blood during the past 2-3 months.  Talk with your health care provider about what your results mean. This information is not intended to replace advice given to you by your health care provider. Make sure you discuss any questions you have with your health care provider. Document Released: 08/21/2004 Document Revised: 07/12/2017 Document Reviewed: 03/12/2017 Elsevier Patient Education  [place]. Hyperglycemia Hyperglycemia occurs when the level of sugar (glucose) in the blood is too high. Glucose is a type of sugar that provides the body's main source of energy. Certain hormones (insulin and glucagon) control the level of glucose in the blood. Insulin lowers blood glucose, and glucagon increases blood glucose. Hyperglycemia can result from having too little insulin in the bloodstream, or from the body not  responding normally to insulin. Hyperglycemia occurs most often in people who have diabetes (diabetes mellitus), but it can happen in people who do not have diabetes. It can develop quickly, and it can be life-threatening if it causes you to become severely dehydrated (diabetic ketoacidosis or hyperglycemic hyperosmolar state). Severe hyperglycemia is a medical emergency. What are the causes? If you have diabetes, hyperglycemia may be caused by:  Diabetes medicine.  Medicines that increase blood glucose or affect your diabetes control.  Not eating  enough, or not eating often enough.  Changes in physical activity level.  Being sick or having an infection. If you have prediabetes or undiagnosed diabetes:  Hyperglycemia may be caused by those conditions. If you do not have diabetes, hyperglycemia may be caused by:  Certain medicines, including steroid medicines, beta-blockers, epinephrine, and thiazide diuretics.  Stress.  Serious illness.  Surgery.  Diseases of the pancreas.  Infection. What increases the risk? Hyperglycemia is more likely to develop in people who have risk factors for diabetes, such as:  Having a family member with diabetes.  Having a gene for type 1 diabetes that is passed from parent to child (inherited).  Living in an area with cold weather conditions.  Exposure to certain viruses.  Certain conditions in which the body's disease-fighting (immune) system attacks itself (autoimmune disorders).  Being overweight or obese.  Having an inactive (sedentary) lifestyle.  Having been diagnosed with insulin resistance.  Having a history of prediabetes, gestational diabetes, or polycystic ovarian syndrome (PCOS).  Being of American-Indian, African-American, Hispanic/Latino, or Asian/Pacific Islander descent. What are the signs or symptoms? Hyperglycemia may not cause any symptoms. If you do have symptoms, they may include early warning signs, such  as:  Increased thirst.  Hunger.  Feeling very tired.  Needing to urinate more often than usual.  Blurry vision. Other symptoms may develop if hyperglycemia gets worse, such as:  Dry mouth.  Loss of appetite.  Fruity-smelling breath.  Weakness.  Unexpected or rapid weight gain or weight loss.  Tingling or numbness in the hands or feet.  Headache.  Skin that does not quickly return to normal after being lightly pinched and released (poor skin turgor).  Abdominal pain.  Cuts or bruises that are slow to heal. How is this diagnosed? Hyperglycemia is diagnosed with a blood test to measure your blood glucose level. This blood test is usually done while you are having symptoms. Your health care provider may also do a physical exam and review your medical history. You may have more tests to determine the cause of your hyperglycemia, such as:  A fasting blood glucose (FBG) test. You will not be allowed to eat (you will fast) for at least 8 hours before a blood sample is taken.  An A1c (hemoglobin A1c) blood test. This provides information about blood glucose control over the previous 2-3 months.  An oral glucose tolerance test (OGTT). This measures your blood glucose at two times: ? After fasting. This is your baseline blood glucose level. ? Two hours after drinking a beverage that contains glucose. How is this treated? Treatment depends on the cause of your hyperglycemia. Treatment may include:  Taking medicine to regulate your blood glucose levels. If you take insulin or other diabetes medicines, your medicine or dosage may be adjusted.  Lifestyle changes, such as exercising more, eating healthier foods, or losing weight.  Treating an illness or infection, if this caused your hyperglycemia.  Checking your blood glucose more often.  Stopping or reducing steroid medicines, if these caused your hyperglycemia. If your hyperglycemia becomes severe and it results in  hyperglycemic hyperosmolar state, you must be hospitalized and given IV fluids. Follow these instructions at home:  General instructions  Take over-the-counter and prescription medicines only as told by your health care provider.  Do not use any products that contain nicotine or tobacco, such as cigarettes and e-cigarettes. If you need help quitting, ask your health care provider.  Limit alcohol intake to no more than 1 drink per  day for nonpregnant women and 2 drinks per day for men. One drink equals 12 oz of beer, 5 oz of wine, or 1 oz of hard liquor.  Learn to manage stress. If you need help with this, ask your health care provider.  Keep all follow-up visits as told by your health care provider. This is important. Eating and drinking   Maintain a healthy weight.  Exercise regularly, as directed by your health care provider.  Stay hydrated, especially when you exercise, get sick, or spend time in hot temperatures.  Eat healthy foods, such as: ? Lean proteins. ? Complex carbohydrates. ? Fresh fruits and vegetables. ? Low-fat dairy products. ? Healthy fats.  Drink enough fluid to keep your urine clear or pale yellow. If you have diabetes:  Make sure you know the symptoms of hyperglycemia.  Follow your diabetes management plan, as told by your health care provider. Make sure you: ? Take your insulin and medicines as directed. ? Follow your exercise plan. ? Follow your meal plan. Eat on time, and do not skip meals. ? Check your blood glucose as often as directed. Make sure to check your blood glucose before and after exercise. If you exercise longer or in a different way than usual, check your blood glucose more often. ? Follow your sick day plan whenever you cannot eat or drink normally. Make this plan in advance with your health care provider.  Share your diabetes management plan with people in your workplace, school, and household.  Check your urine for ketones when you  are ill and as told by your health care provider.  Carry a medical alert card or wear medical alert jewelry. Contact a health care provider if:  Your blood glucose is at or above 240 mg/dL (13.3 mmol/L) for 2 days in a row.  You have problems keeping your blood glucose in your target range.  You have frequent episodes of hyperglycemia. Get help right away if:  You have difficulty breathing.  You have a change in how you think, feel, or act (mental status).  You have nausea or vomiting that does not go away. These symptoms may represent a serious problem that is an emergency. Do not wait to see if the symptoms will go away. Get medical help right away. Call your local emergency services (911 in the [place] Do not drive yourself to the hospital. Summary  Hyperglycemia occurs when the level of sugar (glucose) in the blood is too high.  Hyperglycemia is diagnosed with a blood test to measure your blood glucose level. This blood test is usually done while you are having symptoms. Your health care provider may also do a physical exam and review your medical history.  If you have diabetes, follow your diabetes management plan as told by your health care provider.  Contact your health care provider if you have problems keeping your blood glucose in your target range. This information is not intended to replace advice given to you by your health care provider. Make sure you discuss any questions you have with your health care provider. Document Released: 01/23/2001 Document Revised: 04/16/2016 Document Reviewed: 04/16/2016 Elsevier Patient Education  2020 [employer]. The testing and monitoring today indicates that your blood pressure is mildly elevated, but not dangerous.  There is no sign that you are having a stroke, or heart attack.  It is important to stay on a low-salt diet, previously recommended by your doctor.  Make sure you are drinking plenty of water each  day.  Be careful when you  stand up to avoid dizziness.  Follow-up with your doctor on Monday, as scheduled. ____________________________________________________  Carbohydrate Counting for People with Diabetes  Why Is Carbohydrate Counting Important?   Counting carbohydrate servings may help you control your blood glucose level so that you feel better.   The balance between the carbohydrates you eat and insulin determines what your blood glucose level will be after eating.   Carbohydrate counting can also help you plan your meals.   Which Foods Have Carbohydrates?  Foods with carbohydrates include:   Breads, crackers, and cereals   Pasta, rice, and grains   Starchy vegetables, such as potatoes, corn, and peas   Beans and legumes   Milk, soy milk, and yogurt   Fruits and fruit juices   Sweets, such as cakes, cookies, ice cream, jam, and jelly   Carbohydrate Servings  In diabetes meal planning, 1 serving of a food with carbohydrate has about 15 grams of carbohydrate:   Check serving sizes with measuring cups and spoons or a food scale.   Read the Nutrition Facts on food labels to find out how many grams of carbohydrate are in foods you eat.   The food lists in this handout show portions that have about 15 grams of carbohydrate.   Meal Planning Tips   An Eating Plan tells you how many carbohydrate servings to eat at your meals and snacks. For many adults, eating 3 to 5 servings of carbohydrate foods at each meal and 1 or 2 carbohydrate servings for each snack works well.   In a healthy daily Eating Plan, most carbohydrates come from:  o At least 6 servings of fruits and nonstarchy vegetables  o At least 6 servings of grains, beans, and starchy vegetables, with at least 3 servings from whole grains  o At least 2 servings of milk or milk products   Check your blood glucose level regularly. It can tell you if you need to adjust when you eat carbohydrates.   Eating foods that have fiber, such as whole  grains, and having very few salty foods is good for your health.   Eat 4 to 6 ounces of meat or other protein foods (such as soybean burgers) each day. Choose low-fat sources of protein, such as lean beef, lean pork, chicken, fish, low-fat cheese, or vegetarian foods such as soy.   Eat some healthy fats, such as olive oil, canola oil, and nuts.   Eat very little saturated fats. These unhealthy fats are found in butter, cream, and high-fat meats, such as bacon and sausage.   Eat very little or no trans fats. These unhealthy fats are found in all foods that list partially hydrogenated oil as an ingredient.   Label Reading Tips  The Nutrition Facts panel on a label lists the grams of total carbohydrate in 1 standard serving. The labels standard serving may be larger or smaller than 1 carbohydrate serving.  To figure out how many carbohydrate servings are in the food:   First, look at the labels standard serving size.   Check the grams of total carbohydrate. This is the amount of carbohydrate in 1 standard serving.   Divide the grams of total carbohydrate by 15. This number equals the number of carbohydrate servings in 1 standard serving. Remember: 1 carbohydrate serving is 15 grams of carbohydrate.   Note: You may ignore the grams of sugars on the Nutrition Facts panel because they are included in the grams  of total carbohydrate.   Food Lists for Carbohydrate Counting  1 serving = about 15 grams of carbohydrate  Starches   1 slice bread (1 ounce)   1 tortilla (6-inch size)    large bagel (1 ounce)   2 taco shells (5-inch size)    hamburger or hot dog bun ( ounce)    cup ready-to-eat unsweetened cereal    cup cooked cereal   1 cup broth-based soup   4 to 6 small crackers   ? cup pasta or rice (cooked)    cup beans, peas, corn, sweet potatoes, winter squash, or mashed or boiled potatoes (cooked)    large baked potato (3 ounces)    ounce pretzels, potato chips,  or tortilla chips   3 cups popcorn (popped)   Sweets and Desserts   2-inch square cake (unfrosted)   2 small cookies (? ounce)    cup ice cream or frozen yogurt    cup sherbet or sorbet   1 tablespoon syrup, jam, jelly, table sugar, or honey   2 tablespoons light syrup   Milk   1 cup fat-free or reduced-fat milk   1 cup soy milk   ? cup (6 ounces) nonfat yogurt sweetened with sugar-free sweetener   Fruit   1 small fresh fruit ( to 1 cup)    cup canned or frozen fruit   2 tablespoons dried fruit (blueberries, cherries, cranberries, mixed fruit, raisins)   17 small grapes (3 ounces)   1 cup melon or berries    cup unsweetened fruit juice   Other Foods   Count 1 cup raw vegetables or  cup cooked nonstarchy vegetables as zero (0) carbohydrate servings or free foods. If you eat 3 or more servings at one meal, count them as 1 carbohydrate serving.   Foods that have less than 20 calories in each serving also may be counted as zero carbohydrate servings or free foods.   Count 1 cup of casserole or other mixed foods as 2 carbohydrate servings.   Carbohydrate Counting for People with Diabetes Sample 1-Day Menu Menu   Breakfast  1 extra-small banana (1 carbohydrate serving)   cup corn flakes (1 carbohydrate serving) with 1 cup low-fat or fat-free milk (1 carbohydrate serving)  1 slice whole wheat bread (1 carbohydrate serving) with 1 teaspoon margarine    Lunch   2 ounces [place] slices with 2 lettuce leaves on 2 slices whole wheat bread (2 carbohydrate servings)  4 celery sticks  4 carrot sticks  1 medium apple (1 carbohydrate serving)  1 cup low-fat or fat-free milk (1 carbohydrate serving)    Afternoon Snack   2 tablespoons raisins (1 carbohydrate serving)   ounce unsalted mini pretzels (1 carbohydrate serving)    Evening Meal   3 ounces lean roast beef  1/2 large baked potato (2 carbohydrate servings) with 1 tablespoon reduced-fat sour cream  1/2  cup green beans  1 cup vegetable salad with 1 tablespoon light salad dressing  1 whole wheat dinner roll (1 carbohydrate serving) with 1 teaspoon margarine  1 cup melon balls (1 carbohydrate serving)    Evening Snack   6 ounces low-fat sugar-free fruit yogurt (1 carbohydrate serving)  2 tablespoons unsalted nuts    Corrin Parker, MS, RD, LDN Clinical Dietitian Office phone [phone]

## 2019-03-03 NOTE — Progress Notes (Addendum)
Occupational Therapy Treatment Patient Details Name: Gabriel Hoover MRN: [id number] DOB: 1961-07-24 Today's Date: 03/03/2019    History of present illness Patient is a 58 y/o male presenting with ongoing dizziness x 1 week in conjunction with HTN. PMH of DM2 and HTN. CT head revealed a subacute nonhemorrhagic stroke involving the LEFT INFERIOR cerebellum, which is relatively large in size. MRI brain: Moderate-size confluent early subacute left PICA territory infarct involving the inferior left cerebellum.    OT comments  Pt demonstrated good improvement this session, making gains toward his OT POC. Pt is still requiring min A for functional mobility and ADLs, with +2 assistance necessary for line management and safety. Pt very hypertensive this session with readings below, RN aware, approved session after medication was administered. Pt requires cueing with L attention/awareness and for RW management. Pt remains appropriate for CIR placement. OT will continue to follow acutely.   EOB: [birth year] Standing: 171/118 Following ambulation: 191/103   Follow Up Recommendations  CIR;Supervision/Assistance - 24 hour    Equipment Recommendations  None recommended by OT       Precautions / Restrictions Precautions Precautions: Fall Restrictions Weight Bearing Restrictions: No       Mobility Bed Mobility Overal bed mobility: Needs Assistance Bed Mobility: Supine to Sit     Supine to sit: Min guard     General bed mobility comments: improvement in bed mobility, cueing for safety/positioning only  Transfers Overall transfer level: Needs assistance Equipment used: Rolling walker (2 wheeled) Transfers: Sit to/from Stand Sit to Stand: Min assist;+2 safety/equipment         General transfer comment: Min A for steadying assist. Pt required cueing for Lt attention during mobility    Balance Overall balance assessment: Needs assistance Sitting-balance support: Bilateral upper extremity  supported;Feet supported Sitting balance-Leahy Scale: Fair     Standing balance support: Bilateral upper extremity supported;During functional activity Standing balance-Leahy Scale: Poor Standing balance comment: reliant on RW and external support from PT/OT                           ADL either performed or assessed with clinical judgement   ADL Overall ADL's : Needs assistance/impaired     Grooming: Oral care;Wash/dry hands;Standing;Cueing for safety;Minimal assistance           Upper Body Dressing : Min guard;Sitting                   Functional mobility during ADLs: Minimal assistance;Rolling walker;Cueing for safety General ADL Comments: +2 required for line management and safety 2/2 fall risk and hypertensive readings               Cognition Arousal/Alertness: Awake/alert Behavior During Therapy: WFL for tasks assessed/performed Overall Cognitive Status: Impaired/Different from baseline Area of Impairment: Safety/judgement;Attention;Problem solving;Awareness                   Current Attention Level: Sustained   Following Commands: Follows one step commands consistently;Follows multi-step commands with increased time Safety/Judgement: Decreased awareness of safety;Decreased awareness of deficits Awareness: Emergent Problem Solving: Slow processing;Decreased initiation;Difficulty sequencing;Requires verbal cues General Comments: Improved cognition overall this session, still some cognitive deficits in higher level processing and problem solving              General Comments RN aware and actively involved in BP control. See above for numbers.     Pertinent Vitals/ Pain       Pain Assessment:  No/denies pain Faces Pain Scale: Hurts a little bit Pain Location: Posterior headache Pain Descriptors / Indicators: Headache Pain Intervention(s): Monitored during session     Prior Functioning/Environment              Frequency  Min  2X/week        Progress Toward Goals  OT Goals(current goals can now be found in the care plan section)  Progress towards OT goals: Progressing toward goals  Acute Rehab OT Goals Patient Stated Goal: "get my legs stronger" OT Goal Formulation: With patient Time For Goal Achievement: 03/15/19 Potential to Achieve Goals: Good  Plan Discharge plan remains appropriate    Co-evaluation    PT/OT/SLP Co-Evaluation/Treatment: Yes Reason for Co-Treatment: Complexity of the patient's impairments (multi-system involvement);For patient/therapist safety;To address functional/ADL transfers   OT goals addressed during session: ADL's and self-care      AM-PAC OT "6 Clicks" Daily Activity     Outcome Measure   Help from another person eating meals?: None Help from another person taking care of personal grooming?: A Little Help from another person toileting, which includes using toliet, bedpan, or urinal?: A Little Help from another person bathing (including washing, rinsing, drying)?: A Little Help from another person to put on and taking off regular upper body clothing?: A Little Help from another person to put on and taking off regular lower body clothing?: A Little 6 Click Score: 19    End of Session Equipment Utilized During Treatment: Gait belt;Rolling walker  OT Visit Diagnosis: Unsteadiness on feet (R26.81);Other symptoms and signs involving cognitive function;Other symptoms and signs involving the nervous system (R29.898)   Activity Tolerance Patient tolerated treatment well   Patient Left in chair;with call bell/phone within reach;with chair alarm set   Nurse Communication Mobility status;Other (comment)(BP readings)        Time: 1026-1050 OT Time Calculation (min): 24 min  Charges: OT General Charges $OT Visit: 1 Visit OT Treatments $Self Care/Home Management : 8-22 mins   Curtis Sites OTR/L  03/03/2019, 11:33 AM

## 2019-03-03 NOTE — TOC Transition Note (Signed)
Transition of Care [facility]) - CM/SW Discharge Note   Patient Details  Name: Gabriel Hoover MRN: [id number] Date of Birth: 19-Apr-1961  [facility] CM/SW Contact:  Geralynn Ochs, LCSW Phone Number: 03/03/2019, 4:44 PM   Clinical Narrative:   Patient discharging home. Recommendation for home health; however, CSW made calls to locate home health agency and no one accepted referral. CSW discussed with patient, and patient agreeable to pursue outpatient therapy. Preference for [facility] in [place], as it is close to his hosue and his wife already goes there. CSW to reach out tomorrow to send orders.     Final next level of care: OP Rehab Barriers to Discharge: Barriers Resolved   Patient Goals and CMS Choice        Discharge Placement                       Discharge Plan and Services                                     Social Determinants of Health (SDOH) Interventions     Readmission Risk Interventions No flowsheet data found.

## 2019-03-04 ENCOUNTER — Telehealth: Payer: Self-pay | Admitting: *Deleted

## 2019-03-04 NOTE — Telephone Encounter (Signed)
Called patient on 03/04/2019 , 10:27 AM in an attempt to reach the patient for a hospital follow up.   Admit date: 02/28/19 Discharge: 03/03/19   He does not have any questions or concerns about medications from the hospital admission. The patient's medications were reviewed over the phone, they were counseled to bring in all current medications to the hospital follow up visit.   I advised the patient to call if any questions or concerns arise about the hospital admission or medications    Home health was not started in the hospital. Patient is planning to go to out-patient physical therapy, but is still waiting for them to contact him. All questions were answered and a follow up appointment was made. Patient has an appointment 03/02/2019 with Irving Shows.  Prior to Admission medications   Medication Sig Start Date End Date Taking? Authorizing Provider  amLODipine (NORVASC) 10 MG tablet Take 1 tablet (10 mg total) by mouth daily. 03/03/19   Shelly Coss, MD  aspirin EC 81 MG EC tablet Take 1 tablet (81 mg total) by mouth daily. 02/19/19   Lars Mage, MD  atorvastatin (LIPITOR) 80 MG tablet Take 1 tablet (80 mg total) by mouth daily at 6 PM. 03/03/19   Shelly Coss, MD  clopidogrel (PLAVIX) 75 MG tablet Take 1 tablet (75 mg total) by mouth daily for 21 days. 02/19/19 03/12/19  Chundi, Verne Spurr, MD  glipiZIDE (GLUCOTROL) 5 MG tablet Take 0.5 tablets (2.5 mg total) by mouth 2 (two) times daily. 03/03/19 04/02/19  Shelly Coss, MD  metFORMIN (GLUCOPHAGE) 1000 MG tablet Take 1 tablet (1,000 mg total) by mouth 2 (two) times daily with a meal. 03/03/19 04/02/19  Shelly Coss, MD  valsartan-hydrochlorothiazide (DIOVAN-HCT) 320-25 MG tablet Take 1 tablet by mouth daily. 02/18/19 03/20/19  Lars Mage, MD

## 2019-03-06 DIAGNOSIS — R42 Dizziness and giddiness: Secondary | ICD-10-CM | POA: Diagnosis not present

## 2019-03-06 DIAGNOSIS — M6281 Muscle weakness (generalized): Secondary | ICD-10-CM | POA: Diagnosis not present

## 2019-03-06 DIAGNOSIS — I639 Cerebral infarction, unspecified: Secondary | ICD-10-CM | POA: Diagnosis not present

## 2019-03-09 DIAGNOSIS — M6281 Muscle weakness (generalized): Secondary | ICD-10-CM | POA: Diagnosis not present

## 2019-03-09 DIAGNOSIS — R42 Dizziness and giddiness: Secondary | ICD-10-CM | POA: Diagnosis not present

## 2019-03-09 DIAGNOSIS — I639 Cerebral infarction, unspecified: Secondary | ICD-10-CM | POA: Diagnosis not present

## 2019-03-09 NOTE — Progress Notes (Deleted)
Hospital follow up  Assessment and Plan: Hospital visit follow up for ***:   All medications were reviewed with patient and family and fully reconciled. All questions answered fully, and patient and family members were encouraged to call the office with any further questions or concerns. Discussed goal to avoid readmission related to this diagnosis.  There are no discontinued medications.  CAN NOT DO FOR [employer]BS REGULAR OR MEDICARE Over 40 minutes of exam, counseling, chart review, and complex, high/moderate level critical decision making was performed this visit.   Future Appointments  Date Time Provider [place]  03/10/2019 12:00 PM Vicie Mutters, PA-C [facility] None  04/16/2019  8:15 AM Venancio Poisson, NP [facility] None     HPI 58 y.o.male presents for follow up for transition from recent hospitalization or [facility]IF stay. Admit date to the hospital was 02/28/19, patient was discharged from the hospital on 03/03/19 and our clinical staff contacted the office the day after discharge to set up a follow up appointment. The discharge summary, medications, and diagnostic test results were reviewed before meeting with the patient. The patient was admitted for:  Patient had hypertension and dizziness, found to have acute to subacute nonhemorrhagic stroke involving the left inferior cerebellum on CT. MRI of the brain showed left cerebellar stroke. Neurology consulted, has follow up.  He was outside window for TPA, on ASA and plavix. He declines home health set up *** He had an echo that was normal EF 16%, no embolic source, carotid dopplers were negative. TEE was negative for ASD/PFO.  He declines loop recorder placement despite medical recommendation of 30 day event monitoring.   He was started on lipitor 80 Declined insulin for his DM.  FarxigaJardince?   Lab Results  Component Value Date   HGBA1C 10.1 (H) 03/01/2019   Lab Results  Component Value Date   CHOL 114 03/01/2019   HDL  39 (L) 03/01/2019   LDLCALC 59 03/01/2019   TRIG 80 03/01/2019   CHOLHDL 2.9 03/01/2019   Lab Results  Component Value Date   WBC 11.1 (H) 03/01/2019   HGB 12.9 (L) 03/01/2019   HCT 35.4 (L) 03/01/2019   MCV 80.5 03/01/2019   PLT 244 03/01/2019    MRI showed:Moderate-size confluent early subacute left PICA territory infarct involving the inferior left cerebellum. Associated edema with mild mass effect on the adjacent fourth ventricular outflow tract. No associated obstructive hydrocephalus at this time. Associated prominent petechial hemorrhage without frank hemorrhagic transformation.Additional scattered predominantly subcentimeter acute to early subacute ischemic infarcts involving the bilateral cerebral hemispheres, brainstem, and right cerebellum.   Images while in the hospital: Ct Head Wo Contrast  Result Date: 03/01/2019 CLINICAL DATA:  Followup widespread acute infarctions EXAM: CT HEAD WITHOUT CONTRAST TECHNIQUE: Contiguous axial images were obtained from the base of the skull through the vertex without intravenous contrast. COMPARISON:  CT and MRI studies yesterday. FINDINGS: Brain: Confluent acute infarction within the inferior cerebellum on the left shows low-density and swelling without hemorrhage. Mild mass-effect upon the fourth ventricle but no ventricular obstruction. Low-density in the right frontal white matter corresponds to a acute white matter infarction. Few other scattered white matter low densities as well. No sign of new infarction, hydrocephalus or extra-axial collection. Vascular: There is atherosclerotic calcification of the major vessels at the base of the brain. Skull: Negative Sinuses/Orbits: Clear/normal Other: None IMPRESSION: Low-density and swelling within the inferior cerebellar infarction on the left. No hemorrhagic transformation. Mass-effect upon the fourth ventricle but no ventricular obstruction. Other small  acute white matter infarctions evident  within the cerebral hemispheric white matter as better shown by MRI. No newly seen infarction. Electronically Signed   By: Nelson Chimes M.D.   On: 03/01/2019 12:59   Ct Head Wo Contrast  Result Date: 02/28/2019 CLINICAL DATA:  One-week history of positional vertigo. EXAM: CT HEAD WITHOUT CONTRAST TECHNIQUE: Contiguous axial images were obtained from the base of the skull through the vertex without intravenous contrast. COMPARISON:  MRI brain 02/17/2019. CTA head and neck including CT brain 02/17/2019. FINDINGS: Brain: Geographic low attenuation involving the LEFT INFERIOR cerebellum, a new finding since the prior examinations 11 days ago. Minimal mass effect. No associated hemorrhage. Ventricular system normal in size and appearance for age. No extra-axial fluid collections. No focal brain parenchymal abnormalities elsewhere. Vascular: No visible atherosclerosis. No hyperdense vessels. Skull: No skull fracture or other focal osseous abnormality involving the skull. Sinuses/Orbits: Visualized paranasal sinuses, bilateral mastoid air cells and bilateral middle ear cavities well-aerated. Benign senile calcification involving the RIGHT eye. Visualized orbits and globes otherwise normal in appearance. Other: None. IMPRESSION: 1. Acute/subacute nonhemorrhagic stroke involving the LEFT INFERIOR cerebellum. 2. Otherwise normal examination. Electronically Signed   By: Evangeline Dakin M.D.   On: 02/28/2019 18:22   Mr Angio Head Wo Contrast  Result Date: 03/01/2019 CLINICAL DATA:  Initial evaluation for EXAM: MRI HEAD WITHOUT CONTRAST MRA HEAD WITHOUT CONTRAST TECHNIQUE: Multiplanar, multiecho pulse sequences of the brain and surrounding structures were obtained without intravenous contrast. Angiographic images of the head were obtained using MRA technique without contrast. COMPARISON:  Comparison made with prior CT from earlier the same day as well as previous MRI from 02/17/2019. FINDINGS: MRI HEAD FINDINGS Brain:  Generalized age-related cerebral atrophy with mild to moderate chronic microvascular ischemic disease. Confluent restricted diffusion involving the inferior left cerebellum compatible with left PICA territory infarct, early subacute in appearance. Associated mild localized edema, with mild mass effect on the adjacent fourth ventricular outflow tract. Fourth ventricle remains patent at this time with no evidence for obstructive hydrocephalus. Associated prominent petechial hemorrhage on SWI sequence without frank hemorrhagic transformation. Multiple additional scattered predominantly subcentimeter acute to early subacute ischemic infarcts seen elsewhere throughout the bilateral cerebral hemispheres as well as the right cerebellum. Largest of these foci within the right cerebral hemisphere measures 1 cm and is positioned within the mid right corona radiata (series 5, image 79). Largest focus within the left cerebral hemisphere involves the left periatrial white matter and measures 8 mm (series 5, image 72). Punctate 5 mm infarct within the mid right cerebellum (series 5, image 58). No made of a 6 mm ischemic infarct involving the left paramedian pons (series 5, image 65). No associated mass effect or hemorrhage about these additional infarcts. Gray-white matter differentiation otherwise maintained. Additional single chronic microhemorrhage noted within the right cerebellum. No mass lesion or midline shift. No hydrocephalus. No extra-axial fluid collection. Pituitary gland suprasellar region within normal limits. Midline structures intact. Vascular: Major intracranial vascular flow voids are maintained. Skull and upper cervical spine: Craniocervical junction within normal limits. Upper cervical spine normal. Bone marrow signal intensity within normal limits. No scalp soft tissue abnormality. Sinuses/Orbits: Globes orbital soft tissues demonstrate no acute finding. Axial myopia noted. Mild scattered mucosal thickening  within the ethmoidal air cells and maxillary sinuses. Paranasal sinuses are otherwise clear. No mastoid effusion. Inner ear structures grossly normal. Other: None. MRA HEAD FINDINGS ANTERIOR CIRCULATION: Examination mildly degraded by motion artifact. Distal cervical segments of the internal carotid arteries are  patent with symmetric antegrade flow. Petrous segments patent bilaterally without flow-limiting stenosis. Scattered atheromatous irregularity throughout the cavernous/supraclinoid ICAs bilaterally. No significant stenosis on the right. There is moderate multifocal narrowing involving the cavernous/supraclinoid left ICA (up to approximately 50%). ICA termini well perfused. A1 segments patent bilaterally. Normal anterior communicating artery. Anterior cerebral arteries patent to their distal aspects without stenosis. No M1 stenosis or occlusion. Normal MCA bifurcations. Distal MCA branches well perfused and symmetric. Distal small vessel atheromatous irregularity noted. POSTERIOR CIRCULATION: Vertebral arteries patent to the vertebrobasilar junction without stenosis. Left vertebral artery slightly dominant. Right PICA patent. Left PICA not visualized, suspected to be occluded. Proximal and mid basilar artery widely patent. Moderate focal stenosis noted at the basilar tip (series 1075, image 11). Superior cerebral arteries patent bilaterally. Both of the posterior cerebral arteries primarily supplied via the basilar. PCAs demonstrate mild atheromatous irregularity but are well perfused to their distal aspects without high-grade stenosis. No intracranial aneurysm. IMPRESSION: MRI HEAD IMPRESSION: 1. 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 Associated edema with mild mass effect on the adjacent fourth ventricular outflow tract. No associated obstructive hydrocephalus at this time. Associated prominent petechial hemorrhage without frank hemorrhagic  transformation. 2. Additional scattered predominantly subcentimeter acute to early subacute ischemic infarcts involving the bilateral cerebral hemispheres, brainstem, and right cerebellum. Given the various vascular distributions involved, a central thromboembolic source is suspected. 3. Underlying mild to moderate chronic microvascular ischemic disease. MRA HEAD IMPRESSION: 1. Nonvisualization of the left PICA, suspected to be occluded given the left PICA territory infarct. 2. Moderate atherosclerotic change involving the left carotid siphon with associated moderate multifocal narrowing (approximate up to 50%). 3. Focal moderate basilar tip stenosis. 4. Additional scattered small vessel atheromatous irregularity elsewhere throughout the intracranial circulation. No other hemodynamically significant or correctable stenosis. Electronically Signed   By: Jeannine Boga M.D.   On: 03/01/2019 00:40   Mr Brain Wo Contrast  Result Date: 03/01/2019 CLINICAL DATA:  Initial evaluation for EXAM: MRI HEAD WITHOUT CONTRAST MRA HEAD WITHOUT CONTRAST TECHNIQUE: Multiplanar, multiecho pulse sequences of the brain and surrounding structures were obtained without intravenous contrast. Angiographic images of the head were obtained using MRA technique without contrast. COMPARISON:  Comparison made with prior CT from earlier the same day as well as previous MRI from 02/17/2019. FINDINGS: MRI HEAD FINDINGS Brain: Generalized age-related cerebral atrophy with mild to moderate chronic microvascular ischemic disease. Confluent restricted diffusion involving the inferior left cerebellum compatible with left PICA territory infarct, early subacute in appearance. Associated mild localized edema, with mild mass effect on the adjacent fourth ventricular outflow tract. Fourth ventricle remains patent at this time with no evidence for obstructive hydrocephalus. Associated prominent petechial hemorrhage on SWI sequence without frank  hemorrhagic transformation. Multiple additional scattered predominantly subcentimeter acute to early subacute ischemic infarcts seen elsewhere throughout the bilateral cerebral hemispheres as well as the right cerebellum. Largest of these foci within the right cerebral hemisphere measures 1 cm and is positioned within the mid right corona radiata (series 5, image 79). Largest focus within the left cerebral hemisphere involves the left periatrial white matter and measures 8 mm (series 5, image 72). Punctate 5 mm infarct within the mid right cerebellum (series 5, image 58). No made of a 6 mm ischemic infarct involving the left paramedian pons (series 5, image 65). No associated mass effect or hemorrhage about these additional infarcts. Gray-white matter differentiation otherwise maintained. Additional single chronic microhemorrhage noted within the right cerebellum. No  mass lesion or midline shift. No hydrocephalus. No extra-axial fluid collection. Pituitary gland suprasellar region within normal limits. Midline structures intact. Vascular: Major intracranial vascular flow voids are maintained. Skull and upper cervical spine: Craniocervical junction within normal limits. Upper cervical spine normal. Bone marrow signal intensity within normal limits. No scalp soft tissue abnormality. Sinuses/Orbits: Globes orbital soft tissues demonstrate no acute finding. Axial myopia noted. Mild scattered mucosal thickening within the ethmoidal air cells and maxillary sinuses. Paranasal sinuses are otherwise clear. No mastoid effusion. Inner ear structures grossly normal. Other: None. MRA HEAD FINDINGS ANTERIOR CIRCULATION: Examination mildly degraded by motion artifact. Distal cervical segments of the internal carotid arteries are patent with symmetric antegrade flow. Petrous segments patent bilaterally without flow-limiting stenosis. Scattered atheromatous irregularity throughout the cavernous/supraclinoid ICAs bilaterally. No  significant stenosis on the right. There is moderate multifocal narrowing involving the cavernous/supraclinoid left ICA (up to approximately 50%). ICA termini well perfused. A1 segments patent bilaterally. Normal anterior communicating artery. Anterior cerebral arteries patent to their distal aspects without stenosis. No M1 stenosis or occlusion. Normal MCA bifurcations. Distal MCA branches well perfused and symmetric. Distal small vessel atheromatous irregularity noted. POSTERIOR CIRCULATION: Vertebral arteries patent to the vertebrobasilar junction without stenosis. Left vertebral artery slightly dominant. Right PICA patent. Left PICA not visualized, suspected to be occluded. Proximal and mid basilar artery widely patent. Moderate focal stenosis noted at the basilar tip (series 1075, image 11). Superior cerebral arteries patent bilaterally. Both of the posterior cerebral arteries primarily supplied via the basilar. PCAs demonstrate mild atheromatous irregularity but are well perfused to their distal aspects without high-grade stenosis. No intracranial aneurysm. IMPRESSION: MRI HEAD IMPRESSION: 1. 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 Associated edema with mild mass effect on the adjacent fourth ventricular outflow tract. No associated obstructive hydrocephalus at this time. Associated prominent petechial hemorrhage without frank hemorrhagic transformation. 2. Additional scattered predominantly subcentimeter acute to early subacute ischemic infarcts involving the bilateral cerebral hemispheres, brainstem, and right cerebellum. Given the various vascular distributions involved, a central thromboembolic source is suspected. 3. Underlying mild to moderate chronic microvascular ischemic disease. MRA HEAD IMPRESSION: 1. Nonvisualization of the left PICA, suspected to be occluded given the left PICA territory infarct. 2. Moderate atherosclerotic change  involving the left carotid siphon with associated moderate multifocal narrowing (approximate up to 50%). 3. Focal moderate basilar tip stenosis. 4. Additional scattered small vessel atheromatous irregularity elsewhere throughout the intracranial circulation. No other hemodynamically significant or correctable stenosis. Electronically Signed   By: Jeannine Boga M.D.   On: 03/01/2019 00:40   Dg Chest Port 1 View  Result Date: 02/28/2019 CLINICAL DATA:  Dizziness for the past week. Hypertension. EXAM: PORTABLE CHEST 1 VIEW COMPARISON:  02/22/2012. FINDINGS: Interval mildly enlarged cardiac silhouette and mildly prominent pulmonary vasculature. Clear lungs. No pleural fluid. Unremarkable bones. IMPRESSION: Interval mild cardiomegaly and mild pulmonary vascular congestion. Electronically Signed   By: Claudie Revering M.D.   On: 02/28/2019 13:44   Vas US Carotid (at [facility] Only)  Result Date: 03/02/2019 Carotid Arterial Duplex Study Indications:       CVA and Dizziness. Risk Factors:      Hypertension, hyperlipidemia. Limitations:       Hypertensive urgency Comparison Study:  No prior study on file for comparison. Performing Technologist: Sharion Dove RVS  Examination Guidelines: A complete evaluation includes B-mode imaging, spectral Doppler, color Doppler, and power Doppler as needed of all accessible portions of each vessel. Bilateral testing  is considered an integral part of a complete examination. Limited examinations for reoccurring indications may be performed as noted.  Right Carotid Findings: +----------+--------+--------+--------+-----------+------------------+           PSV cm/sEDV cm/sStenosisDescribe   Comments           +----------+--------+--------+--------+-----------+------------------+ CCA Prox  123     23                         intimal thickening +----------+--------+--------+--------+-----------+------------------+ CCA Distal134     25                                             +----------+--------+--------+--------+-----------+------------------+ ICA Prox  108     19              homogeneous                   +----------+--------+--------+--------+-----------+------------------+ ICA Distal78      28                                            +----------+--------+--------+--------+-----------+------------------+ ECA       155     18                                            +----------+--------+--------+--------+-----------+------------------+ +----------+--------+-------+--------+-------------------+           PSV cm/sEDV cmsDescribeArm Pressure (mmHG) +----------+--------+-------+--------+-------------------+ Subclavian140                                        +----------+--------+-------+--------+-------------------+ +---------+--------+--+--------+--+ VertebralPSV cm/s94EDV cm/s26 +---------+--------+--+--------+--+  Left Carotid Findings: +----------+--------+--------+--------+-----------+------------------+           PSV cm/sEDV cm/sStenosisDescribe   Comments           +----------+--------+--------+--------+-----------+------------------+ CCA Prox  144     24                         intimal thickening +----------+--------+--------+--------+-----------+------------------+ CCA Distal144     18                         intimal thickening +----------+--------+--------+--------+-----------+------------------+ ICA Prox  94      16              homogeneous                   +----------+--------+--------+--------+-----------+------------------+ ICA Distal98      36                                            +----------+--------+--------+--------+-----------+------------------+ ECA       180     20                                            +----------+--------+--------+--------+-----------+------------------+ +----------+--------+--------+--------+-------------------+  SubclavianPSV cm/sEDV  cm/sDescribeArm Pressure (mmHG) +----------+--------+--------+--------+-------------------+           139                                         +----------+--------+--------+--------+-------------------+ +---------+--------+--+--------+--+ VertebralPSV cm/s68EDV cm/s22 +---------+--------+--+--------+--+  Summary:  Left Carotid: The extracranial vessels were near-normal with only minimal wall               thickening or plaque. Vertebrals:  Bilateral vertebral arteries demonstrate antegrade flow. Subclavians: Normal flow hemodynamics were seen in bilateral subclavian              arteries. *See table(s) above for measurements and observations.  Electronically signed by Antony Contras MD on 03/02/2019 at 1:01:37 PM.    Final      Current Outpatient Medications (Endocrine & Metabolic):  .  glipiZIDE (GLUCOTROL) 5 MG tablet, Take 0.5 tablets (2.5 mg total) by mouth 2 (two) times daily. .  metFORMIN (GLUCOPHAGE) 1000 MG tablet, Take 1 tablet (1,000 mg total) by mouth 2 (two) times daily with a meal.  Current Outpatient Medications (Cardiovascular):  .  amLODipine (NORVASC) 10 MG tablet, Take 1 tablet (10 mg total) by mouth daily. Marland Kitchen  atorvastatin (LIPITOR) 80 MG tablet, Take 1 tablet (80 mg total) by mouth daily at 6 PM. .  valsartan-hydrochlorothiazide (DIOVAN-HCT) 320-25 MG tablet, Take 1 tablet by mouth daily.   Current Outpatient Medications (Analgesics):  .  aspirin EC 81 MG EC tablet, Take 1 tablet (81 mg total) by mouth daily.  Current Outpatient Medications (Hematological):  .  clopidogrel (PLAVIX) 75 MG tablet, Take 1 tablet (75 mg total) by mouth daily for 21 days.   Past Medical History:  Diagnosis Date  . Anxiety   . Hyperlipidemia   . Hypertension   . Migraines   . Prediabetes   . Vitamin D deficiency      Allergies  Allergen Reactions  . Atenolol Other (See Comments)    Made tired    ROS: all negative except above.   Physical Exam: There were no vitals  filed for this visit. There were no vitals taken for this visit. General Appearance: Well nourished, in no apparent distress. Eyes: PERRLA, EOMs, conjunctiva no swelling or erythema Sinuses: No Frontal/maxillary tenderness ENT/Mouth: Ext aud canals clear, TMs without erythema, bulging. No erythema, swelling, or exudate on post pharynx.  Tonsils not swollen or erythematous. Hearing normal.  Neck: Supple, thyroid normal.  Respiratory: Respiratory effort normal, BS equal bilaterally without rales, rhonchi, wheezing or stridor.  Cardio: RRR with no MRGs. Brisk peripheral pulses without edema.  Abdomen: Soft, + BS.  Non tender, no guarding, rebound, hernias, masses. Lymphatics: Non tender without lymphadenopathy.  Musculoskeletal: Full ROM, 5/5 strength, normal gait.  Skin: Warm, dry without rashes, lesions, ecchymosis.  Neuro: Cranial nerves intact. Normal muscle tone, no cerebellar symptoms. Sensation intact.  Psych: Awake and oriented X 3, normal affect, Insight and Judgment appropriate.     Vicie Mutters, PA-C 8:26 AM [facility]

## 2019-03-09 NOTE — Progress Notes (Signed)
Gabriel Hoover     This very nice 58 y.o. MWM was admitted to the hospital on  02/28/2019 and patient was discharged from the hospital on  03/03/2019. The patient now presents for follow up for transition from recent hospitalization.  The day after discharge  our clinical staff contacted the patient to assure stability and schedule a follow up appointment. The discharge summary, medications and diagnostic test results were reviewed before meeting with the patient. The patient was admitted for:   1. Acute cerebrovascular accident (CVA) of cerebellum ([name]) 2. Severe hypertension 3. Hyperlipidemia, mixed 4. Type 2 diabetes mellitus without long-term current use of insulin (HCC) 5. Poor compliance    Patient had a brief hospitalization 07/07-08 /2020 after an episode of Amaurosis Fugax and accelerated HTN and initial w/u was negative. He was discharged and his BP's remained very high consequent of his poor medicine compliance. He then was readmitted 07/18-21/2010 with a CVA of the left inferior Cerebellum with associated scattered ischemic infarcts in the bilateral cerebral hemispheres, brainstem, and right cerebellum. He was started on ASA & Plavix. Cardiac 2D echo, Carotid AaU/s, Bubble test and TEE were negative. He refused recommended 30 day event loop recorder monitoring. At discharge , deficits were limited to poor balance. During hospitalization, CBG's ran in the low 200's. Last BMET had a K = of 3.4. On 03/01/2019, A1c was 10.1%. Since home he has apparently done well disavowing an neuro or physical deficits.     Hospitalization discharge instructions and medications are reconciled with the patient.     Patient is also followed with Severe Hypertension, Hyperlipidemia, T2_NIDDM  and Vitamin D Deficiency.      Patient is treated for HTN (2008) & BP has been controlled at home. Today's BPis elevated at 152/90. Patient has had no complaints of any cardiac type chest pain,  palpitations, dyspnea/orthopnea/PND, dizziness, claudication, or dependent edema.     Hyperlipidemia is controlled with diet & meds. Patient denies myalgias or other med SE's. Last Lipids were at goal: Lab Results  Component Value Date   CHOL 114 03/01/2019   HDL 39 (L) 03/01/2019   LDLCALC 59 03/01/2019   TRIG 80 03/01/2019   CHOLHDL 2.9 03/01/2019      Also, the patient has history of T2_NIDDM (2015) and has had no symptoms of reactive hypoglycemia, diabetic polys, paresthesias or visual blurring.   Patient has not been compliant with meds, altho he alleges compliance since recent hospitalization.  Last A1c was not at goal: Lab Results  Component Value Date   HGBA1C 10.1 (H) 03/01/2019      Further, the patient also has history of Vitamin D Deficiency ("23" / 2013)  and supplements vitamin D without any suspected side-effects. Last vitamin D was very low: Lab Results  Component Value Date   VD25OH 23 (L) 07/02/2018   Current Outpatient Medications on File Prior to Visit  Medication Sig  . amLODipine (NORVASC) 10 MG tablet Take 1 tablet (10 mg total) by mouth daily.  Marland Kitchen aspirin EC 81 MG EC tablet Take 1 tablet (81 mg total) by mouth daily.  Marland Kitchen atorvastatin (LIPITOR) 80 MG tablet Take 1 tablet (80 mg total) by mouth daily at 6 PM.  . clopidogrel (PLAVIX) 75 MG tablet Take 1 tablet (75 mg total) by mouth daily for 21 days.  Marland Kitchen glipiZIDE (GLUCOTROL) 5 MG tablet Take 0.5 tablets (2.5 mg total) by mouth 2 (two) times daily.  . metFORMIN (GLUCOPHAGE) 1000  MG tablet Take 1 tablet (1,000 mg total) by mouth 2 (two) times daily with a meal.  . valsartan-hydrochlorothiazide (DIOVAN-HCT) 320-25 MG tablet Take 1 tablet by mouth daily.   No current facility-administered medications on file prior to visit.    Allergies  Allergen Reactions  . Atenolol Other (See Comments)    Made tired   PMHx:   Past Medical History:  Diagnosis Date  . Anxiety   . Hyperlipidemia   . Hypertension   .  Migraines   . Prediabetes   . Vitamin D deficiency    Past Surgical History:  Procedure Laterality Date  . BUBBLE STUDY  03/02/2019   Procedure: BUBBLE STUDY;  Surgeon: Thayer Headings, MD;  Location: [facility];  Service: Cardiovascular;;  . TEE WITHOUT CARDIOVERSION N/A 03/02/2019   Procedure: TRANSESOPHAGEAL ECHOCARDIOGRAM (TEE);  Surgeon: Acie Fredrickson Wonda Cheng, MD;  Location: [facility] ENDOSCOPY;  Service: Cardiovascular;  Laterality: N/A;   FHx:    Reviewed / unchanged  SHx:    Reviewed / unchanged   Systems Review:  Constitutional: Denies fever, chills, wt changes, headaches, insomnia, fatigue, night sweats, change in appetite. Eyes: Denies redness, blurred vision, diplopia, discharge, itchy, watery eyes.  ENT: Denies discharge, congestion, post nasal drip, epistaxis, sore throat, earache, hearing loss, dental pain, tinnitus, vertigo, sinus pain, snoring.  CV: Denies chest pain, palpitations, irregular heartbeat, syncope, dyspnea, diaphoresis, orthopnea, PND, claudication or edema. Respiratory: denies cough, dyspnea, DOE, pleurisy, hoarseness, laryngitis, wheezing.  Gastrointestinal: Denies dysphagia, odynophagia, heartburn, reflux, water brash, abdominal pain or cramps, nausea, vomiting, bloating, diarrhea, constipation, hematemesis, melena, hematochezia  or hemorrhoids. Genitourinary: Denies dysuria, frequency, urgency, nocturia, hesitancy, discharge, hematuria or flank pain. Musculoskeletal: Denies arthralgias, myalgias, stiffness, jt. swelling, pain, limping or strain/sprain.  Skin: Denies pruritus, rash, hives, warts, acne, eczema or change in skin lesion(s). Neuro: No weakness, tremor, incoordination, spasms, paresthesia or pain. Psychiatric: Denies confusion, memory loss or sensory loss. Endo: Denies change in weight, skin or hair change.  Heme/Lymph: No excessive bleeding, bruising or enlarged lymph nodes.  Physical Exam  BP (!) 152/90   Pulse 84   Temp (!) 97 F (36.1 C)    Resp 16   Ht 5\' 9"  (1.753 m)   Wt 175 lb (79.4 kg)   BMI 25.84 kg/m   Appears well nourished and in no distress.  Eyes: PERRLA, EOMs, conjunctiva no swelling or erythema. Sinuses: No frontal/maxillary tenderness ENT/Mouth: EAC's clear, TM's nl w/o erythema, bulging. Nares clear w/o erythema, swelling, exudates. Oropharynx clear without erythema or exudates. Oral hygiene is good. Tongue normal, non obstructing. Hearing intact.  Neck: Supple. Thyroid nl. Car 2+/2+ without bruits, nodes or JVD. Chest: Respirations nl with BS clear & equal w/o rales, rhonchi, wheezing or stridor.  Cor: Heart sounds normal w/ regular rate and rhythm without sig. murmurs, gallops, clicks or rubs. Peripheral pulses normal and equal  without edema.  Abdomen: Soft & bowel sounds normal. Non-tender w/o guarding, rebound, hernias, masses or organomegaly.  Lymphatics: Unremarkable.  Musculoskeletal: Full ROM all peripheral extremities, joint stability, 5/5 strength and normal gait.  Skin: Warm, dry without exposed rashes, lesions or ecchymosis apparent.  Neuro: Cranial nerves intact, reflexes equal bilaterally. Sensory-motor testing grossly intact. Tendon reflexes grossly intact.  Pysch: Alert & oriented x 3.  Insight and judgement limited.   Assessment and Plan:  1. Acute cerebrovascular accident (CVA) of cerebellum (HCC)  - Continue ASA & Plavix  2. Severe hypertension  - Continue medication, monitor blood pressure at home.  -  Continue DASH diet. Reminder to go to the ER if any CP,  SOB, nausea, dizziness, severe HA, changes vision/speech.  3. Hyperlipidemia, mixed  - Continue diet/meds, exercise,& lifestyle modifications.  - Continue monitor periodic cholesterol/liver & renal functions    4. Type 2 diabetes mellitus with complication (HCC)  - Continue diet, exercise, lifestyle modifications.  - Monitor appropriate labs.  5. Poor compliance  - long discussion re: importance of compliance with his  current med regimen.   6. Medication management       Discussed  regular exercise, BP monitoring, weight control to achieve/maintain BMI less than 25 and discussed meds and SE's. Recommended labs to assess and monitor clinical status with further disposition pending results of labs. Over 30 minutes of exam, counseling, chart review was performed.

## 2019-03-10 ENCOUNTER — Ambulatory Visit: Payer: BC Managed Care – PPO | Admitting: Physician Assistant

## 2019-03-10 ENCOUNTER — Encounter: Payer: Self-pay | Admitting: Internal Medicine

## 2019-03-10 ENCOUNTER — Ambulatory Visit (INDEPENDENT_AMBULATORY_CARE_PROVIDER_SITE_OTHER): Payer: BC Managed Care – PPO | Admitting: Internal Medicine

## 2019-03-10 ENCOUNTER — Other Ambulatory Visit: Payer: Self-pay

## 2019-03-10 VITALS — BP 152/90 | HR 84 | Temp 97.0°F | Resp 16 | Ht 69.0 in | Wt 175.0 lb

## 2019-03-10 DIAGNOSIS — E118 Type 2 diabetes mellitus with unspecified complications: Secondary | ICD-10-CM | POA: Diagnosis not present

## 2019-03-10 DIAGNOSIS — E782 Mixed hyperlipidemia: Secondary | ICD-10-CM

## 2019-03-10 DIAGNOSIS — I10 Essential (primary) hypertension: Secondary | ICD-10-CM | POA: Diagnosis not present

## 2019-03-10 DIAGNOSIS — Z91199 Patient's noncompliance with other medical treatment and regimen due to unspecified reason: Secondary | ICD-10-CM

## 2019-03-10 DIAGNOSIS — I639 Cerebral infarction, unspecified: Secondary | ICD-10-CM | POA: Diagnosis not present

## 2019-03-10 DIAGNOSIS — Z9119 Patient's noncompliance with other medical treatment and regimen: Secondary | ICD-10-CM

## 2019-03-10 DIAGNOSIS — Z79899 Other long term (current) drug therapy: Secondary | ICD-10-CM

## 2019-03-10 NOTE — Patient Instructions (Signed)
- Vit D  And Vit C 1,000 mg are recommended to help  protect against the Covid_19 and other Corona viruses.   - Also it's recommended to take Zinc 50 mg to help protect against the Covid_19   And best place to get  is also on [email or web address] and don't pay more than 6-8 cents /pill !   ===================================== Coronavirus (COVID-19) Are you at risk?  Are you at risk for the Coronavirus (COVID-19)?  To be considered HIGH RISK for Coronavirus (COVID-19), you have to meet the following criteria:   Traveled to [place], [place], [place], [place] or [place]; or in the [place] to [place], [place], [place]   or [place]; and have fever, cough, and shortness of breath within the last 2 weeks of travel OR  Been in close contact with a person diagnosed with COVID-19 within the last 2 weeks and have   fever, cough,and shortness of breath    IF YOU DO NOT MEET THESE CRITERIA, YOU ARE CONSIDERED LOW RISK FOR COVID-19.  What to do if you are HIGH RISK for COVID-19?   If you are having a medical emergency, call 911.  Seek medical care right away. Before you go to a doctors office, urgent care or emergency department,   call ahead and tell them about your recent travel, contact with someone diagnosed with COVID-19    and your symptoms.   You should receive instructions from your physicians office regarding next steps of care.   When you arrive at healthcare provider, tell the healthcare staff immediately you have returned from   visiting [place], [place], [place], [place] or [place]; or traveled in the [place] to [place], [place],   [place] or [place] in the last two weeks or you have been in close contact with a person diagnosed with   COVID-19 in the last 2 weeks.    Tell the health care staff about your symptoms: fever, cough and shortness of breath.  After you have been seen by a medical provider, you will be either: o Tested for (COVID-19) and  discharged home on quarantine except to seek medical care if  o symptoms worsen, and asked to  - Stay home and avoid contact with others until you get your results (4-5 days)  - Avoid travel on public transportation if possible (such as bus, train, or airplane) or o Sent to the Emergency Department by EMS for evaluation, COVID-19 testing  and  o possible admission depending on your condition and test results.  What to do if you are LOW RISK for COVID-19?  Reduce your risk of any infection by using the same precautions used for avoiding the common cold or flu:   Wash your hands often with soap and warm water for at least 20 seconds.  If soap and water are not readily available,   use an alcohol-based hand sanitizer with at least 60% alcohol.   If coughing or sneezing, cover your mouth and nose by coughing or sneezing into the elbow areas of your shirt or coat,   into a tissue or into your sleeve (not your hands).  Avoid shaking hands with others and consider head nods or verbal greetings only.  Avoid touching your eyes, nose, or mouth with unwashed hands.   Avoid close contact with people who are sick.  Avoid places or events with large numbers of people in one location, like concerts or sporting events.  Carefully consider travel plans you  have or are making.  If you are planning any travel outside or inside the [place], visit the [facility] webpage for the latest health notices.  If you have some symptoms but not all symptoms, continue to monitor at home and seek medical attention   if your symptoms worsen.  If you are having a medical emergency, call 911.   ++++++++++++++++++++++++++++++++ Recommend Adult Low Dose Aspirin or  coated  Aspirin 81 mg daily  To reduce risk of Colon Cancer 40 %,  Skin Cancer 26 % ,  Melanoma 46%  and  Pancreatic cancer 60% ++++++++++++++++++++++++++++++++ Vitamin D goal  is between 70-100.  Please make sure that you are taking  your Vitamin D as directed.  It is very important as a natural anti-inflammatory  helping hair, skin, and nails, as well as reducing stroke and heart attack risk.  It helps your bones and helps with mood. It also decreases numerous cancer risks so please take it as directed.  Low Vit D is associated with a 200-300% higher risk for CANCER  and 200-300% higher risk for HEART   ATTACK  &  STROKE.   .....................................Marland Kitchen It is also associated with higher death rate at younger ages,  autoimmune diseases like Rheumatoid arthritis, Lupus, Multiple Sclerosis.    Also many other serious conditions, like depression, Alzheimer's Dementia, infertility, muscle aches, fatigue, fibromyalgia - just to name a few. ++++++++++++++++++++ Recommend the book "The END of DIETING" by Dr Excell Seltzer  & the book "The END of DIABETES " by Dr Excell Seltzer At [email or web address] - get book & Audio CD's    Being diabetic has a  300% increased risk for heart attack, stroke, cancer, and alzheimer- type vascular dementia. It is very important that you work harder with diet by avoiding all foods that are white. Avoid white rice (brown & wild rice is OK), white potatoes (sweetpotatoes in moderation is OK), White bread or wheat bread or anything made out of white flour like bagels, donuts, rolls, buns, biscuits, cakes, pastries, cookies, pizza crust, and pasta (made from white flour & egg whites) - vegetarian pasta or spinach or wheat pasta is OK. Multigrain breads like Arnold's or Pepperidge [employer], or multigrain sandwich thins or flatbreads.  Diet, exercise and weight loss can reverse and cure diabetes in the early stages.  Diet, exercise and weight loss is very important in the control and prevention of complications of diabetes which affects every system in your body, ie. Brain - dementia/stroke, eyes - glaucoma/blindness, heart - heart attack/heart failure, kidneys - dialysis, stomach - gastric paralysis, intestines -  malabsorption, nerves - severe painful neuritis, circulation - gangrene & loss of a leg(s), and finally cancer and Alzheimers.    I recommend avoid fried & greasy foods,  sweets/candy, white rice (brown or wild rice or Quinoa is OK), white potatoes (sweet potatoes are OK) - anything made from white flour - bagels, doughnuts, rolls, buns, biscuits,white and wheat breads, pizza crust and traditional pasta made of white flour & egg white(vegetarian pasta or spinach or wheat pasta is OK).  Multi-grain bread is OK - like multi-grain flat bread or sandwich thins. Avoid alcohol in excess. Exercise is also important.    Eat all the vegetables you want - avoid meat, especially red meat and dairy - especially cheese.  Cheese is the most concentrated form of trans-fats which is the worst thing to clog up our arteries. Veggie cheese is OK which can be found in the fresh produce  section at [facility] or [employer] or [employer]  +++++++++++++++++++++ DASH Eating Plan  DASH stands for "Dietary Approaches to Stop Hypertension."   The DASH eating plan is a healthy eating plan that has been shown to reduce high blood pressure (hypertension). Additional health benefits may include reducing the risk of type 2 diabetes mellitus, heart disease, and stroke. The DASH eating plan may also help with weight loss. WHAT DO I NEED TO KNOW ABOUT THE DASH EATING PLAN? For the DASH eating plan, you will follow these general guidelines:  Choose foods with a percent daily value for sodium of less than 5% (as listed on the food label).  Use salt-free seasonings or herbs instead of table salt or sea salt.  Check with your health care provider or pharmacist before using salt substitutes.  Eat lower-sodium products, often labeled as "lower sodium" or "no salt added."  Eat fresh foods.  Eat more vegetables, fruits, and low-fat dairy products.  Choose whole grains. Look for the word "whole" as the first word in the ingredient  list.  Choose fish   Limit sweets, desserts, sugars, and sugary drinks.  Choose heart-healthy fats.  Eat veggie cheese   Eat more home-cooked food and less restaurant, buffet, and fast food.  Limit fried foods.  Cook foods using methods other than frying.  Limit canned vegetables. If you do use them, rinse them well to decrease the sodium.  When eating at a restaurant, ask that your food be prepared with less salt, or no salt if possible.                      WHAT FOODS CAN I EAT? Read Dr Fara Olden Fuhrman's books on The End of Dieting & The End of Diabetes  Grains Whole grain or whole wheat bread. Brown rice. Whole grain or whole wheat pasta. Quinoa, bulgur, and whole grain cereals. Low-sodium cereals. Corn or whole wheat flour tortillas. Whole grain cornbread. Whole grain crackers. Low-sodium crackers.  Vegetables Fresh or frozen vegetables (raw, steamed, roasted, or grilled). Low-sodium or reduced-sodium tomato and vegetable juices. Low-sodium or reduced-sodium tomato sauce and paste. Low-sodium or reduced-sodium canned vegetables.   Fruits All fresh, canned (in natural juice), or frozen fruits.  Protein Products  All fish and seafood.  Dried beans, peas, or lentils. Unsalted nuts and seeds. Unsalted canned beans.  Dairy Low-fat dairy products, such as skim or 1% milk, 2% or reduced-fat cheeses, low-fat ricotta or cottage cheese, or plain low-fat yogurt. Low-sodium or reduced-sodium cheeses.  Fats and Oils Tub margarines without trans fats. Light or reduced-fat mayonnaise and salad dressings (reduced sodium). Avocado. Safflower, olive, or canola oils. Natural peanut or almond butter.  Other Unsalted popcorn and pretzels. The items listed above may not be a complete list of recommended foods or beverages. Contact your dietitian for more options.  +++++++++++++++  WHAT FOODS ARE NOT RECOMMENDED? Grains/ White flour or wheat flour White bread. White pasta. White rice.  Refined cornbread. Bagels and croissants. Crackers that contain trans fat.  Vegetables  Creamed or fried vegetables. Vegetables in a . Regular canned vegetables. Regular canned tomato sauce and paste. Regular tomato and vegetable juices.  Fruits Dried fruits. Canned fruit in light or heavy syrup. Fruit juice.  Meat and Other Protein Products Meat in general - RED meat & White meat.  Fatty cuts of meat. Ribs, chicken wings, all processed meats as bacon, sausage, bologna, salami, fatback, hot dogs, bratwurst and packaged luncheon meats.  Dairy Whole  or 2% milk, cream, half-and-half, and cream cheese. Whole-fat or sweetened yogurt. Full-fat cheeses or blue cheese. Non-dairy creamers and whipped toppings. Processed cheese, cheese spreads, or cheese curds.  Condiments Onion and garlic salt, seasoned salt, table salt, and sea salt. Canned and packaged gravies. Worcestershire sauce. Tartar sauce. Barbecue sauce. Teriyaki sauce. Soy sauce, including reduced sodium. Steak sauce. Fish sauce. Oyster sauce. Cocktail sauce. Horseradish. Ketchup and mustard. Meat flavorings and tenderizers. Bouillon cubes. Hot sauce. Tabasco sauce. Marinades. Taco seasonings. Relishes.  Fats and Oils Butter, stick margarine, lard, shortening and bacon fat. Coconut, palm kernel, or palm oils. Regular salad dressings.  Pickles and olives. Salted popcorn and pretzels.  The items listed above may not be a complete list of foods and beverages to avoid.  ++++++++++++++++++++++  Ischemic Stroke  An ischemic stroke (cerebrovascular accident, or CVA) is the sudden death of brain tissue that occurs when an area of the brain does not get enough oxygen. It is a medical emergency that must be treated right away. An ischemic stroke can cause permanent loss of brain function. This can cause problems with how different parts of your body function. What are the causes? This condition is caused by a decrease of oxygen supply to an  area of the brain, which may be the result of:  A small blood clot (embolus) or a buildup of plaque in the blood vessels (atherosclerosis) that blocks blood flow in the brain.  An abnormal heart rhythm (atrial fibrillation).  A blocked or damaged artery in the head or neck. Sometimes the cause of stroke is not known (cryptogenic). What increases the risk? Certain factors may make you more likely to develop this condition. Some of these factors are things that you can change, such as:  Obesity.  Smoking cigarettes.  Taking oral birth control, especially if you also use tobacco.  Physical inactivity.  Excessive alcohol use.  Use of illegal drugs, especially cocaine and methamphetamine. Other risk factors include:  High blood pressure (hypertension).  High cholesterol.  Diabetes mellitus.  Heart disease.  Being [place] American, Native American, Hispanic, or [place] Native.  Being over age 34.  Family history of stroke.  Previous history of blood clots, stroke, or transient ischemic attack (TIA).  Sickle cell disease.  Being a woman with a history of preeclampsia.  Migraine headache.  Sleep apnea.  Irregular heartbeats, such as atrial fibrillation.  Chronic inflammatory diseases, such as rheumatoid arthritis or lupus.  Blood clotting disorders (hypercoagulable state). What are the signs or symptoms? Symptoms of this condition usually develop suddenly, or you may notice them after waking up from sleep. Symptoms may include sudden:  Weakness or numbness in your face, arm, or leg, especially on one side of your body.  Trouble walking or difficulty moving your arms or legs.  Loss of balance or coordination.  Confusion.  Slurred speech (dysarthria).  Trouble speaking, understanding speech, or both (aphasia).  Vision changes--such as double vision, blurred vision, or loss of vision--in one or both eyes.  Dizziness.  Nausea and vomiting.  Severe headache  with no known cause. The headache is often described as the worst headache ever experienced. If possible, make note of the exact time that you last felt like your normal self and what time your symptoms started. Tell your health care provider. If symptoms come and go, this could be a sign of a warning stroke, or TIA. Get help right away, even if you feel better. How is this diagnosed? This condition may be  diagnosed based on:  Your symptoms, your medical history, and a physical exam.  CT scan of the brain.  MRI.  CT angiogram. This test uses a computer to take X-rays of your arteries. A dye may be injected into your blood to show the inside of your blood vessels more clearly.  MRI angiogram. This is a type of MRI that is used to evaluate the blood vessels.  Cerebral angiogram. This test uses X-rays and a dye to show the blood vessels in the brain and neck. You may need to see a health care provider who specializes in stroke care. A stroke specialist can be seen in person or through communication using telephone or television technology (telemedicine). Other tests may also be done to find the cause of the stroke, such as:  Electrocardiogram (ECG).  Continuous heart monitoring.  Echocardiogram.  Transesophageal echocardiogram (TEE).  Carotid ultrasound.  A scan of the brain circulation.  Blood tests.  Sleep study to check for sleep apnea. How is this treated? Treatment for this condition will depend on the duration, severity, and cause of your symptoms and on the area of the brain affected. It is very important to get treatment at the first sign of stroke symptoms. Some treatments work better if they are done within 3-6 hours of the onset of stroke symptoms. These initial treatments may include:  Aspirin.  Medicines to control blood pressure.  Medicine given by injection to dissolve the blood clot (thrombolytic).  Treatments given directly to the affected artery to remove or  dissolve the blood clot. Other treatment options may include:  Oxygen.  IV fluids.  Medicines to thin the blood (anticoagulants or antiplatelets).  Procedures to increase blood flow. Medicines and changes to your diet may be used to help treat and manage risk factors for stroke, such as diabetes, high cholesterol, and high blood pressure. After a stroke, you may work with physical, speech, mental health, or occupational therapists to help you recover. Follow these instructions at home: Medicines  Take over-the-counter and prescription medicines only as told by your health care provider.  If you were told to take a medicine to thin your blood, such as aspirin or an anticoagulant, take it exactly as told by your health care provider. ? Taking too much blood-thinning medicine can cause bleeding. ? If you do not take enough blood-thinning medicine, you will not have the protection that you need against another stroke and other problems.  Understand the side effects of taking anticoagulant medicine. When taking this type of medicine, make sure you: ? Hold pressure over any cuts for longer than usual. ? Tell your dentist and other health care providers that you are taking anticoagulants before you have any procedures that may cause bleeding. ? Avoid activities that may cause trauma or injury. Eating and drinking  Follow instructions from your health care provider about diet.  Eat healthy foods.  If your ability to swallow was affected by the stroke, you may need to take steps to avoid choking, such as: ? Taking small bites when eating. ? Eating foods that are soft or pureed. Safety  Follow instructions from your health care team about physical activity.  Use a walker or cane as told by your health care provider.  Take steps to create a safe home environment in order to reduce the risk of falls. This may include: ? Having your home looked at by specialists. ? Installing grab bars  in the bedroom and bathroom. ? Using safety equipment,  such as raised toilets and a seat in the shower. General instructions  Do not use any tobacco products, such as cigarettes, chewing tobacco, and e-cigarettes. If you need help quitting, ask your health care provider.  Limit alcohol intake to no more than 1 drink a day for nonpregnant women and 2 drinks a day for men. One drink equals 12 oz of beer, 5 oz of wine, or 1 oz of hard liquor.  If you need help to stop using drugs or alcohol, ask your health care provider about a referral to a program or specialist.  Maintain an active and healthy lifestyle. Get regular exercise as told by your health care provider.  Keep all follow-up visits as told by your health care provider, including visits with all specialists on your health care team. This is important. How is this prevented? Your risk of another stroke can be decreased by managing high blood pressure, high cholesterol, diabetes, heart disease, sleep apnea, and obesity. It can also be decreased by quitting smoking, limiting alcohol, and staying physically active. Your health care provider will continue to work with you on measures to prevent short-term and long-term complications of stroke. Get help right away if:   You have any symptoms of a stroke. "BE FAST" is an easy way to remember the main warning signs of a stroke: ? B - Balance. Signs are dizziness, sudden trouble walking, or loss of balance. ? E - Eyes. Signs are trouble seeing or a sudden change in vision. ? F - Face. Signs are sudden weakness or numbness of the face, or the face or eyelid drooping on one side. ? A - Arms. Signs are weakness or numbness in an arm. This happens suddenly and usually on one side of the body. ? S - Speech. Signs are sudden trouble speaking, slurred speech, or trouble understanding what people say. ? T - Time. Time to call emergency services. Write down what time symptoms started.  You have other  signs of a stroke, such as: ? A sudden, severe headache with no known cause. ? Nausea or vomiting. ? Seizure.  These symptoms may represent a serious problem that is an emergency. Do not wait to see if the symptoms will go away. Get medical help right away. Call your local emergency services (911 in the [place] Do not drive yourself to the hospital. Summary  An ischemic stroke (cerebrovascular accident, or CVA) is the sudden death of brain tissue that occurs when an area of the brain does not get enough oxygen.  Symptoms of this condition usually develop suddenly, or you may notice them after waking up from sleep.  It is very important to get treatment at the first sign of stroke symptoms. Stroke is a medical emergency that must be treated right away.  ++++++++++++++++++++++  Rehabilitation After a Stroke  A stroke causes damage to the brain cells, which can affect your ability to walk, talk, or remember things. The impact of a stroke is different for everyone, and so is recovery. Some people have progress during the first few days after treatment. Others may take weeks or longer to make progress. Stroke rehabilitation includes a variety of treatments to help you recover and promote your independence after a stroke. You may not be able do everything that you did before the stroke, but you can learn ways to manage your lifestyle and be as independent as possible. Rehabilitation will start as soon as you are able to participate after your stroke, and it  involves care from a team that may include:  Family and friends. Your loved ones know you best and can be very helpful in your recovery.  Physicians.  Nurses.  Physical and occupational therapists.  Speech-language therapists.  A nutritionist.  A psychologist.  A Education officer, museum. Keep open communication with all members of your care team. Share your medical records if needed, and take notes about each provider's recommendations. What is  physical therapy? Physical therapists (PTs) help you to improve your coordination, balance, and muscle strength. Physical therapy may involve:  Range of motion exercises.  Help to move between lying, sitting, and standing positions.  Walking with a cane or walker, if needed.  Help using stairs. What is occupational therapy? Occupational therapists (OTs) help you rebuild your ability to do everyday tasks, such as brushing your teeth, going to the bathroom, eating, and getting dressed. Occupational therapy may also help with:  Vision. Visual scanning is a technique that is used to prevent falls.  Memory and cognitive training. This therapy includes problem-solving techniques and relearning tasks like making a phone call.  Fine muscle movements such as buttoning a shirt or picking up small objects. What is speech therapy? Speech-language therapists help you communicate. After a stroke, you may have problems understanding what people are saying, or you may have trouble writing, speaking, or finding the right word for what you want to say. You may also need speech therapy if you have difficulty swallowing while eating and drinking. Examples of speech-language therapies include:  Techniques to strengthen muscles used in swallowing.  Naming objects or describing pictures. This helps retrain the brain to recognize and remember words.  Exercises to strengthen the muscles involved in talking, including your tongue and lips.  Exercises to retrain your brain in understanding what you read and hear. How often will I need therapy? Therapy will begin as soon as you are able to participate, which is often within the first few days after a stroke. Sessions will be frequent at first. For example, you may have therapy 2-3 hours a day on most days of the week during the first few months. The intensity depends on the type and severity of your stroke. You may need therapy for several months. Therapy may take  place in the hospital, at a rehabilitation center, or in your home. Are there any side effects of therapy? Therapy is safe and is usually well-tolerated. You may feel physically and mentally tired after therapy, especially during the first few weeks. Rest before therapy sessions if you need to so you can get the most out of your rehabilitation. Follow these instructions at home:  Involve your family and friends in your recovery, if possible. Having another person to encourage you is beneficial.  Follow instructions from your speech-language therapist, nutritionist, or health care provider about what you can safely eat and drink. Eat healthy foods. If your ability to swallow was affected by the stroke, you may need to take steps to avoid choking, such as: ? Taking small bites when eating. ? Eating foods that are soft or pureed. ? Drinking liquids that have been thickened.  Maintain social connections and interactions with friends, family, and community groups. This is an important part of your recovery. Communication challenges and physical challenges may cause you to feel isolated after a stroke.  Consider joining a support group that allows you to talk about the impact of stroke on your life. A psychologist or counselor may be recommended. Your emotional recovery from  stroke is just as important as your physical recovery.  Keep all follow-up visits as told by your health care providers. This is important. Summary  Stroke rehabilitation includes a variety of treatments to help you recover and promote your independence after a stroke.  Rehabilitation will start as soon as you are able to participate after your stroke, and it includes care from a team of experts.  The intensity of therapy depends on the type and severity of your stroke. You may need therapy for several months.

## 2019-03-12 DIAGNOSIS — M6281 Muscle weakness (generalized): Secondary | ICD-10-CM | POA: Diagnosis not present

## 2019-03-12 DIAGNOSIS — R42 Dizziness and giddiness: Secondary | ICD-10-CM | POA: Diagnosis not present

## 2019-03-12 DIAGNOSIS — I639 Cerebral infarction, unspecified: Secondary | ICD-10-CM | POA: Diagnosis not present

## 2019-03-19 ENCOUNTER — Other Ambulatory Visit: Payer: Self-pay

## 2019-03-19 DIAGNOSIS — R42 Dizziness and giddiness: Secondary | ICD-10-CM | POA: Diagnosis not present

## 2019-03-19 DIAGNOSIS — I639 Cerebral infarction, unspecified: Secondary | ICD-10-CM | POA: Diagnosis not present

## 2019-03-19 DIAGNOSIS — M6281 Muscle weakness (generalized): Secondary | ICD-10-CM | POA: Diagnosis not present

## 2019-03-19 NOTE — Patient Outreach (Signed)
[facility]) Care Management  03/19/2019  Gabriel Hoover [id number]   EMMI- Stroke RED ON EMMI ALERT Day # 13 Date: 03/18/2019 Red Alert Reason:  Feeling worse overall? Yes   Outreach attempt: no answer.  HIPAA compliant voice message left.     Plan: RN CM will attempt patient again within 4 business days.    Jone Baseman, RN, MSN [facility] Care Management Care Management Coordinator Direct Line [phone] Toll Free: [phone]  Fax: [phone]

## 2019-03-20 ENCOUNTER — Other Ambulatory Visit: Payer: Self-pay

## 2019-03-20 NOTE — Patient Outreach (Signed)
[facility]) Care Management  03/20/2019  Gabriel Hoover 28-Dec-1960 [id number]   EMMI- Stroke RED ON EMMI ALERT Day # 13 Date:03/18/2019 Red Alert Reason: Feeling worse overall? Yes   Outreach attempt: Spoke with patient.  Advised on reason for call. Patient states he does not do automated phone calls.  Advised patient that CM could have the calls stopped. Patient requests that they stop.  Patient voices no other concerns.    Plan: RN CM will close case.    Jone Baseman, RN, MSN [facility] Care Management Coordinator Direct Line [phone] Cell [phone] Toll Free: [phone]  Fax: [phone]

## 2019-03-24 ENCOUNTER — Other Ambulatory Visit: Payer: Self-pay | Admitting: *Deleted

## 2019-03-24 DIAGNOSIS — E785 Hyperlipidemia, unspecified: Secondary | ICD-10-CM

## 2019-03-24 MED ORDER — GLIPIZIDE 5 MG PO TABS: ORAL_TABLET | ORAL | 0 refills | Status: DC

## 2019-03-24 MED ORDER — ATORVASTATIN CALCIUM 80 MG PO TABS: ORAL_TABLET | ORAL | 0 refills | Status: DC

## 2019-03-24 MED ORDER — VALSARTAN-HYDROCHLOROTHIAZIDE 320-25 MG PO TABS: ORAL_TABLET | ORAL | 0 refills | Status: DC

## 2019-03-24 MED ORDER — AMLODIPINE BESYLATE 10 MG PO TABS: ORAL_TABLET | ORAL | 0 refills | Status: DC

## 2019-03-24 MED ORDER — METFORMIN HCL 1000 MG PO TABS: ORAL_TABLET | ORAL | 0 refills | Status: DC

## 2019-04-02 ENCOUNTER — Telehealth: Payer: Self-pay | Admitting: *Deleted

## 2019-04-02 NOTE — Telephone Encounter (Signed)
Per Dr Melford Aase, the patient's spouse ws advised a new RX for Metformin 500 mg will be sent in to the pharmacy and the patient should start with one  500 mg tablet with his largest meal.

## 2019-04-02 NOTE — Telephone Encounter (Signed)
Spouse called and reported that the patient is taking Metformin 100 mg twice a day with meals. She states he has been throwing up.  She reports is blood sugars are good.  Per Dr Melford Aase, the patient can reduce the dose to 1 tablet with a meal and should call back, if needed.

## 2019-04-08 ENCOUNTER — Ambulatory Visit: Payer: BC Managed Care – PPO | Admitting: *Deleted

## 2019-04-08 ENCOUNTER — Inpatient Hospital Stay: Payer: BC Managed Care – PPO | Admitting: Adult Health

## 2019-04-16 ENCOUNTER — Inpatient Hospital Stay: Payer: BC Managed Care – PPO | Admitting: Adult Health

## 2019-04-21 ENCOUNTER — Encounter: Payer: Self-pay | Admitting: Internal Medicine

## 2019-04-21 ENCOUNTER — Ambulatory Visit (INDEPENDENT_AMBULATORY_CARE_PROVIDER_SITE_OTHER): Payer: BC Managed Care – PPO | Admitting: Internal Medicine

## 2019-04-21 DIAGNOSIS — Z5329 Procedure and treatment not carried out because of patient's decision for other reasons: Secondary | ICD-10-CM

## 2019-04-21 DIAGNOSIS — Z9119 Patient's noncompliance with other medical treatment and regimen: Secondary | ICD-10-CM

## 2019-04-21 NOTE — Progress Notes (Signed)
NO SHOW

## 2019-04-29 ENCOUNTER — Other Ambulatory Visit: Payer: Self-pay | Admitting: Internal Medicine

## 2019-04-29 MED ORDER — METFORMIN HCL 1000 MG PO TABS: ORAL_TABLET | ORAL | 0 refills | Status: DC

## 2019-07-01 ENCOUNTER — Other Ambulatory Visit: Payer: Self-pay

## 2019-07-01 ENCOUNTER — Ambulatory Visit: Payer: Self-pay | Admitting: Internal Medicine

## 2019-07-01 ENCOUNTER — Encounter: Payer: Self-pay | Admitting: Internal Medicine

## 2019-07-01 VITALS — BP 172/116 | HR 64 | Temp 97.6°F | Resp 16 | Ht 69.0 in | Wt 187.0 lb

## 2019-07-01 DIAGNOSIS — E118 Type 2 diabetes mellitus with unspecified complications: Secondary | ICD-10-CM

## 2019-07-01 DIAGNOSIS — Z79899 Other long term (current) drug therapy: Secondary | ICD-10-CM

## 2019-07-01 DIAGNOSIS — I10 Essential (primary) hypertension: Secondary | ICD-10-CM

## 2019-07-01 DIAGNOSIS — E785 Hyperlipidemia, unspecified: Secondary | ICD-10-CM

## 2019-07-01 DIAGNOSIS — E1169 Type 2 diabetes mellitus with other specified complication: Secondary | ICD-10-CM

## 2019-07-01 MED ORDER — GLIPIZIDE 5 MG PO TABS: ORAL_TABLET | ORAL | 0 refills | Status: DC

## 2019-07-01 MED ORDER — AMLODIPINE BESYLATE 10 MG PO TABS: ORAL_TABLET | ORAL | 0 refills | Status: DC

## 2019-07-01 MED ORDER — ATORVASTATIN CALCIUM 80 MG PO TABS: ORAL_TABLET | ORAL | 0 refills | Status: DC

## 2019-07-01 MED ORDER — LOSARTAN POTASSIUM-HCTZ 100-25 MG PO TABS: ORAL_TABLET | ORAL | 0 refills | Status: DC

## 2019-07-01 MED ORDER — METFORMIN HCL ER 500 MG PO TB24: ORAL_TABLET | ORAL | 0 refills | Status: DC

## 2019-07-01 NOTE — Progress Notes (Addendum)
History of Present Illness:      This very nice 58 y.o. MWM presents for belated  follow up with  Hx/o Severe HTN, HLD, Pre-Diabetes and Vitamin D Deficiency.        The patient was hospitalized 7/18-21/2020 with an acute Left inferior Cerebellar CVA and very labile elevated BP's. He was found to have scattered ischemic infarcts in the bilateral cerebral hemispheres, brainstem, and right cerebellum & Cardiac 2D echo, Carotid AaU/s, Bubble test and TEE were negative. He was started on ASA & Plavix. .        Patient had been  treated for HTN circa 2008. Patient has not kept recommended  F/u app'ts and admits not taking recommended meds for the last 2 + weeks. Today's BP is very elevated at 172/116.Marland Kitchen Patient has had no complaints of any cardiac type chest pain, palpitations, dyspnea / orthopnea / PND, dizziness, claudication, or dependent edema.      Hyperlipidemia is controlled with diet & meds. Patient denies myalgias or other med SE's. Last Lipids were at goal:  Lab Results  Component Value Date   CHOL 114 03/01/2019   HDL 39 (L) 03/01/2019   LDLCALC 59 03/01/2019   TRIG 80 03/01/2019   CHOLHDL 2.9 03/01/2019        Also, the patient has history of T2_NIDDM circa 2015 and has had no symptoms of reactive hypoglycemia, diabetic polys, paresthesias or visual blurring.  Patient admits that he has not been monitoring CBG's due to his fear of having "finger sticks".   Last A1c was not at goal:  Lab Results  Component Value Date   HGBA1C 10.1 (H) 03/01/2019           Further, the patient also has history of Vitamin D Deficiency ("23" / 2013) and supplements vitamin D without any suspected side-effects. Last vitamin D was still very low:  Lab Results  Component Value Date   VD25OH 23 (L) 07/02/2018    Current Outpatient Medications on File Prior to Visit  Medication Sig  . aspirin EC 81 MG EC tablet Take 1 tablet (81 mg total) by mouth daily.   No current facility-administered  medications on file prior to visit.     Allergies  Allergen Reactions  . Atenolol Other (See Comments)    Made tired   PMHx:   Past Medical History:  Diagnosis Date  . Anxiety   . Hyperlipidemia   . Hypertension   . Migraines   . Prediabetes   . Vitamin D deficiency     Past Surgical History:  Procedure Laterality Date  . BUBBLE STUDY  03/02/2019   Procedure: BUBBLE STUDY;  Surgeon: Thayer Headings, MD;  Location: [place];  Service: Cardiovascular;;  . TEE WITHOUT CARDIOVERSION N/A 03/02/2019   Procedure: TRANSESOPHAGEAL ECHOCARDIOGRAM (TEE);  Surgeon: Acie Fredrickson Wonda Cheng, MD;  Location: [facility] ENDOSCOPY;  Service: Cardiovascular;  Laterality: N/A;    FHx:    Reviewed / unchanged  SHx:    Reviewed / unchanged   Systems Review:  Constitutional: Denies fever, chills, wt changes, headaches, insomnia, fatigue, night sweats, change in appetite. Eyes: Denies redness, blurred vision, diplopia, discharge, itchy, watery eyes.  ENT: Denies discharge, congestion, post nasal drip, epistaxis, sore throat, earache, hearing loss, dental pain, tinnitus, vertigo, sinus pain, snoring.  CV: Denies chest pain, palpitations, irregular heartbeat, syncope, dyspnea, diaphoresis, orthopnea, PND, claudication or edema. Respiratory: denies cough, dyspnea, DOE, pleurisy, hoarseness, laryngitis, wheezing.  Gastrointestinal: Denies dysphagia, odynophagia,  heartburn, reflux, water brash, abdominal pain or cramps, nausea, vomiting, bloating, diarrhea, constipation, hematemesis, melena, hematochezia  or hemorrhoids. Genitourinary: Denies dysuria, frequency, urgency, nocturia, hesitancy, discharge, hematuria or flank pain. Musculoskeletal: Denies arthralgias, myalgias, stiffness, jt. swelling, pain, limping or strain/sprain.  Skin: Denies pruritus, rash, hives, warts, acne, eczema or change in skin lesion(s). Neuro: No weakness, tremor, incoordination, spasms, paresthesia or pain. Psychiatric: Denies  confusion, memory loss or sensory loss. Endo: Denies change in weight, skin or hair change.  Heme/Lymph: No excessive bleeding, bruising or enlarged lymph nodes.  Physical Exam  BP (!) 172/116   Pulse 64   Temp 97.6 F (36.4 C)   Resp 16   Ht 5\' 9"  (1.753 m)   Wt 187 lb (84.8 kg)   BMI 27.62 kg/m   Appears  well nourished, well groomed  and in no distress.  Eyes: PERRLA, EOMs, conjunctiva no swelling or erythema. Sinuses: No frontal/maxillary tenderness ENT/Mouth: EAC's clear, TM's nl w/o erythema, bulging. Nares clear w/o erythema, swelling, exudates. Oropharynx clear without erythema or exudates. Oral hygiene is good. Tongue normal, non obstructing. Hearing intact.  Neck: Supple. Thyroid not palpable. Car 2+/2+ without bruits, nodes or JVD. Chest: Respirations nl with BS clear & equal w/o rales, rhonchi, wheezing or stridor.  Cor: Heart sounds normal w/ regular rate and rhythm without sig. murmurs, gallops, clicks or rubs. Peripheral pulses normal and equal  without edema.  Abdomen: Soft & bowel sounds normal. Non-tender w/o guarding, rebound, hernias, masses or organomegaly.  Lymphatics: Unremarkable.  Musculoskeletal: Full ROM all peripheral extremities, joint stability, 5/5 strength and normal gait.  Skin: Warm, dry without exposed rashes, lesions or ecchymosis apparent.  Neuro: Cranial nerves intact, reflexes equal bilaterally. Sensory-motor testing grossly intact. Tendon reflexes grossly intact.  Pysch: Alert & oriented x 3.  Insight and judgement nl & appropriate. No ideations.  Assessment and Plan:  1. Severe hypertension  - Continue medication, monitor blood pressure at home.  - Continue DASH diet.  Reminder to go to the ER if any CP,  SOB, nausea, dizziness, severe HA, changes vision/speech.  2. Hyperlipidemia associated with type 2 diabetes mellitus ([place])  - Continue diet/meds, exercise,& lifestyle modifications.  - Continue monitor periodic cholesterol/liver &  renal functions   3. Type 2 diabetes mellitus with complication (HCC)  - Continue diet, exercise  - Lifestyle modifications.  - Monitor appropriate labs.  5. Medication management         Discussed  regular exercise, BP monitoring, weight control to achieve/maintain BMI less than 25 and discussed med and SE's. Recommended labs to assess and monitor clinical status with further disposition pending results of labs.  I discussed the assessment and treatment plan with the patient. The patient was provided an opportunity to ask questions and all were answered. The patient agreed with the plan and demonstrated an understanding of the instructions.  I provided over 30 minutes of exam, counseling, chart review and  complex critical decision making.  Kirtland Bouchard, MD

## 2019-07-01 NOTE — Patient Instructions (Signed)

## 2019-07-02 ENCOUNTER — Other Ambulatory Visit: Payer: Self-pay | Admitting: Internal Medicine

## 2019-07-02 DIAGNOSIS — E1169 Type 2 diabetes mellitus with other specified complication: Secondary | ICD-10-CM

## 2019-07-02 DIAGNOSIS — I10 Essential (primary) hypertension: Secondary | ICD-10-CM

## 2019-07-02 DIAGNOSIS — E785 Hyperlipidemia, unspecified: Secondary | ICD-10-CM

## 2019-07-02 DIAGNOSIS — E118 Type 2 diabetes mellitus with unspecified complications: Secondary | ICD-10-CM

## 2019-07-02 MED ORDER — LOSARTAN POTASSIUM-HCTZ 100-25 MG PO TABS: ORAL_TABLET | ORAL | 0 refills | Status: DC

## 2019-07-02 MED ORDER — AMLODIPINE BESYLATE 10 MG PO TABS: ORAL_TABLET | ORAL | 0 refills | Status: DC

## 2019-07-02 MED ORDER — GLIPIZIDE 5 MG PO TABS: ORAL_TABLET | ORAL | 0 refills | Status: DC

## 2019-07-02 MED ORDER — ATORVASTATIN CALCIUM 80 MG PO TABS: ORAL_TABLET | ORAL | 0 refills | Status: DC

## 2019-07-02 MED ORDER — METFORMIN HCL ER 500 MG PO TB24: ORAL_TABLET | ORAL | 0 refills | Status: DC

## 2019-08-30 ENCOUNTER — Other Ambulatory Visit: Payer: Self-pay | Admitting: Internal Medicine

## 2019-08-30 DIAGNOSIS — E1169 Type 2 diabetes mellitus with other specified complication: Secondary | ICD-10-CM

## 2019-10-03 ENCOUNTER — Other Ambulatory Visit: Payer: Self-pay | Admitting: Internal Medicine

## 2019-10-03 DIAGNOSIS — I10 Essential (primary) hypertension: Secondary | ICD-10-CM

## 2019-10-03 DIAGNOSIS — E118 Type 2 diabetes mellitus with unspecified complications: Secondary | ICD-10-CM

## 2019-10-12 NOTE — Progress Notes (Signed)
History of Present Illness:      This very nice 59 y.o. MWM  presents for 3 month follow up with HTN, HLD, Pre-Diabetes and Vitamin D Deficiency.  Patient was last seen in office on July 28  - post hospitalization for a CVA. He has hx/o poor compliance and has declined recommended office f/u and rx refills had been declined in Sept 2020  (given 2 week supply of Metformin) pending an OV to evaluate patient's status.        Patient has hx/o Severe labile HTN predating since [id number] complicated by poor insight /comprehension and very poor compliance. . Today's BP is elevated at  154/96 attributed to his anxiety re: having venipuncture. In July 2020, patient was hospitalized with a left cerebellar CVA. Cardiac 2D echo, Carotid Aa U/S, Bubble test and TEE were negative. He was started on ASA & Plavix.   Patient has had no complaints of any cardiac type chest pain, palpitations, dyspnea / orthopnea / PND, dizziness, claudication, or dependent edema.      Hyperlipidemia is controlled with diet & meds. Patient denies myalgias or other med SE's. Last Lipids were at goal:  Lab Results  Component Value Date   CHOL 114 03/01/2019   HDL 39 (L) 03/01/2019   LDLCALC 59 03/01/2019   TRIG 80 03/01/2019   CHOLHDL 2.9 03/01/2019    Also, the patient has history of T2_NIDDM (2015) and has had no symptoms of reactive hypoglycemia, diabetic polys, paresthesias or visual blurring.  Last A1c was not at goal:  Lab Results  Component Value Date   HGBA1C 10.1 (H) 03/01/2019       Further, the patient also has history of Vitamin D Deficiency ("23" / 2013)   and supplements vitamin D without any suspected side-effects. Last vitamin D was still very low:  Lab Results  Component Value Date   VD25OH 23 (L) 07/02/2018    Current Outpatient Medications on File Prior to Visit  Medication Sig  . amLODipine (NORVASC) 10 MG tablet Take 1/2 tablet at Bedtime for BP  . aspirin EC 81 MG EC tablet Take 1 tablet (81  mg total) by mouth daily.  Marland Kitchen atorvastatin (LIPITOR) 80 MG tablet Take 1 tablet Daily for Cholesterol  . glipiZIDE (GLUCOTROL) 5 MG tablet Take 1/2 tablet 2 x /day with Meals for Diabetes  . losartan-hydrochlorothiazide (HYZAAR) 100-25 MG tablet Take 1/2 to 1 tablet every Morning for BP  . metFORMIN (GLUCOPHAGE-XR) 500 MG 24 hr tablet Take 1 to 2 tablets 2 x /day withMeals for Diabetes   No current facility-administered medications on file prior to visit.    Allergies  Allergen Reactions  . Atenolol Other (See Comments)    Made tired    PMHx:   Past Medical History:  Diagnosis Date  . Anxiety   . Hyperlipidemia   . Hypertension   . Migraines   . Prediabetes   . Vitamin D deficiency      There is no immunization history on file for this patient.  Past Surgical History:  Procedure Laterality Date  . BUBBLE STUDY  03/02/2019   Procedure: BUBBLE STUDY;  Surgeon: Thayer Headings, MD;  Location: [place];  Service: Cardiovascular;;  . TEE WITHOUT CARDIOVERSION N/A 03/02/2019   Procedure: TRANSESOPHAGEAL ECHOCARDIOGRAM (TEE);  Surgeon: Acie Fredrickson Wonda Cheng, MD;  Location[facility] END[facility]COPY;  Service: Cardiovascular;  Laterality: N/A;    FHx:    Reviewed / unchanged  SHx:    Reviewed /  unchanged   Systems Review:  Constitutional: Denies fever, chills, wt changes, headaches, insomnia, fatigue, night sweats, change in appetite. Eyes: Denies redness, blurred vision, diplopia, discharge, itchy, watery eyes.  ENT: Denies discharge, congestion, post nasal drip, epistaxis, sore throat, earache, hearing loss, dental pain, tinnitus, vertigo, sinus pain, snoring.  CV: Denies chest pain, palpitations, irregular heartbeat, syncope, dyspnea, diaphoresis, orthopnea, PND, claudication or edema. Respiratory: denies cough, dyspnea, DOE, pleurisy, hoarseness, laryngitis, wheezing.  Gastrointestinal: Denies dysphagia, odynophagia, heartburn, reflux, water brash, abdominal pain or cramps, nausea,  vomiting, bloating, diarrhea, constipation, hematemesis, melena, hematochezia  or hemorrhoids. Genitourinary: Denies dysuria, frequency, urgency, nocturia, hesitancy, discharge, hematuria or flank pain. Musculoskeletal: Denies arthralgias, myalgias, stiffness, jt. swelling, pain, limping or strain/sprain.  Skin: Denies pruritus, rash, hives, warts, acne, eczema or change in skin lesion(s). Neuro: No weakness, tremor, incoordination, spasms, paresthesia or pain. Psychiatric: Denies confusion, memory loss or sensory loss. Endo: Denies change in weight, skin or hair change.  Heme/Lymph: No excessive bleeding, bruising or enlarged lymph nodes.  Physical Exam  BP (!) 154/96   Pulse 64   Temp (!) 97 F (36.1 C)   Resp 16   Ht 5\' 9"  (1.753 m)   Wt 185 lb 6.4 oz (84.1 kg)   BMI 27.38 kg/m   Appears  well nourished, well groomed  and in no distress.  Eyes: PERRLA, EOMs, conjunctiva no swelling or erythema. Sinuses: No frontal/maxillary tenderness ENT/Mouth: EAC's clear, TM's nl w/o erythema, bulging. Nares clear w/o erythema, swelling, exudates. Oropharynx clear without erythema or exudates. Oral hygiene is good. Tongue normal, non obstructing. Hearing intact.  Neck: Supple. Thyroid not palpable. Car 2+/2+ without bruits, nodes or JVD. Chest: Respirations nl with BS clear & equal w/o rales, rhonchi, wheezing or stridor.  Cor: Heart sounds normal w/ regular rate and rhythm without sig. murmurs, gallops, clicks or rubs. Peripheral pulses normal and equal  without edema.  Abdomen: Soft & bowel sounds normal. Non-tender w/o guarding, rebound, hernias, masses or organomegaly.  Lymphatics: Unremarkable.  Musculoskeletal: Full ROM all peripheral extremities, joint stability, 5/5 strength and normal gait.  Skin: Warm, dry without exposed rashes, lesions or ecchymosis apparent.  Neuro: Cranial nerves intact, reflexes equal bilaterally. Sensory-motor testing grossly intact. Tendon reflexes grossly  intact.  Pysch: Alert & oriented x 3.  Insight and judgement nl & appropriate. No ideations.  Assessment and Plan:  1. Severe hypertension  - Continue medication, monitor blood pressure at home.  - Continue DASH diet.  Reminder to go to the ER if any CP,  SOB, nausea, dizziness, severe HA, changes vision/speech.  - CBC with Differential/Platelet - COMPLETE METABOLIC PANEL WITH GFR - Magnesium - TSH  2. Hyperlipidemia associated with type 2 diabetes mellitus ([place])  - Continue diet/meds, exercise,& lifestyle modifications.  - Continue monitor periodic cholesterol/liver & renal functions   - Lipid panel - TSH  3. Type 2 diabetes mellitus with complication (HCC)  - Continue diet, exercise  - Lifestyle modifications.  - Monitor appropriate labs.  - Hemoglobin A1c - Insulin, random  4. Vitamin D deficiency  - Continue supplementation.  - VITAMIN D 25 Hydroxy  5. History of cerebrovascular accident (CVA) involving cerebellum  6. Poor compliance  7. Medication management  - CBC with Differential/Platelet - COMPLETE METABOLIC PANEL WITH GFR - Magnesium - Lipid panel - TSH - Hemoglobin A1c - Insulin, random - VITAMIN D 25 Hydroxy         Discussed  regular exercise, BP monitoring, weight control to achieve/maintain BMI  less than 25 and discussed med and SE's. Recommended labs to assess and monitor clinical status with further disposition pending results of labs.  I discussed the assessment and treatment plan with the patient. The patient was provided an opportunity to ask questions and all were answered. The patient agreed with the plan and demonstrated an understanding of the instructions.  I provided over 30 minutes of exam, counseling, chart review and  complex critical decision making.   Kirtland Bouchard, MD

## 2019-10-13 ENCOUNTER — Ambulatory Visit: Payer: PRIVATE HEALTH INSURANCE | Admitting: Internal Medicine

## 2019-10-13 ENCOUNTER — Encounter: Payer: Self-pay | Admitting: Internal Medicine

## 2019-10-13 ENCOUNTER — Other Ambulatory Visit: Payer: Self-pay

## 2019-10-13 VITALS — BP 154/96 | HR 64 | Temp 97.0°F | Resp 16 | Ht 69.0 in | Wt 185.4 lb

## 2019-10-13 DIAGNOSIS — E118 Type 2 diabetes mellitus with unspecified complications: Secondary | ICD-10-CM

## 2019-10-13 DIAGNOSIS — E559 Vitamin D deficiency, unspecified: Secondary | ICD-10-CM | POA: Diagnosis not present

## 2019-10-13 DIAGNOSIS — Z8673 Personal history of transient ischemic attack (TIA), and cerebral infarction without residual deficits: Secondary | ICD-10-CM

## 2019-10-13 DIAGNOSIS — E785 Hyperlipidemia, unspecified: Secondary | ICD-10-CM

## 2019-10-13 DIAGNOSIS — Z91199 Patient's noncompliance with other medical treatment and regimen due to unspecified reason: Secondary | ICD-10-CM

## 2019-10-13 DIAGNOSIS — Z79899 Other long term (current) drug therapy: Secondary | ICD-10-CM

## 2019-10-13 DIAGNOSIS — I10 Essential (primary) hypertension: Secondary | ICD-10-CM | POA: Diagnosis not present

## 2019-10-13 DIAGNOSIS — E1169 Type 2 diabetes mellitus with other specified complication: Secondary | ICD-10-CM | POA: Diagnosis not present

## 2019-10-13 DIAGNOSIS — Z9119 Patient's noncompliance with other medical treatment and regimen: Secondary | ICD-10-CM

## 2019-10-13 NOTE — Patient Instructions (Signed)

## 2019-10-14 LAB — CBC WITH DIFFERENTIAL/PLATELET
Absolute Monocytes: 664 cells/uL (ref 200–950)
Basophils Absolute: 92 cells/uL (ref 0–200)
Basophils Relative: 1.1 %
Eosinophils Absolute: 160 cells/uL (ref 15–500)
Eosinophils Relative: 1.9 %
HCT: 43.6 % (ref 38.5–50.0)
Hemoglobin: 15 g/dL (ref 13.2–17.1)
Lymphs Abs: 2335 cells/uL (ref 850–3900)
MCH: 28.9 pg (ref 27.0–33.0)
MCHC: 34.4 g/dL (ref 32.0–36.0)
MCV: 84 fL (ref 80.0–100.0)
MPV: 10.9 fL (ref 7.5–12.5)
Monocytes Relative: 7.9 %
Neutro Abs: 5149 cells/uL (ref 1500–7800)
Neutrophils Relative %: 61.3 %
Platelets: 270 10*3/uL (ref 140–400)
RBC: 5.19 10*6/uL (ref 4.20–5.80)
RDW: 12.7 % (ref 11.0–15.0)
Total Lymphocyte: 27.8 %
WBC: 8.4 10*3/uL (ref 3.8–10.8)

## 2019-10-14 LAB — COMPLETE METABOLIC PANEL WITH GFR
AG Ratio: 2 (calc) (ref 1.0–2.5)
ALT: 36 U/L (ref 9–46)
AST: 23 U/L (ref 10–35)
Albumin: 4.8 g/dL (ref 3.6–5.1)
Alkaline phosphatase (APISO): 113 U/L (ref 35–144)
BUN: 13 mg/dL (ref 7–25)
CO2: 30 mmol/L (ref 20–32)
Calcium: 10.1 mg/dL (ref 8.6–10.3)
Chloride: 103 mmol/L (ref 98–110)
Creat: 1.01 mg/dL (ref 0.70–1.33)
GFR, Est African American: 95 mL/min/{1.73_m2} (ref >=60)
GFR, Est Non African American: 82 mL/min/{1.73_m2} (ref >=60)
Globulin: 2.4 g/dL (calc) (ref 1.9–3.7)
Glucose, Bld: 105 mg/dL — ABNORMAL HIGH (ref 65–99)
Potassium: 3.8 mmol/L (ref 3.5–5.3)
Sodium: 143 mmol/L (ref 135–146)
Total Bilirubin: 0.9 mg/dL (ref 0.2–1.2)
Total Protein: 7.2 g/dL (ref 6.1–8.1)

## 2019-10-14 LAB — INSULIN, RANDOM: Insulin: 8.3 u[IU]/mL

## 2019-10-14 LAB — MAGNESIUM: Magnesium: 2 mg/dL (ref 1.5–2.5)

## 2019-10-14 LAB — LIPID PANEL
Cholesterol: 127 mg/dL (ref <200)
HDL: 48 mg/dL (ref >=40)
LDL Cholesterol (Calc): 65 mg/dL (calc)
Non-HDL Cholesterol (Calc): 79 mg/dL (calc) (ref <130)
Total CHOL/HDL Ratio: 2.6 (calc) (ref <5.0)
Triglycerides: 68 mg/dL (ref <150)

## 2019-10-14 LAB — HEMOGLOBIN A1C
Hgb A1c MFr Bld: 5.9 % of total Hgb — ABNORMAL HIGH (ref <5.7)
Mean Plasma Glucose: 123 (calc)
eAG (mmol/L): 6.8 (calc)

## 2019-10-14 LAB — VITAMIN D 25 HYDROXY (VIT D DEFICIENCY, FRACTURES): Vit D, 25-Hydroxy: 30 ng/mL (ref 30–100)

## 2019-10-14 LAB — TSH: TSH: 2.94 mIU/L (ref 0.40–4.50)

## 2019-11-03 ENCOUNTER — Other Ambulatory Visit: Payer: Self-pay | Admitting: Internal Medicine

## 2019-11-03 DIAGNOSIS — E118 Type 2 diabetes mellitus with unspecified complications: Secondary | ICD-10-CM

## 2019-12-01 ENCOUNTER — Other Ambulatory Visit: Payer: Self-pay | Admitting: Internal Medicine

## 2019-12-01 DIAGNOSIS — E785 Hyperlipidemia, unspecified: Secondary | ICD-10-CM

## 2019-12-01 DIAGNOSIS — E1169 Type 2 diabetes mellitus with other specified complication: Secondary | ICD-10-CM

## 2020-01-15 ENCOUNTER — Other Ambulatory Visit: Payer: Self-pay | Admitting: Internal Medicine

## 2020-01-15 DIAGNOSIS — I10 Essential (primary) hypertension: Secondary | ICD-10-CM

## 2020-01-19 NOTE — Progress Notes (Signed)
   R  E  S  C  H  E  D U  L  E  D  Father died in [place]                                                                                                                                                This very nice 59 y.o. MWM presents for 6 month follow up with HTN, HLD, Pre-Diabetes and Vitamin D Deficiency.       Patient is treated for HTN & BP has been controlled at home. Today's  . Patient has had no complaints of any cardiac type chest pain, palpitations, dyspnea / orthopnea / PND, dizziness, claudication, or dependent edema.      Hyperlipidemia is controlled with diet & meds. Patient denies myalgias or other med SE's. Last Lipids were at goal:  Lab Results  Component Value Date   CHOL 127 10/13/2019   HDL 48 10/13/2019   LDLCALC 65 10/13/2019   TRIG 68 10/13/2019   CHOLHDL 2.6 10/13/2019    Also, the patient has history of T2_NIDDM PreDiabetes and has had no symptoms of reactive hypoglycemia, diabetic polys, paresthesias or visual blurring.  Last A1c was not at goal:  Lab Results  Component Value Date   HGBA1C 5.9 (H) 10/13/2019       Further, the patient also has history of Vitamin D Deficiency and supplements vitamin D without any suspected side-effects. Last vitamin D was not at goal:  Lab Results  Component Value Date   VD25OH 30 10/13/2019

## 2020-01-20 ENCOUNTER — Ambulatory Visit: Payer: PRIVATE HEALTH INSURANCE | Admitting: Internal Medicine

## 2020-02-15 ENCOUNTER — Other Ambulatory Visit: Payer: Self-pay | Admitting: Internal Medicine

## 2020-02-15 DIAGNOSIS — E118 Type 2 diabetes mellitus with unspecified complications: Secondary | ICD-10-CM

## 2020-03-07 ENCOUNTER — Encounter: Payer: Self-pay | Admitting: Adult Health

## 2020-03-07 ENCOUNTER — Other Ambulatory Visit: Payer: Self-pay

## 2020-03-07 ENCOUNTER — Ambulatory Visit (INDEPENDENT_AMBULATORY_CARE_PROVIDER_SITE_OTHER): Payer: PRIVATE HEALTH INSURANCE | Admitting: Adult Health

## 2020-03-07 VITALS — BP 204/124 | HR 86 | Temp 97.5°F | Ht 69.0 in | Wt 188.0 lb

## 2020-03-07 DIAGNOSIS — Z91199 Patient's noncompliance with other medical treatment and regimen due to unspecified reason: Secondary | ICD-10-CM

## 2020-03-07 DIAGNOSIS — Z9119 Patient's noncompliance with other medical treatment and regimen: Secondary | ICD-10-CM

## 2020-03-07 DIAGNOSIS — I10 Essential (primary) hypertension: Secondary | ICD-10-CM | POA: Diagnosis not present

## 2020-03-07 MED ORDER — AMLODIPINE BESYLATE 10 MG PO TABS: ORAL_TABLET | ORAL | 0 refills | Status: DC

## 2020-03-07 NOTE — Progress Notes (Signed)
Assessment and Plan:  Gabriel Hoover was seen today for hypertension and epistaxis.  Diagnoses and all orders for this visit:  Severe hypertension Med noncompliance Nose bleed suspect secondary to severe htn; fully resolved at time of evaluation HE DECLINED ED EVALUATION  He is admittedly very non-compliant with medications, also needle phobia He feels at baseline today  Today he reports this is a "wake up call" - he plans to improve compliance with med adherence and BP/glucose management Reviewed meds together in detail Will restart previous medications; however on review inadequate control, need to increase beyond 1/2 tab of losartan and amlodipine Patient had losartan/HCTZ with him today, only took 1/2 tab this AM, had him take additional 1/2 tab while in office Sent in amlodipine to restart taking tonight Discussed plan to slowly taper up to taking whole tab of each Monitor blood pressure at home;   Continue DASH diet.   Reminder to go to the ER if any CP, SOB, nausea, dizziness, severe HA, changes vision/speech, left arm numbness and tingling and jaw pain. Close follow up in 10-14 days or sooner if BPs not immediately improving with resuming meds. He will call to report BPs for the next several days until BPs are at minimum <150/90 -     amLODipine (NORVASC) 10 MG tablet; Take 1/2 tablet at Bedtime for BP  Further disposition pending results of labs. Discussed med's effects and SE's.   Over 15 minutes of exam, counseling, chart review, and critical decision making was performed.   Future Appointments  Date Time Provider [place]  03/18/2020  8:45 AM Liane Comber, NP [facility] None  04/26/2020 10:30 AM Unk Pinto, MD [facility] None    ------------------------------------------------------------------------------------------------------------------  HPI BP (!) 204/124   Pulse 86   Temp (!) 97.5 F (36.4 C)   Ht 5\' 9"  (1.753 m)   Wt 188 lb (85.3 kg)   SpO2 97%    BMI 27.76 kg/m   59 y.o.male with hx of HTN and CVA presents for evaluation due to acutely elevated BP; he reports this AM had an acute nose bleed, has never had one before, called 911 due to severity and thought he might bleed up, they were able to hold pressure and stopped nose bleed, but checked BP and was 210/120, patient declined going to ED due to no headache, vision changes, CP, dyspnea.   He admits has been out of amlodipine for several days, thought this was for sugars (which remain well controlled) so wasn't prioritizing getting refilled. He admits he doesn't check BPs at home. He reports has been taking 1/2 tab of losartan/hctz 100/25 in AM, 1/2 tab of amlodipine in PM. On review, BPs have remained well above goal, 150s/90s with some labile higher values. Historically patient has been very resistant to adding or increasing meds stating he feels fine off of meds, feels bad when BP is lower. We have discussed slow taper up plan but with limited success due to non-compliance.   Past Medical History:  Diagnosis Date  . Anxiety   . Hyperlipidemia   . Hypertension   . Migraines   . Prediabetes   . Vitamin D deficiency      Allergies  Allergen Reactions  . Atenolol Other (See Comments)    Made tired    Current Outpatient Medications on File Prior to Visit  Medication Sig  . aspirin EC 81 MG EC tablet Take 1 tablet (81 mg total) by mouth daily.  Marland Kitchen atorvastatin (LIPITOR) 80 MG tablet Take  1 tablet Daily for Cholesterol  . glipiZIDE (GLUCOTROL) 5 MG tablet Take 1/2 to 1 tablet 2 x day with Meals for  Diabetes  . losartan-hydrochlorothiazide (HYZAAR) 100-25 MG tablet Take 1/2 to 1 tablet Daily for BP  . metFORMIN (GLUCOPHAGE-XR) 500 MG 24 hr tablet Take 1 to 2 tablets 2 x /day withMeals for Diabetes  . Multiple Vitamins-Minerals (MULTIVITAMIN ADULTS 50+ PO) Take by mouth daily.   No current facility-administered medications on file prior to visit.    ROS: all negative except above.    Physical Exam:  BP (!) 204/124   Pulse 86   Temp (!) 97.5 F (36.4 C)   Ht 5\' 9"  (1.753 m)   Wt 188 lb (85.3 kg)   SpO2 97%   BMI 27.76 kg/m   General Appearance: Well nourished, in no apparent distress. Eyes: PERRLA, EOMs, conjunctiva no swelling or erythema Sinuses: No Frontal/maxillary tenderness ENT/Mouth: Ext aud canals clear, TMs without erythema, bulging. No erythema, swelling, or exudate on post pharynx.  Tonsils not swollen or erythematous. Hearing normal.  Neck: Supple, thyroid normal.  Respiratory: Respiratory effort normal, BS equal bilaterally without rales, rhonchi, wheezing or stridor.  Cardio: RRR with no MRGs. Brisk peripheral pulses without edema.  Abdomen: Soft, + BS.  Non tender, no guarding, rebound, hernias, masses. Lymphatics: Non tender without lymphadenopathy.  Musculoskeletal: Full ROM, 5/5 strength, normal gait.  Skin: Warm, dry without rashes, lesions, ecchymosis.  Neuro: Cranial nerves intact. Normal muscle tone, no cerebellar symptoms. Sensation intact.  Psych: Awake and oriented X 3, normal affect, Insight and Judgment appropriate.     Izora Ribas, NP 1:04 PM [facility]

## 2020-03-07 NOTE — Patient Instructions (Signed)
Restart 1/2 tab amlodipine at night   Keep 1/2 tab losartan/HCTZ in the morning   Monitor blood pressure twice daily and keep a log  In 3-5 days, if still running 150/90+ then increase amlodipine in the evening to a whole tab  In 1 week if still running 150/90+ then increase losartan/HCTZ to whole tab  Please call back tomorrow after you check your blood pressure 2 hours after taking meds      HYPERTENSION INFORMATION  Monitor your blood pressure at home, please keep a record and bring that in with you to your next office visit.   Go to the ER if any CP, SOB, nausea, dizziness, severe HA, changes vision/speech  Testing/Procedures: HOW TO TAKE YOUR BLOOD PRESSURE:  Rest 5 minutes before taking your blood pressure.  Dont smoke or drink caffeinated beverages for at least 30 minutes before.  Take your blood pressure before (not after) you eat.  Sit comfortably with your back supported and both feet on the floor (dont cross your legs).  Elevate your arm to heart level on a table or a desk.  Use the proper sized cuff. It should fit smoothly and snugly around your bare upper arm. There should be enough room to slip a fingertip under the cuff. The bottom edge of the cuff should be 1 inch above the crease of the elbow.  Due to a recent study, SPRINT, we have changed our goal for the systolic or top blood pressure number. Ideally we want your top number at 120.  In the [facility] Trial, 5000 people were randomized to a goal BP of 120 and 5000 people were randomized to a goal BP of less than 140. The patients with the goal BP at 120 had LESS DEMENTIA, LESS HEART ATTACKS, AND LESS STROKES, AS WELL AS OVERALL DECREASED MORTALITY OR DEATH RATE.   There was another study that showed taking your blood pressure medications at night decrease cardiovascular events.  However if you are on a fluid pill, please take this in the morning.   If you are willing, our goal BP is the top number of 120.   Your most recent BP: BP: (!) 204/124   Take your medications faithfully as instructed. Maintain a healthy weight. Get at least 150 minutes of aerobic exercise per week. Minimize salt intake. Minimize alcohol intake  DASH Eating Plan DASH stands for "Dietary Approaches to Stop Hypertension." The DASH eating plan is a healthy eating plan that has been shown to reduce high blood pressure (hypertension). Additional health benefits may include reducing the risk of type 2 diabetes mellitus, heart disease, and stroke. The DASH eating plan may also help with weight loss. WHAT DO I NEED TO KNOW ABOUT THE DASH EATING PLAN? For the DASH eating plan, you will follow these general guidelines:  Choose foods with a percent daily value for sodium of less than 5% (as listed on the food label).  Use salt-free seasonings or herbs instead of table salt or sea salt.  Check with your health care provider or pharmacist before using salt substitutes.  Eat lower-sodium products, often labeled as "lower sodium" or "no salt added."  Eat fresh foods.  Eat more vegetables, fruits, and low-fat dairy products.  Choose whole grains. Look for the word "whole" as the first word in the ingredient list.  Choose fish and skinless chicken or [place] more often than red meat. Limit fish, poultry, and meat to 6 oz (170 g) each day.  Limit sweets, desserts, sugars,  and sugary drinks.  Choose heart-healthy fats.  Limit cheese to 1 oz (28 g) per day.  Eat more home-cooked food and less restaurant, buffet, and fast food.  Limit fried foods.  Cook foods using methods other than frying.  Limit canned vegetables. If you do use them, rinse them well to decrease the sodium.  When eating at a restaurant, ask that your food be prepared with less salt, or no salt if possible. WHAT FOODS CAN I EAT? Seek help from a dietitian for individual calorie needs. Grains Whole grain or whole wheat bread. Brown rice. Whole grain or  whole wheat pasta. Quinoa, bulgur, and whole grain cereals. Low-sodium cereals. Corn or whole wheat flour tortillas. Whole grain cornbread. Whole grain crackers. Low-sodium crackers. Vegetables Fresh or frozen vegetables (raw, steamed, roasted, or grilled). Low-sodium or reduced-sodium tomato and vegetable juices. Low-sodium or reduced-sodium tomato sauce and paste. Low-sodium or reduced-sodium canned vegetables.  Fruits All fresh, canned (in natural juice), or frozen fruits. Meat and Other Protein Products Ground beef (85% or leaner), grass-fed beef, or beef trimmed of fat. Skinless chicken or [place]. Ground chicken or [place]. Pork trimmed of fat. All fish and seafood. Eggs. Dried beans, peas, or lentils. Unsalted nuts and seeds. Unsalted canned beans. Dairy Low-fat dairy products, such as skim or 1% milk, 2% or reduced-fat cheeses, low-fat ricotta or cottage cheese, or plain low-fat yogurt. Low-sodium or reduced-sodium cheeses. Fats and Oils Tub margarines without trans fats. Light or reduced-fat mayonnaise and salad dressings (reduced sodium). Avocado. Safflower, olive, or canola oils. Natural peanut or almond butter. Other Unsalted popcorn and pretzels. The items listed above may not be a complete list of recommended foods or beverages. Contact your dietitian for more options. WHAT FOODS ARE NOT RECOMMENDED? Grains White bread. White pasta. White rice. Refined cornbread. Bagels and croissants. Crackers that contain trans fat. Vegetables Creamed or fried vegetables. Vegetables in a cheese sauce. Regular canned vegetables. Regular canned tomato sauce and paste. Regular tomato and vegetable juices. Fruits Dried fruits. Canned fruit in light or heavy syrup. Fruit juice. Meat and Other Protein Products Fatty cuts of meat. Ribs, chicken wings, bacon, sausage, bologna, salami, chitterlings, fatback, hot dogs, bratwurst, and packaged luncheon meats. Salted nuts and seeds. Canned beans with  salt. Dairy Whole or 2% milk, cream, half-and-half, and cream cheese. Whole-fat or sweetened yogurt. Full-fat cheeses or blue cheese. Nondairy creamers and whipped toppings. Processed cheese, cheese spreads, or cheese curds. Condiments Onion and garlic salt, seasoned salt, table salt, and sea salt. Canned and packaged gravies. Worcestershire sauce. Tartar sauce. Barbecue sauce. Teriyaki sauce. Soy sauce, including reduced sodium. Steak sauce. Fish sauce. Oyster sauce. Cocktail sauce. Horseradish. Ketchup and mustard. Meat flavorings and tenderizers. Bouillon cubes. Hot sauce. Tabasco sauce. Marinades. Taco seasonings. Relishes. Fats and Oils Butter, stick margarine, lard, shortening, ghee, and bacon fat. Coconut, palm kernel, or palm oils. Regular salad dressings. Other Pickles and olives. Salted popcorn and pretzels. The items listed above may not be a complete list of foods and beverages to avoid. Contact your dietitian for more information. WHERE CAN I FIND MORE INFORMATION? National Heart, Lung, and Blood Institute: travelstabloid.com Document Released: 07/19/2011 Document Revised: 12/14/2013 Document Reviewed: 06/03/2013 [facility] Patient Information 2015 [place], [place]. This information is not intended to replace advice given to you by your health care provider. Make sure you discuss any questions you have with your health care provider.  \

## 2020-03-08 ENCOUNTER — Telehealth: Payer: Self-pay

## 2020-03-08 NOTE — Telephone Encounter (Signed)
Patient states that at 2am, BP was 182/102.  Around 7:30, BP was 177/102. Took one whole tablet of the Losartan. Has a slight headache.

## 2020-03-09 ENCOUNTER — Telehealth: Payer: Self-pay | Admitting: Adult Health

## 2020-03-09 ENCOUNTER — Emergency Department (HOSPITAL_COMMUNITY)
Admission: EM | Admit: 2020-03-09 | Discharge: 2020-03-09 | Disposition: A | Discharge status: Home / Self Care | Payer: PRIVATE HEALTH INSURANCE | Attending: Emergency Medicine | Admitting: Emergency Medicine

## 2020-03-09 DIAGNOSIS — Z7982 Long term (current) use of aspirin: Secondary | ICD-10-CM | POA: Insufficient documentation

## 2020-03-09 DIAGNOSIS — R55 Syncope and collapse: Secondary | ICD-10-CM | POA: Diagnosis not present

## 2020-03-09 DIAGNOSIS — Z7984 Long term (current) use of oral hypoglycemic drugs: Secondary | ICD-10-CM | POA: Insufficient documentation

## 2020-03-09 DIAGNOSIS — Z79899 Other long term (current) drug therapy: Secondary | ICD-10-CM | POA: Insufficient documentation

## 2020-03-09 DIAGNOSIS — R7303 Prediabetes: Secondary | ICD-10-CM | POA: Insufficient documentation

## 2020-03-09 DIAGNOSIS — I10 Essential (primary) hypertension: Secondary | ICD-10-CM | POA: Insufficient documentation

## 2020-03-09 DIAGNOSIS — R040 Epistaxis: Secondary | ICD-10-CM | POA: Diagnosis present

## 2020-03-09 LAB — ABO/RH: ABO/RH(D): O POS

## 2020-03-09 LAB — CBC
HCT: 43.2 % (ref 39.0–52.0)
Hemoglobin: 14.7 g/dL (ref 13.0–17.0)
MCH: 28.5 pg (ref 26.0–34.0)
MCHC: 34 g/dL (ref 30.0–36.0)
MCV: 83.9 fL (ref 80.0–100.0)
Platelets: 337 10*3/uL (ref 150–400)
RBC: 5.15 MIL/uL (ref 4.22–5.81)
RDW: 12.5 % (ref 11.5–15.5)
WBC: 16.8 10*3/uL — ABNORMAL HIGH (ref 4.0–10.5)
nRBC: 0 % (ref 0.0–0.2)

## 2020-03-09 LAB — COMPREHENSIVE METABOLIC PANEL
ALT: 31 U/L (ref 0–44)
AST: 20 U/L (ref 15–41)
Albumin: 4.1 g/dL (ref 3.5–5.0)
Alkaline Phosphatase: 99 U/L (ref 38–126)
Anion gap: 14 (ref 5–15)
BUN: 22 mg/dL — ABNORMAL HIGH (ref 6–20)
CO2: 24 mmol/L (ref 22–32)
Calcium: 9.8 mg/dL (ref 8.9–10.3)
Chloride: 100 mmol/L (ref 98–111)
Creatinine, Ser: 1.37 mg/dL — ABNORMAL HIGH (ref 0.61–1.24)
GFR calc Af Amer: >60 mL/min (ref >60)
GFR calc non Af Amer: 56 mL/min — ABNORMAL LOW (ref >60)
Glucose, Bld: 287 mg/dL — ABNORMAL HIGH (ref 70–99)
Potassium: 3.4 mmol/L — ABNORMAL LOW (ref 3.5–5.1)
Sodium: 138 mmol/L (ref 135–145)
Total Bilirubin: 1.5 mg/dL — ABNORMAL HIGH (ref 0.3–1.2)
Total Protein: 7.1 g/dL (ref 6.5–8.1)

## 2020-03-09 LAB — TYPE AND SCREEN
ABO/RH(D): O POS
Antibody Screen: NEGATIVE

## 2020-03-09 LAB — HEMOGLOBIN AND HEMATOCRIT, BLOOD
HCT: 38.2 % — ABNORMAL LOW (ref 39.0–52.0)
Hemoglobin: 13.4 g/dL (ref 13.0–17.0)

## 2020-03-09 LAB — PROTIME-INR
INR: 1 (ref 0.8–1.2)
Prothrombin Time: 13 seconds (ref 11.4–15.2)

## 2020-03-09 MED ORDER — OXYMETAZOLINE HCL 0.05 % NA SOLN
2.0000 | Freq: Once | NASAL | Status: AC
  Administered 2020-03-09: 2 via NASAL
  Filled 2020-03-09: qty 30

## 2020-03-09 NOTE — Telephone Encounter (Signed)
Left message on voice mail  to call back

## 2020-03-09 NOTE — ED Notes (Signed)
Patient Alert and oriented to baseline. Stable and ambulatory to baseline. Patient verbalized understanding of the discharge instructions.  Patient belongings were taken by the patient.   

## 2020-03-09 NOTE — Discharge Instructions (Signed)
Continue medications as previously prescribed.  Use Afrin nasal spray, 2 sprays in each nostril every 4 hours for the next 3 days.  Follow-up with ear, nose, and throat in the next few days.  The contact information for [facility] has been provided in this discharge summary for you to call and make these arrangements.  If your bleeding recurs, hold direct pressure for 15 minutes and tilt your head forward.  If this does not improve the bleeding, return to the ER to be reevaluated.

## 2020-03-09 NOTE — ED Triage Notes (Signed)
Pt arrives with epistaxis since Monday. In triage pt with hematemesis and syncopal event. Pt denies blood thinners.

## 2020-03-09 NOTE — ED Provider Notes (Signed)
Gabriel Hoover   CSN: [id number] Arrival date & time: 03/09/20  1432     History Chief Complaint  Patient presents with  . Epistaxis  . Hematemesis  . Loss of Consciousness    Gabriel Hoover is a 59 y.o. male.  Patient is a 59 year old male with past medical history of hypertension, type 2 diabetes, hyperlipidemia.  He presents today for evaluation of nosebleed.  This is occurred intermittently for the past 3 days.  It began bleeding again this afternoon significantly.  Bleeding did seem to stop shortly after arriving here to the ER.  Patient denies any injury or trauma.  Patient does take an aspirin, but does not take other blood thinners.  The history is provided by the patient.  Epistaxis Location:  Bilateral Severity:  Moderate Duration:  3 days Timing:  Intermittent Progression:  Waxing and waning Chronicity:  New Relieved by:  Applying pressure Worsened by:  Nothing Ineffective treatments:  None tried Associated symptoms: blood in oropharynx and syncope   Loss of Consciousness      Past Medical History:  Diagnosis Date  . Anxiety   . Hyperlipidemia   . Hypertension   . Migraines   . Prediabetes   . Vitamin D deficiency     Patient Active Problem List   Diagnosis Date Noted  . Acute cerebrovascular accident (CVA) ([place]) 02/28/2019  . Amaurosis fugax 02/17/2019  . Overweight (BMI 25.0-29.9) 05/19/2015  . Poor compliance with medication 05/19/2015  . Medication management 05/14/2014  . Severe needle phobia   . Hyperlipidemia   . Hypertension   . T2_NIDDM ([place])   . Vitamin D deficiency     Past Surgical History:  Procedure Laterality Date  . BUBBLE STUDY  03/02/2019   Procedure: BUBBLE STUDY;  Surgeon: Thayer Headings, MD;  Location: [facility];  Service: Cardiovascular;;  . TEE WITHOUT CARDIOVERSION N/A 03/02/2019   Procedure: TRANSESOPHAGEAL ECHOCARDIOGRAM (TEE);  Surgeon: Acie Fredrickson Wonda Cheng, MD;   Location: [facility] ENDOSCOPY;  Service: Cardiovascular;  Laterality: N/A;       Family History  Problem Relation Age of Onset  . Hyperlipidemia Father   . Heart disease Father     Social History   Tobacco Use  . Smoking status: Never Smoker  . Smokeless tobacco: Never Used  Substance Use Topics  . Alcohol use: No  . Drug use: No    Home Medications Prior to Admission medications   Medication Sig Start Date End Date Taking? Authorizing Provider  amLODipine (NORVASC) 10 MG tablet Take 1/2 tablet at Bedtime for BP 03/07/20   Liane Comber, NP  aspirin EC 81 MG EC tablet Take 1 tablet (81 mg total) by mouth daily. 02/19/19   Lars Mage, MD  atorvastatin (LIPITOR) 80 MG tablet Take 1 tablet Daily for Cholesterol 12/01/19   Unk Pinto, MD  glipiZIDE (GLUCOTROL) 5 MG tablet Take 1/2 to 1 tablet 2 x day with Meals for  Diabetes 02/15/20   Unk Pinto, MD  losartan-hydrochlorothiazide [facility]) 100-25 MG tablet Take 1/2 to 1 tablet Daily for BP 01/15/20   Unk Pinto, MD  metFORMIN (GLUCOPHAGE-XR) 500 MG 24 hr tablet Take 1 to 2 tablets 2 x /day withMeals for Diabetes 07/02/19   Unk Pinto, MD  Multiple Vitamins-Minerals (MULTIVITAMIN ADULTS 50+ PO) Take by mouth daily.    [provider]    Allergies    Atenolol  Review of Systems   Review of Systems  HENT: Positive for nosebleeds.  Cardiovascular: Positive for syncope.  All other systems reviewed and are negative.   Physical Exam Updated Vital Signs BP (!) 90/58   Pulse 82   Resp (!) 24   SpO2 97%   Physical Exam Vitals and nursing Hoover reviewed.  Constitutional:      General: He is not in acute distress.    Appearance: He is well-developed. He is not diaphoretic.     Comments: Patient is somewhat pale appearing.  He is in no acute distress.  Bleeding is controlled.  HENT:     Head: Normocephalic and atraumatic.     Nose:     Comments: There is dried blood in the nares with no active  bleeding. Cardiovascular:     Rate and Rhythm: Normal rate and regular rhythm.     Heart sounds: No murmur heard.  No friction rub.  Pulmonary:     Effort: Pulmonary effort is normal. No respiratory distress.     Breath sounds: Normal breath sounds. No wheezing or rales.  Abdominal:     General: Bowel sounds are normal. There is no distension.     Palpations: Abdomen is soft.     Tenderness: There is no abdominal tenderness.  Musculoskeletal:        General: Normal range of motion.     Cervical back: Normal range of motion and neck supple.  Skin:    General: Skin is warm and dry.  Neurological:     Mental Status: He is alert and oriented to person, place, and time.     Coordination: Coordination normal.     ED Results / Procedures / Treatments   Labs (all labs ordered are listed, but only abnormal results are displayed) Labs Reviewed  CBC - Abnormal; Notable for the following components:      Result Value   WBC 16.8 (*)    All other components within normal limits  COMPREHENSIVE METABOLIC PANEL  PROTIME-INR  POC OCCULT BLOOD, ED  TYPE AND SCREEN  ABO/RH    EKG None  Radiology No results found.  Procedures Procedures (including critical care time)  Medications Ordered in ED Medications - No data to display  ED Course  I have reviewed the triage vital signs and the nursing notes.  Pertinent labs & imaging results that were available during my care of the patient were reviewed by me and considered in my medical decision making (see chart for details).    MDM Rules/Calculators/A&P  Patient is a male presenting with nosebleed.  Patient states that he has been bleeding intermittently from both nares since Monday.  It became much worse this evening.  This began in the absence of any injury or trauma.  His nosebleed resolved with pressure shortly after arriving to the ER.  He had what sounds like a syncopal episode in triage that I suspect was vasovagal in nature.   His hemoglobin initially was 14.7 upon presentation and 13.4 several hours after bleeding resolved.  He appears hemodynamically stable and appropriate for discharge.  His nares were sprayed with Afrin with no further bleeding.  No packing necessary due to spontaneous resolution.  Patient will be referred to ENT if he has ongoing issues.  Final Clinical Impression(s) / ED Diagnoses Final diagnoses:  None    Rx / DC Orders ED Discharge Orders    None       Veryl Speak, MD 03/09/20 [id number]

## 2020-03-09 NOTE — Telephone Encounter (Signed)
Patient currently in the ED.

## 2020-03-16 IMAGING — MR MRI HEAD WITHOUT CONTRAST
10 of 12 series · 32 of 48 positions shown · non-contrast
Comparison: Comparison made with prior CT from earlier the same day
as well as previous MRI from 02/17/2019.

CLINICAL DATA: Initial evaluation for

EXAM:
MRI HEAD WITHOUT CONTRAST
MRA HEAD WITHOUT CONTRAST
TECHNIQUE: Multiplanar, multiecho pulse sequences of the brain and surrounding
structures were obtained without intravenous contrast. Angiographic
images of the head were obtained using MRA technique without
contrast.

[Series 5: DWI · axial · 3.0mm · 0.88mm/px · z∈[+5,+143]mm · 6 of 96 slices shown (1 of 4)]
[im 1/96]
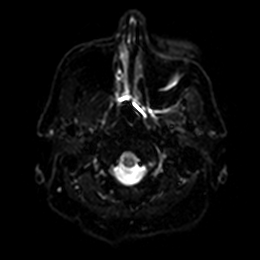
[im 20/96]
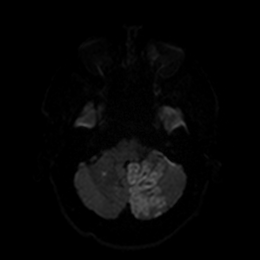
[im 39/96]
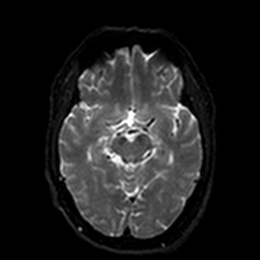
[im 58/96]
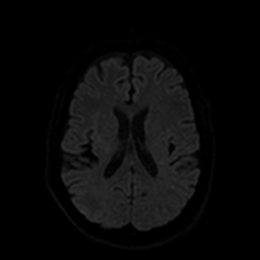
[im 77/96]
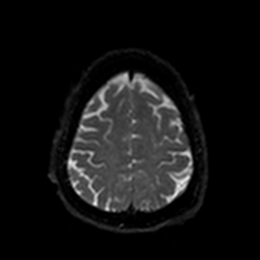
[im 96/96]
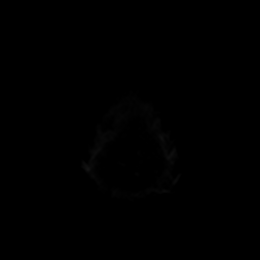

[Series 6: DWI · axial · 3.0mm · 0.88mm/px · z∈[+5,+143]mm · 3 of 48 slices shown (2 of 4)]
[im 1/48]
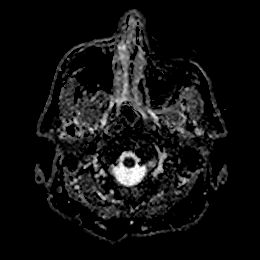
[im 24/48]
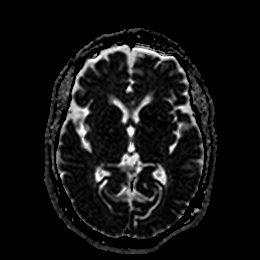
[im 48/48]
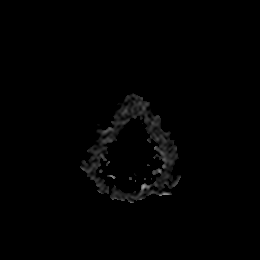

[Series 7: DWI · coronal · 4.0mm · 0.88mm/px · 5 of 72 slices shown (3 of 4)]
[im 1/72]
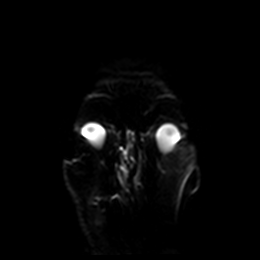
[im 18/72]
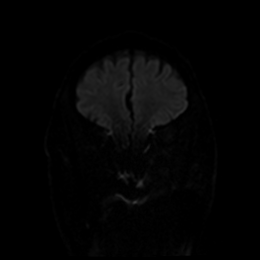
[im 36/72]
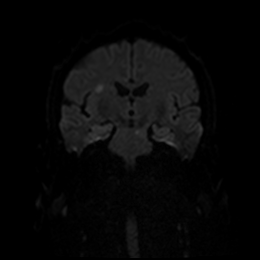
[im 54/72]
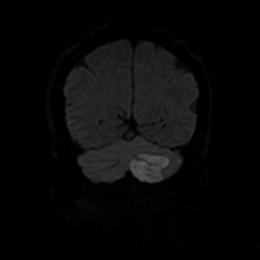
[im 72/72]
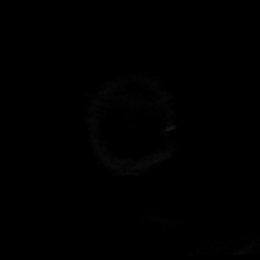

[Series 8: DWI · coronal · 4.0mm · 0.88mm/px · 2 of 36 slices shown (4 of 4)]
[im 1/36]
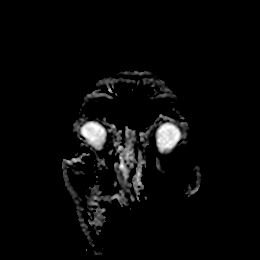
[im 36/36]
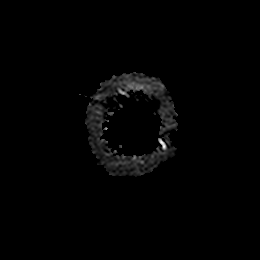

[Series 9: T1 · sagittal · 5.0mm · 0.75mm/px · 2 of 25 slices shown]
[im 1/25]
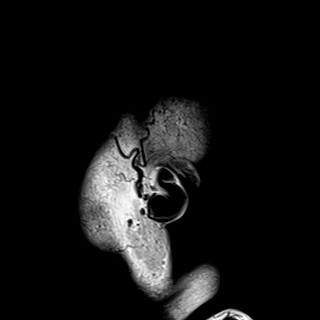
[im 25/25]
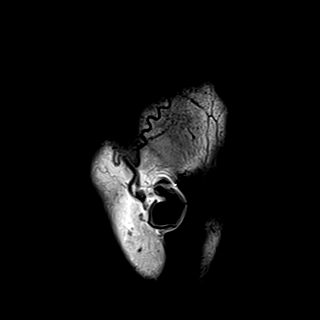

[Series 10: T2 · axial · 5.0mm · 0.72mm/px · z∈[+6,+146]mm · 2 of 25 slices shown (1 of 2)]
[im 1/25]
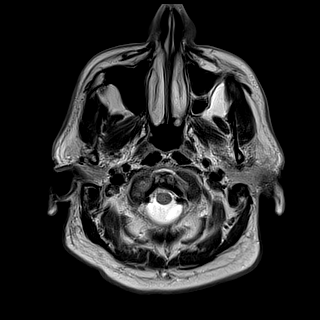
[im 25/25]
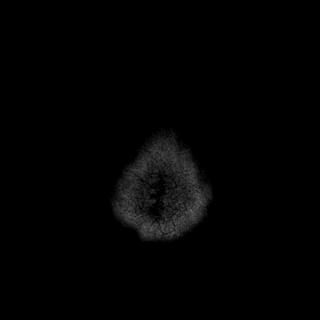

[Series 11: FLAIR · axial · 5.0mm · 0.45mm/px · z∈[+3,+144]mm · 2 of 25 slices shown]
[im 1/25]
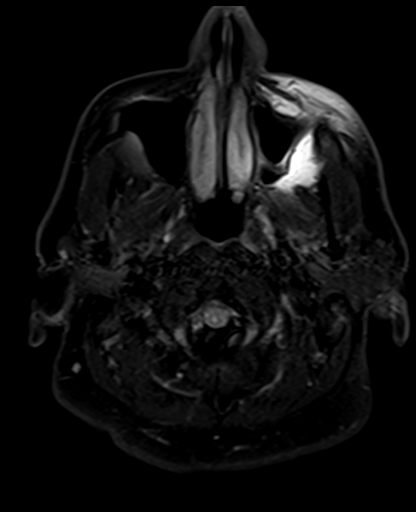
[im 25/25]
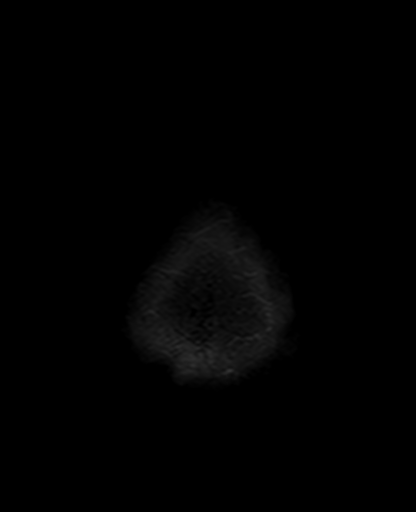

[Series 13: pha_images · axial · 3.0mm · 0.90mm/px · z∈[-15,+155]mm · 4 of 59 slices shown]
[im 1/59]
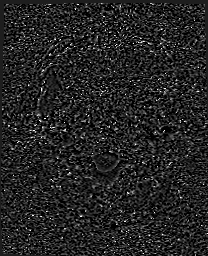
[im 20/59]
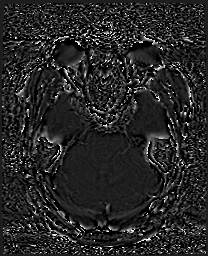
[im 39/59]
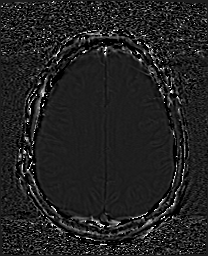
[im 59/59]
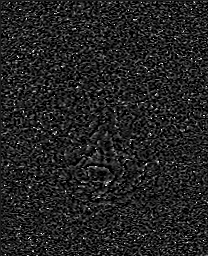

[Series 14: swi_images · axial · 3.0mm · 0.90mm/px · z∈[-15,+158]mm · 4 of 60 slices shown]
[im 1/60]
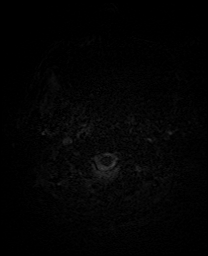
[im 20/60]
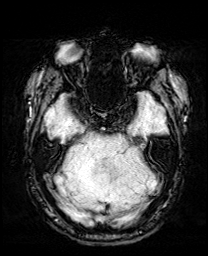
[im 40/60]
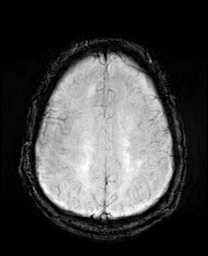
[im 60/60]
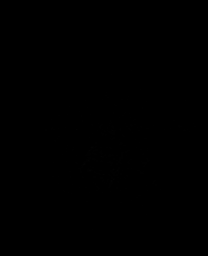

[Series 17: T2 · coronal · 5.0mm · 0.34mm/px · 2 of 31 slices shown (2 of 2)]
[im 1/31]
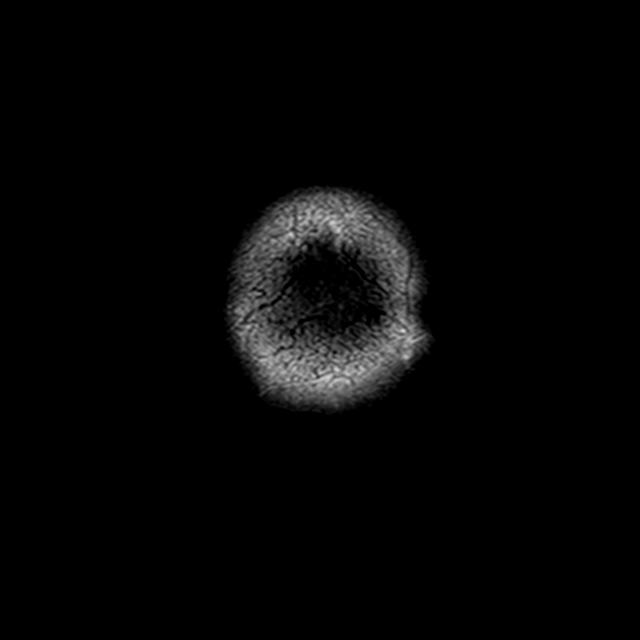
[im 31/31]
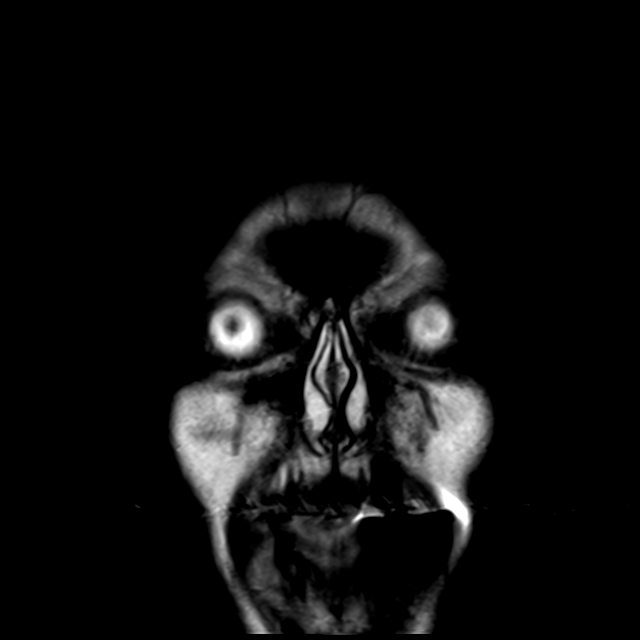

[32 of 48 positions shown; findings below may reference images not displayed]

FINDINGS: MRI HEAD FINDINGS

Brain: Generalized age-related cerebral atrophy with mild to
moderate chronic microvascular ischemic disease.

Confluent restricted diffusion involving the inferior left
cerebellum compatible with left PICA territory infarct, early
subacute in appearance. Associated mild localized edema, with mild
mass effect on the adjacent fourth ventricular outflow tract. Fourth
ventricle remains patent at this time with no evidence for
obstructive hydrocephalus. Associated prominent petechial hemorrhage
on SWI sequence without frank hemorrhagic transformation. Multiple
additional scattered predominantly subcentimeter acute to early
subacute ischemic infarcts seen elsewhere throughout the bilateral
cerebral hemispheres as well as the right cerebellum. Largest of
these foci within the right cerebral hemisphere measures 1 cm and is
positioned within the mid right corona radiata (series 5, image 79).
Largest focus within the left cerebral hemisphere involves the left
periatrial white matter and measures 8 mm (series 5, image 72).
Punctate 5 mm infarct within the mid right cerebellum (series 5,
image 58). No made of a 6 mm ischemic infarct involving the left
paramedian pons (series 5, image 65). No associated mass effect or
hemorrhage about these additional infarcts. Gray-white matter
differentiation otherwise maintained. Additional single chronic
microhemorrhage noted within the right cerebellum.

No mass lesion or midline shift. No hydrocephalus. No extra-axial
fluid collection. Pituitary gland suprasellar region within normal
limits. Midline structures intact.

Vascular: Major intracranial vascular flow voids are maintained.

Skull and upper cervical spine: Craniocervical junction within
normal limits. Upper cervical spine normal. Bone marrow signal
intensity within normal limits. No scalp soft tissue abnormality.

Sinuses/Orbits: Globes orbital soft tissues demonstrate no acute
finding. Axial myopia noted. Mild scattered mucosal thickening
within the ethmoidal air cells and maxillary sinuses. Paranasal
sinuses are otherwise clear. No mastoid effusion. Inner ear
structures grossly normal.

Other: None.

MRA HEAD FINDINGS

ANTERIOR CIRCULATION:

Examination mildly degraded by motion artifact.

Distal cervical segments of the internal carotid arteries are patent
with symmetric antegrade flow. Petrous segments patent bilaterally
without flow-limiting stenosis. Scattered atheromatous irregularity
throughout the cavernous/supraclinoid ICAs bilaterally. No
significant stenosis on the right. There is moderate multifocal
narrowing involving the cavernous/supraclinoid left ICA (up to
approximately 50%). ICA termini well perfused. A1 segments patent
bilaterally. Normal anterior communicating artery. Anterior cerebral
arteries patent to their distal aspects without stenosis. No M1
stenosis or occlusion. Normal MCA bifurcations. Distal MCA branches
well perfused and symmetric. Distal small vessel atheromatous
irregularity noted.

POSTERIOR CIRCULATION:

Vertebral arteries patent to the vertebrobasilar junction without
stenosis. Left vertebral artery slightly dominant. Right PICA
patent. Left PICA not visualized, suspected to be occluded. Proximal
and mid basilar artery widely patent. Moderate focal stenosis noted
at the basilar tip (series 5148, image 11). Superior cerebral
arteries patent bilaterally. Both of the posterior cerebral arteries
primarily supplied via the basilar. PCAs demonstrate mild
atheromatous irregularity but are well perfused to their distal
aspects without high-grade stenosis.

No intracranial aneurysm.
IMPRESSION: MRI HEAD IMPRESSION:

1. Moderate-size confluent early subacute left PICA territory
infarct involving the inferior left cerebellum. Associated edema
with mild mass effect on the adjacent fourth ventricular outflow
tract. No associated obstructive hydrocephalus at this time.
Associated prominent petechial hemorrhage without frank hemorrhagic
transformation.
2. Additional scattered predominantly subcentimeter acute to early
subacute ischemic infarcts involving the bilateral cerebral
hemispheres, brainstem, and right cerebellum. Given the various
vascular distributions involved, a central thromboembolic source is
suspected.
3. Underlying mild to moderate chronic microvascular ischemic
disease.

MRA HEAD IMPRESSION:

1. Nonvisualization of the left PICA, suspected to be occluded given
the left PICA territory infarct.
2. Moderate atherosclerotic change involving the left carotid siphon
with associated moderate multifocal narrowing (approximate up to
50%).
3. Focal moderate basilar tip stenosis.
4. Additional scattered small vessel atheromatous irregularity
elsewhere throughout the intracranial circulation. No other
hemodynamically significant or correctable stenosis.

## 2020-03-16 IMAGING — CT CT HEAD WITHOUT CONTRAST
3 of 4 series · 13 of 47 positions shown, 15 images · non-contrast
Comparison: MRI brain 02/17/2019. CTA head and neck including CT
brain 02/17/2019.

CLINICAL DATA: One-week history of positional vertigo.

EXAM:
CT HEAD WITHOUT CONTRAST
TECHNIQUE: Contiguous axial images were obtained from the base of the skull
through the vertex without intravenous contrast.

[Series 3: head wo · axial · 0.49mm/px · z∈[-167,-47]mm · 7 of 34 slices shown, 9 images]
[im 5/34  brain]
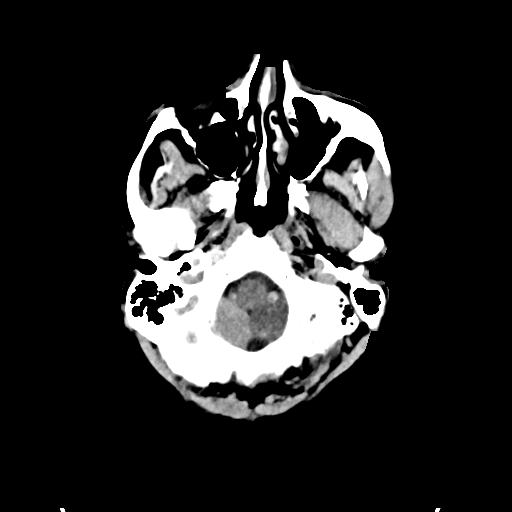
[im 5/34  bone]
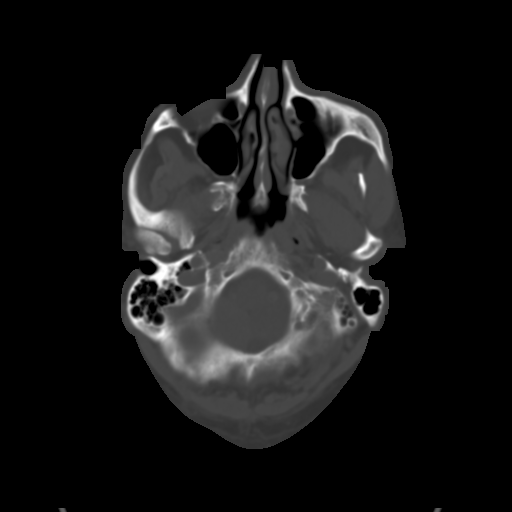
[im 9/34  brain]
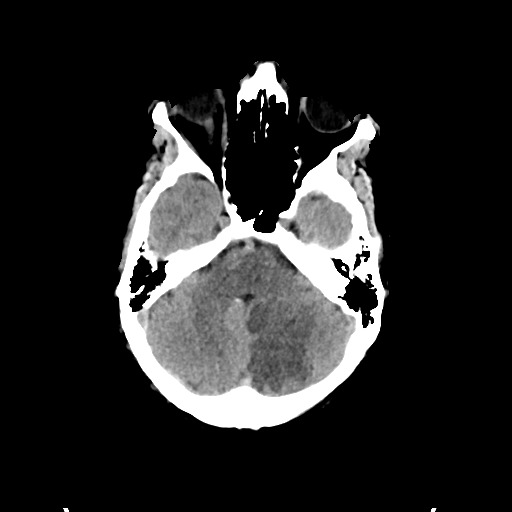
[im 13/34  brain]
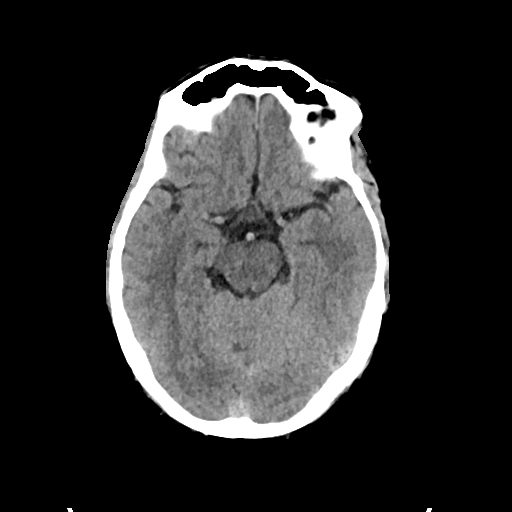
[im 17/34  brain]
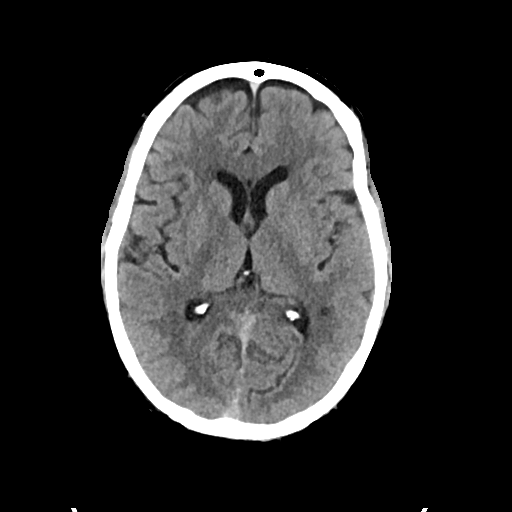
[im 21/34  brain]
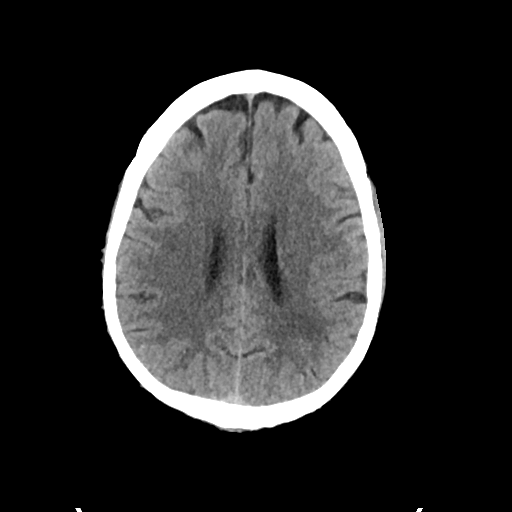
[im 21/34  bone]
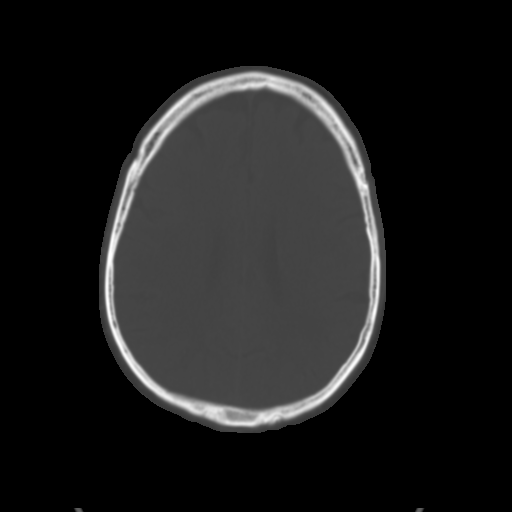
[im 25/34  brain]
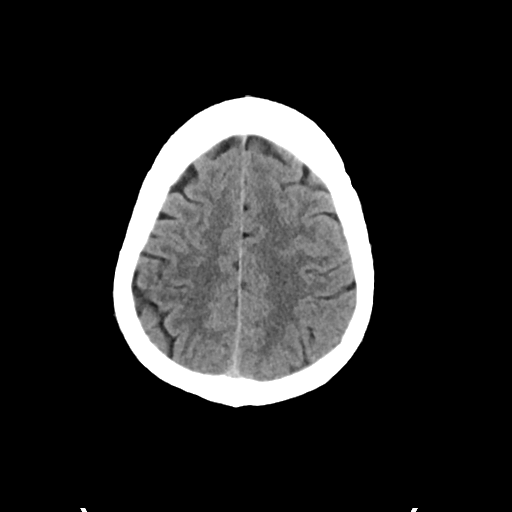
[im 29/34  brain]
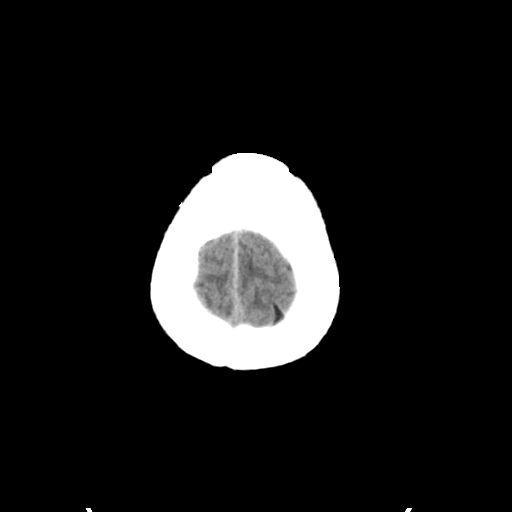

[Series 5: cor soft · coronal · 0.33mm/px · 3 of 73 slices shown]
[im 25/73  brain]
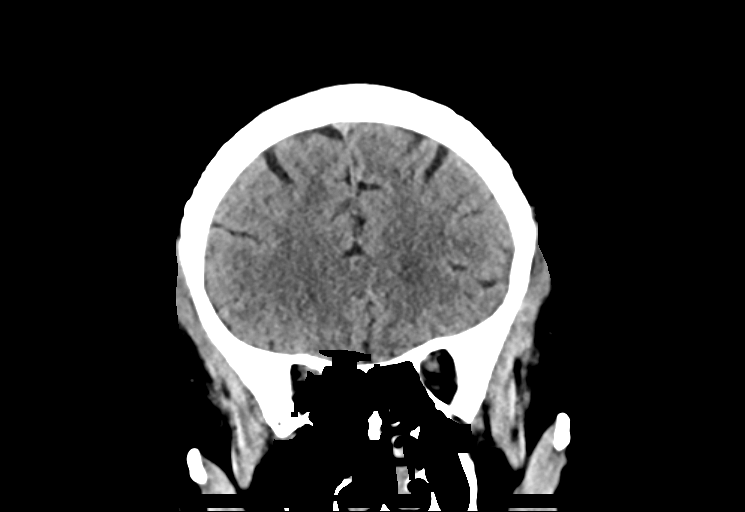
[im 33/73  brain]
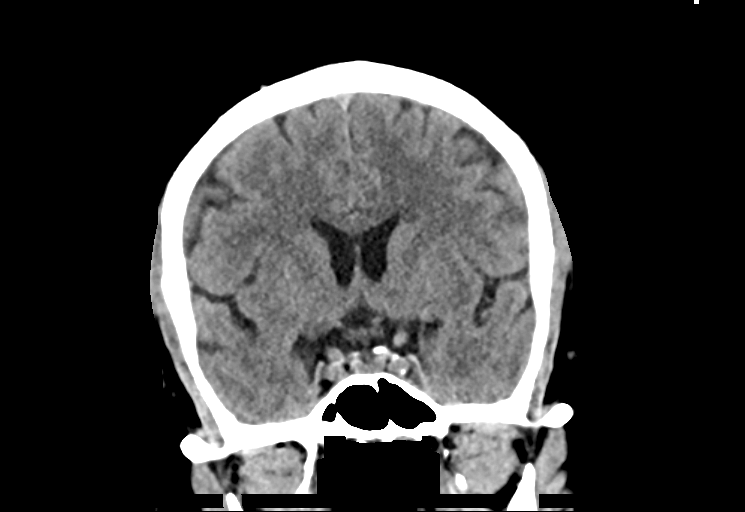
[im 41/73  brain]
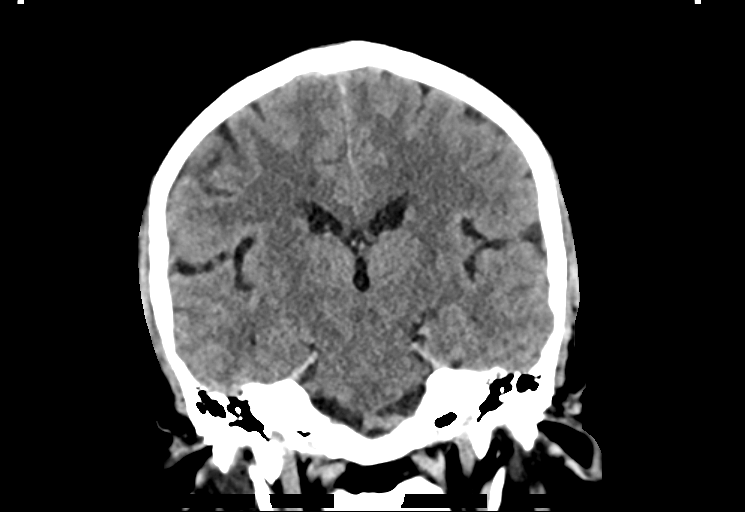

[Series 6: sag soft · sagittal · 0.33mm/px · 3 of 67 slices shown]
[im 23/67  brain]
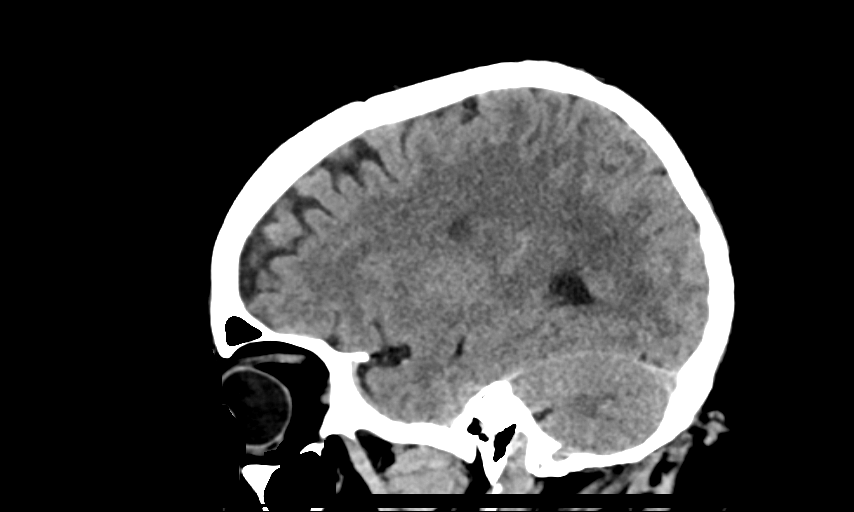
[im 34/67  brain]
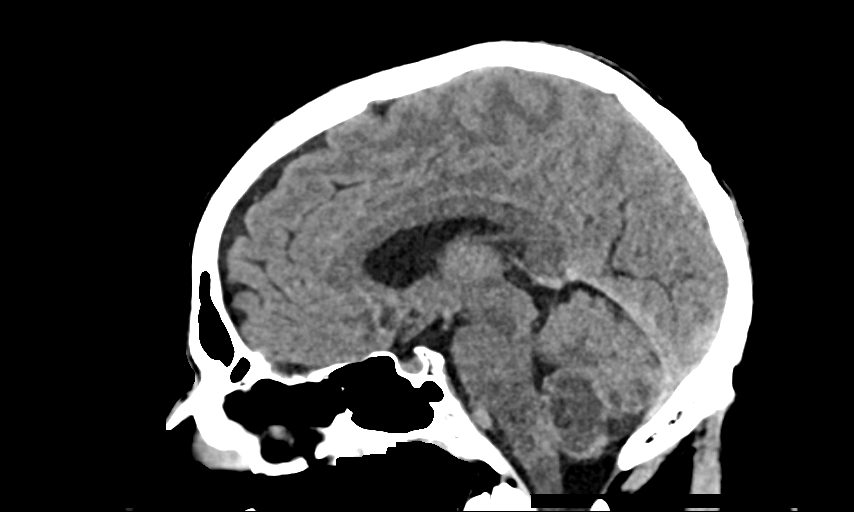
[im 45/67  brain]
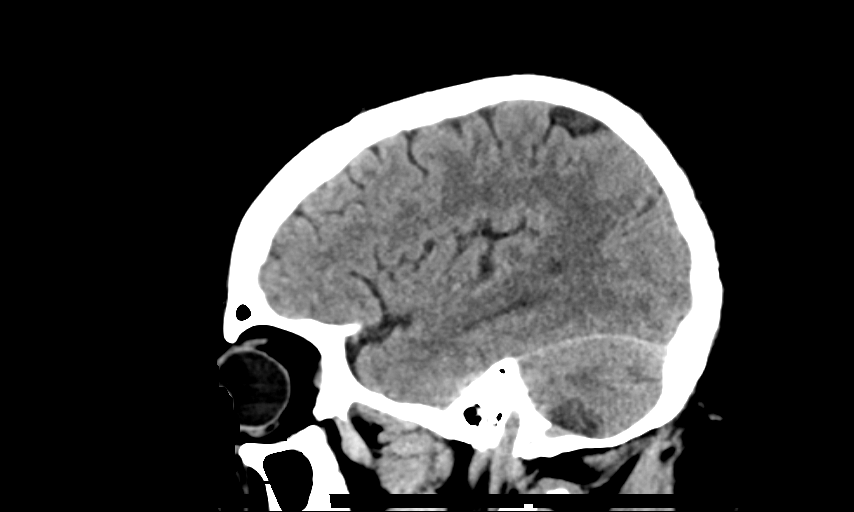

[13 of 47 positions shown; findings below may reference images not displayed]

FINDINGS: Brain: Geographic low attenuation involving the LEFT INFERIOR
cerebellum, a new finding since the prior examinations 11 days ago.
Minimal mass effect. No associated hemorrhage. Ventricular system
normal in size and appearance for age. No extra-axial fluid
collections. No focal brain parenchymal abnormalities elsewhere.

Vascular: No visible atherosclerosis. No hyperdense vessels.

Skull: No skull fracture or other focal osseous abnormality
involving the skull.

Sinuses/Orbits: Visualized paranasal sinuses, bilateral mastoid air
cells and bilateral middle ear cavities well-aerated.

Benign senile calcification involving the RIGHT eye. Visualized
orbits and globes otherwise normal in appearance.

Other: None.
IMPRESSION: 1. Acute/subacute nonhemorrhagic stroke involving the LEFT INFERIOR
cerebellum.
2. Otherwise normal examination.

## 2020-03-16 NOTE — Progress Notes (Signed)
Assessment and Plan:  Paulette was seen today for follow-up.  Diagnoses and all orders for this visit:  Essential hypertension White coat syndrome with hypertension No recurrence of severe htn or epistaxis; per home values best BP control in recorded hx Elevated in office today, but likely white coat syndrome secondary to severe healthcare phobia; continue with current agents; emphasized need for consistent med compliance Monitor blood pressure at home; call if consistently over 140/90 Continue DASH diet.   Reminder to go to the ER if any CP, SOB, nausea, dizziness, severe HA, changes vision/speech, left arm numbness and tingling and jaw pain. Had recent labs in ED; recheck at follow up in Sept.    Further disposition pending results of labs. Discussed med's effects and SE's.   Over 15 minutes of exam, counseling, chart review, and critical decision making was performed.   Future Appointments  Date Time Provider Brice  04/26/2020 10:30 AM Unk Pinto, MD [facility] None    ------------------------------------------------------------------------------------------------------------------  HPI BP (!) 160/92   Pulse 69   Temp (!) 97.3 F (36.3 C)   Wt 185 lb (83.9 kg)   SpO2 98%   BMI 27.32 kg/m   59 y.o.male with hx of severe HTN and CVA, anxiety and health phobia presents for 2 week follow up on BP and episodes of epistaxis, leading to ED visit and syncope on 03/09/2020. Historically with very poor med compliance, with frequent severely elevated BPs 200/100+. Also with severe health care phobia.   He reports since last visit has tapered up on amlodipine to full 10 mg in PM, losartan/HCTZ 100/25 mg in AM. He reports MUCH improved BP values ranging 127/75-155/85, typically low 130s/80s.   Today their BP is BP: (!) 160/92. Checked at home right prior to coming and reports was 132/80.Marland Kitchen He denies chest pain, shortness of breath, dizziness, HA, vision changes, edema, nose  bleeds.    Past Medical History:  Diagnosis Date  . Anxiety   . Hyperlipidemia   . Hypertension   . Migraines   . Prediabetes   . Vitamin D deficiency      Allergies  Allergen Reactions  . Atenolol Other (See Comments)    Made tired    Current Outpatient Medications on File Prior to Visit  Medication Sig  . amLODipine (NORVASC) 10 MG tablet Take 1/2 tablet at Bedtime for BP (Patient taking differently: Take 10 mg by mouth daily. Take 1/2 tablet at Bedtime for BP)  . aspirin EC 81 MG EC tablet Take 1 tablet (81 mg total) by mouth daily.  Marland Kitchen atorvastatin (LIPITOR) 80 MG tablet Take 1 tablet Daily for Cholesterol (Patient taking differently: Take 80 mg by mouth every evening. )  . glipiZIDE (GLUCOTROL) 5 MG tablet Take 1/2 to 1 tablet 2 x day with Meals for  Diabetes (Patient taking differently: Take 5 mg by mouth daily. )  . losartan-hydrochlorothiazide (HYZAAR) 100-25 MG tablet Take 1/2 to 1 tablet Daily for BP (Patient taking differently: Take 1 tablet by mouth every evening. Take 1/2 to 1 tablet Daily for BP)  . metFORMIN (GLUCOPHAGE-XR) 500 MG 24 hr tablet Take 1 to 2 tablets 2 x /day withMeals for Diabetes (Patient taking differently: Take 500 mg by mouth daily. )  . Omega-3 Fatty Acids (FISH OIL PO) Take by mouth daily.  Marland Kitchen omega-3 acid ethyl esters (LOVAZA) 1 g capsule Take 1 g by mouth 2 (two) times daily.  (Patient not taking: Reported on 03/18/2020)   No current facility-administered medications  on file prior to visit.    ROS: all negative except above.   Physical Exam:  BP (!) 160/92   Pulse 69   Temp (!) 97.3 F (36.3 C)   Wt 185 lb (83.9 kg)   SpO2 98%   BMI 27.32 kg/m   General Appearance: Well nourished, in no apparent distress. Eyes: PERRLA, EOMs, conjunctiva no swelling or erythema Sinuses: No Frontal/maxillary tenderness ENT/Mouth: Ext aud canals clear, TMs without erythema, bulging. No erythema, swelling, or exudate on post pharynx.  Tonsils not swollen  or erythematous. Hearing normal.  Neck: Supple, thyroid normal.  Respiratory: Respiratory effort normal, BS equal bilaterally without rales, rhonchi, wheezing or stridor.  Cardio: RRR with no MRGs. Brisk peripheral pulses without edema.  Abdomen: Soft, + BS.  Non tender, no guarding, rebound, hernias, masses. Lymphatics: Non tender without lymphadenopathy.  Musculoskeletal: Full ROM, 5/5 strength, normal gait.  Skin: Warm, dry without rashes, lesions, ecchymosis.  Neuro: Cranial nerves intact. Normal muscle tone, no cerebellar symptoms. Sensation intact.  Psych: Awake and oriented X 3, normal affect, Insight and Judgment appropriate.     Izora Ribas, NP 8:49 AM [facility]

## 2020-03-18 ENCOUNTER — Other Ambulatory Visit: Payer: Self-pay | Admitting: Internal Medicine

## 2020-03-18 ENCOUNTER — Ambulatory Visit (INDEPENDENT_AMBULATORY_CARE_PROVIDER_SITE_OTHER): Payer: PRIVATE HEALTH INSURANCE | Admitting: Adult Health

## 2020-03-18 ENCOUNTER — Encounter: Payer: Self-pay | Admitting: Adult Health

## 2020-03-18 ENCOUNTER — Other Ambulatory Visit: Payer: Self-pay

## 2020-03-18 VITALS — BP 160/92 | HR 69 | Temp 97.3°F | Wt 185.0 lb

## 2020-03-18 DIAGNOSIS — I10 Essential (primary) hypertension: Secondary | ICD-10-CM

## 2020-03-18 DIAGNOSIS — E785 Hyperlipidemia, unspecified: Secondary | ICD-10-CM

## 2020-03-18 DIAGNOSIS — R040 Epistaxis: Secondary | ICD-10-CM

## 2020-03-18 NOTE — Patient Instructions (Addendum)
Goals     Blood Pressure < 130/80        Check fasting sugars - goal is <130  Increase metformin to 2 tabs - 1 with breakfast, 1 with dinner  If any low sugars ( < 90) - stop evening glipizide      Hypoglycemia Hypoglycemia is when the sugar (glucose) level in your blood is too low. Signs of low blood sugar may include:  Feeling: ? Hungry. ? Worried or nervous (anxious). ? Sweaty and clammy. ? Confused. ? Dizzy. ? Sleepy. ? Sick to your stomach (nauseous).  Having: ? A fast heartbeat. ? A headache. ? A change in your vision. ? Tingling or no feeling (numbness) around your mouth, lips, or tongue. ? Jerky movements that you cannot control (seizure).  Having trouble with: ? Moving (coordination). ? Sleeping. ? Passing out (fainting). ? Getting upset easily (irritability). Low blood sugar can happen to people who have diabetes and people who do not have diabetes. Low blood sugar can happen quickly, and it can be an emergency. Treating low blood sugar Low blood sugar is often treated by eating or drinking something sugary right away, such as:  Fruit juice, 4-6 oz (120-150 mL).  Regular soda (not diet soda), 4-6 oz (120-150 mL).  Low-fat milk, 4 oz (120 mL).  Several pieces of hard candy.  Sugar or honey, 1 Tbsp (15 mL). Treating low blood sugar if you have diabetes If you can think clearly and swallow safely, follow the 15:15 rule:  Take 15 grams of a fast-acting carb (carbohydrate). Talk with your doctor about how much you should take.  Always keep a source of fast-acting carb with you, such as: ? Sugar tablets (glucose pills). Take 3-4 pills. ? 6-8 pieces of hard candy. ? 4-6 oz (120-150 mL) of fruit juice. ? 4-6 oz (120-150 mL) of regular (not diet) soda. ? 1 Tbsp (15 mL) honey or sugar.  Check your blood sugar 15 minutes after you take the carb.  If your blood sugar is still at or below 70 mg/dL (3.9 mmol/L), take 15 grams of a carb again.  If your  blood sugar does not go above 70 mg/dL (3.9 mmol/L) after 3 tries, get help right away.  After your blood sugar goes back to normal, eat a meal or a snack within 1 hour.  Treating very low blood sugar If your blood sugar is at or below 54 mg/dL (3 mmol/L), you have very low blood sugar (severe hypoglycemia). This may also cause:  Passing out.  Jerky movements you cannot control (seizure).  Losing consciousness (coma). This is an emergency. Do not wait to see if the symptoms will go away. Get medical help right away. Call your local emergency services (911 in the [place] Do not drive yourself to the hospital. If you have very low blood sugar and you cannot eat or drink, you may need a glucagon shot (injection). A family member or friend should learn how to check your blood sugar and how to give you a glucagon shot. Ask your doctor if you need to have a glucagon shot kit at home. Follow these instructions at home: General instructions  Take over-the-counter and prescription medicines only as told by your doctor.  Stay aware of your blood sugar as told by your doctor.  Limit alcohol intake to no more than 1 drink a day for nonpregnant women and 2 drinks a day for men. One drink equals 12 oz of beer (355 mL), 5  oz of wine (148 mL), or 1 oz of hard liquor (44 mL).  Keep all follow-up visits as told by your doctor. This is important. If you have diabetes:   Follow your diabetes care plan as told by your doctor. Make sure you: ? Know the signs of low blood sugar. ? Take your medicines as told. ? Follow your exercise and meal plan. ? Eat on time. Do not skip meals. ? Check your blood sugar as often as told by your doctor. Always check it before and after exercise. ? Follow your sick day plan when you cannot eat or drink normally. Make this plan ahead of time with your doctor.  Share your diabetes care plan with: ? Your work or school. ? People you live with.  Check your pee (urine) for  ketones: ? When you are sick. ? As told by your doctor.  Carry a card or wear jewelry that says you have diabetes. Contact a doctor if:  You have trouble keeping your blood sugar in your target range.  You have low blood sugar often. Get help right away if:  You still have symptoms after you eat or drink something sugary.  Your blood sugar is at or below 54 mg/dL (3 mmol/L).  You have jerky movements that you cannot control.  You pass out. These symptoms may be an emergency. Do not wait to see if the symptoms will go away. Get medical help right away. Call your local emergency services (911 in the [place] Do not drive yourself to the hospital. Summary  Hypoglycemia happens when the level of sugar (glucose) in your blood is too low.  Low blood sugar can happen to people who have diabetes and people who do not have diabetes. Low blood sugar can happen quickly, and it can be an emergency.  Make sure you know the signs of low blood sugar and know how to treat it.  Always keep a source of sugar (fast-acting carb) with you to treat low blood sugar. This information is not intended to replace advice given to you by your health care provider. Make sure you discuss any questions you have with your health care provider. Document Revised: 11/20/2018 Document Reviewed: 09/02/2015 Elsevier Patient Education  [place].     High-Fiber Diet Fiber, also called dietary fiber, is a type of carbohydrate that is found in fruits, vegetables, whole grains, and beans. A high-fiber diet can have many health benefits. Your health care provider may recommend a high-fiber diet to help:  Prevent constipation. Fiber can make your bowel movements more regular.  Lower your cholesterol.  Relieve the following conditions: ? Swelling of veins in the anus (hemorrhoids). ? Swelling and irritation (inflammation) of specific areas of the digestive tract (uncomplicated diverticulosis). ? A problem of  the large intestine (colon) that sometimes causes pain and diarrhea (irritable bowel syndrome, IBS).  Prevent overeating as part of a weight-loss plan.  Prevent heart disease, type 2 diabetes, and certain cancers. What is my plan? The recommended daily fiber intake in grams (g) includes:  38 g for men age 57 or younger.  30 g for men over age 67.  24 g for women age 53 or younger.  21 g for women over age 24. You can get the recommended daily intake of dietary fiber by:  Eating a variety of fruits, vegetables, grains, and beans.  Taking a fiber supplement, if it is not possible to get enough fiber through your diet. What do I  need to know about a high-fiber diet?  It is better to get fiber through food sources rather than from fiber supplements. There is not a lot of research about how effective supplements are.  Always check the fiber content on the nutrition facts label of any prepackaged food. Look for foods that contain 5 g of fiber or more per serving.  Talk with a diet and nutrition specialist (dietitian) if you have questions about specific foods that are recommended or not recommended for your medical condition, especially if those foods are not listed below.  Gradually increase how much fiber you consume. If you increase your intake of dietary fiber too quickly, you may have bloating, cramping, or gas.  Drink plenty of water. Water helps you to digest fiber. What are tips for following this plan?  Eat a wide variety of high-fiber foods.  Make sure that half of the grains that you eat each day are whole grains.  Eat breads and cereals that are made with whole-grain flour instead of refined flour or white flour.  Eat brown rice, bulgur wheat, or millet instead of white rice.  Start the day with a breakfast that is high in fiber, such as a cereal that contains 5 g of fiber or more per serving.  Use beans in place of meat in soups, salads, and pasta dishes.  Eat  high-fiber snacks, such as berries, raw vegetables, nuts, and popcorn.  Choose whole fruits and vegetables instead of processed forms like juice or sauce. What foods can I eat?  Fruits Berries. Pears. Apples. Oranges. Avocado. Prunes and raisins. Dried figs. Vegetables Sweet potatoes. Spinach. Kale. Artichokes. Cabbage. Broccoli. Cauliflower. Green peas. Carrots. Squash. Grains Whole-grain breads. Multigrain cereal. Oats and oatmeal. Brown rice. Barley. Bulgur wheat. [place]. Quinoa. Bran muffins. Popcorn. Rye wafer crackers. Meats and other proteins Navy, kidney, and pinto beans. Soybeans. Split peas. Lentils. Nuts and seeds. Dairy Fiber-fortified yogurt. Beverages Fiber-fortified soy milk. Fiber-fortified orange juice. Other foods Fiber bars. The items listed above may not be a complete list of recommended foods and beverages. Contact a dietitian for more options. What foods are not recommended? Fruits Fruit juice. Cooked, strained fruit. Vegetables Fried potatoes. Canned vegetables. Well-cooked vegetables. Grains White bread. Pasta made with refined flour. White rice. Meats and other proteins Fatty cuts of meat. Fried chicken or fried fish. Dairy Milk. Yogurt. Cream cheese. Sour cream. Fats and oils Butters. Beverages Soft drinks. Other foods Cakes and pastries. The items listed above may not be a complete list of foods and beverages to avoid. Contact a dietitian for more information. Summary  Fiber is a type of carbohydrate. It is found in fruits, vegetables, whole grains, and beans.  There are many health benefits of eating a high-fiber diet, such as preventing constipation, lowering blood cholesterol, helping with weight loss, and reducing your risk of heart disease, diabetes, and certain cancers.  Gradually increase your intake of fiber. Increasing too fast can result in cramping, bloating, and gas. Drink plenty of water while you increase your fiber.  The best  sources of fiber include whole fruits and vegetables, whole grains, nuts, seeds, and beans. This information is not intended to replace advice given to you by your health care provider. Make sure you discuss any questions you have with your health care provider. Document Revised: 06/03/2017 Document Reviewed: 06/03/2017 Elsevier Patient Education  2020 [employer].

## 2020-04-20 ENCOUNTER — Ambulatory Visit (HOSPITAL_COMMUNITY)
Admission: RE | Admit: 2020-04-20 | Discharge: 2020-04-20 | Disposition: A | Discharge status: Home / Self Care | Payer: PRIVATE HEALTH INSURANCE | Source: Ambulatory Visit | Attending: Pulmonary Disease | Admitting: Pulmonary Disease

## 2020-04-20 ENCOUNTER — Encounter: Payer: Self-pay | Admitting: Physician Assistant

## 2020-04-20 ENCOUNTER — Other Ambulatory Visit: Payer: Self-pay | Admitting: Physician Assistant

## 2020-04-20 DIAGNOSIS — I10 Essential (primary) hypertension: Secondary | ICD-10-CM | POA: Insufficient documentation

## 2020-04-20 DIAGNOSIS — E119 Type 2 diabetes mellitus without complications: Secondary | ICD-10-CM | POA: Insufficient documentation

## 2020-04-20 DIAGNOSIS — E663 Overweight: Secondary | ICD-10-CM | POA: Insufficient documentation

## 2020-04-20 DIAGNOSIS — U071 COVID-19: Secondary | ICD-10-CM

## 2020-04-20 DIAGNOSIS — Z8673 Personal history of transient ischemic attack (TIA), and cerebral infarction without residual deficits: Secondary | ICD-10-CM | POA: Insufficient documentation

## 2020-04-20 DIAGNOSIS — I639 Cerebral infarction, unspecified: Secondary | ICD-10-CM | POA: Insufficient documentation

## 2020-04-20 MED ORDER — DIPHENHYDRAMINE HCL 50 MG/ML IJ SOLN: 50.0000 mg | Freq: Once | INTRAMUSCULAR | Status: DC | PRN

## 2020-04-20 MED ORDER — METHYLPREDNISOLONE SODIUM SUCC 125 MG IJ SOLR: 125.0000 mg | Freq: Once | INTRAMUSCULAR | Status: DC | PRN

## 2020-04-20 MED ORDER — SODIUM CHLORIDE 0.9 % IV SOLN
1200.0000 mg | Freq: Once | INTRAVENOUS | Status: AC
  Administered 2020-04-20: 1200 mg via INTRAVENOUS
  Filled 2020-04-20: qty 10

## 2020-04-20 MED ORDER — EPINEPHRINE 0.3 MG/0.3ML IJ SOAJ: 0.3000 mg | Freq: Once | INTRAMUSCULAR | Status: DC | PRN

## 2020-04-20 MED ORDER — SODIUM CHLORIDE 0.9 % IV SOLN: INTRAVENOUS | Status: DC | PRN

## 2020-04-20 MED ORDER — FAMOTIDINE IN NACL 20-0.9 MG/50ML-% IV SOLN: 20.0000 mg | Freq: Once | INTRAVENOUS | Status: DC | PRN

## 2020-04-20 MED ORDER — ALBUTEROL SULFATE HFA 108 (90 BASE) MCG/ACT IN AERS: 2.0000 | INHALATION_SPRAY | Freq: Once | RESPIRATORY_TRACT | Status: DC | PRN

## 2020-04-20 MED ORDER — ACETAMINOPHEN 325 MG PO TABS
650.0000 mg | ORAL_TABLET | Freq: Once | ORAL | Status: AC
  Administered 2020-04-20: 650 mg via ORAL
  Filled 2020-04-20: qty 2

## 2020-04-20 NOTE — Progress Notes (Signed)
I connected by phone with Gabriel Hoover on 04/20/2020 at 12:17 PM to discuss the potential use of a new treatment for mild to moderate COVID-19 viral infection in non-hospitalized patients.  This patient is a 59 y.o. male that meets the FDA criteria for Emergency Use Authorization of COVID monoclonal antibody casirivimab/imdevimab.  Has a (+) direct SARS-CoV-2 viral test result  Has mild or moderate COVID-19   Is NOT hospitalized due to COVID-19  Is within 10 days of symptom onset  Has at least one of the high risk factor(s) for progression to severe COVID-19 and/or hospitalization as defined in EUA.  Specific high risk criteria : BMI > 25, Diabetes and Cardiovascular disease or hypertension   I have spoken and communicated the following to the patient or parent/caregiver regarding COVID monoclonal antibody treatment:  1. FDA has authorized the emergency use for the treatment of mild to moderate COVID-19 in adults and pediatric patients with positive results of direct SARS-CoV-2 viral testing who are 4 years of age and older weighing at least 40 kg, and who are at high risk for progressing to severe COVID-19 and/or hospitalization.  2. The significant known and potential risks and benefits of COVID monoclonal antibody, and the extent to which such potential risks and benefits are unknown.  3. Information on available alternative treatments and the risks and benefits of those alternatives, including clinical trials.  4. Patients treated with COVID monoclonal antibody should continue to self-isolate and use infection control measures (e.g., wear mask, isolate, social distance, avoid sharing personal items, clean and disinfect "high touch" surfaces, and frequent handwashing) according to CDC guidelines.   5. The patient or parent/caregiver has the option to accept or refuse COVID monoclonal antibody treatment.  After reviewing this information with the patient, The patient agreed to proceed  with receiving casirivimab\imdevimab infusion and will be provided a copy of the Fact sheet prior to receiving the infusion.  Sx onset 9/3. Set up for infusion on 9/8 @ 2pm. Directions given to [facility]. Pt is aware that insurance will be charged an infusion fee. Pt is unvaccinated. He has not been tested due to being unable to find a test. Wife is positive and having classic covid symptoms.  Angelena Form 04/20/2020 12:17 PM

## 2020-04-20 NOTE — Discharge Instructions (Signed)

## 2020-04-20 NOTE — Progress Notes (Signed)
  Diagnosis: COVID-19  Physician:Dr. Patrick Wright  Procedure: [facility] Med: casirivimab\imdevimab infusion - Provided patient with casirivimab\imdevimab fact sheet for patients, parents and caregivers prior to infusion.  Complications: No immediate complications noted.  Discharge: Discharged home   Gabriel Hoover 04/20/2020   

## 2020-04-25 ENCOUNTER — Encounter: Payer: Self-pay | Admitting: Internal Medicine

## 2020-04-25 NOTE — Progress Notes (Signed)
       N  O     S  H  O  W                                                                                                                                                                                             This very nice 59 y.o.  MWM presents for 9  month follow up with HTN, HLD, Pre-Diabetes and Vitamin D Deficiency.       Patient is treated for Severe Labile HTN  (6295) complicated by poor insight /comprehension and very poor compliance.  Patient was hospitalized  July 2020 w/a left cerebellar Stroke  &  2D echocardiogram, Carotid Aa U/S, Bubble test and TEE were negative. He was discharged on ASA & Plavix.  BP has been unknown controlled at home. Today's  . Patient has had no complaints of any cardiac type chest pain, palpitations, dyspnea / orthopnea / PND, dizziness, claudication, or dependent edema.      Hyperlipidemia is controlled with diet & meds. Patient denies myalgias or other med SE's. Last Lipids were   Lab Results  Component Value Date   CHOL 127 10/13/2019   HDL 48 10/13/2019   LDLCALC 65 10/13/2019   TRIG 68 10/13/2019   CHOLHDL 2.6 10/13/2019    Also, the patient has history of T2_NIDDM (2015) wth CKD3B (GFR 56)  and has had no symptoms of reactive hypoglycemia, diabetic polys, paresthesias or visual blurring.  Last A1c was near goal:  Lab Results  Component Value Date   HGBA1C 5.9 (H) 10/13/2019       Further, the patient also has history of Vitamin D Deficiency ("23" /2013) and does not supplements vitamin D as recommended. Last vitamin D was still very low:

## 2020-04-26 ENCOUNTER — Ambulatory Visit: Payer: PRIVATE HEALTH INSURANCE | Admitting: Internal Medicine

## 2020-04-26 DIAGNOSIS — Z9119 Patient's noncompliance with other medical treatment and regimen: Secondary | ICD-10-CM

## 2020-04-26 DIAGNOSIS — E1169 Type 2 diabetes mellitus with other specified complication: Secondary | ICD-10-CM

## 2020-04-26 DIAGNOSIS — I10 Essential (primary) hypertension: Secondary | ICD-10-CM

## 2020-04-26 DIAGNOSIS — E559 Vitamin D deficiency, unspecified: Secondary | ICD-10-CM

## 2020-04-26 DIAGNOSIS — Z8673 Personal history of transient ischemic attack (TIA), and cerebral infarction without residual deficits: Secondary | ICD-10-CM

## 2020-04-26 DIAGNOSIS — E1121 Type 2 diabetes mellitus with diabetic nephropathy: Secondary | ICD-10-CM

## 2020-04-26 DIAGNOSIS — Z5329 Procedure and treatment not carried out because of patient's decision for other reasons: Secondary | ICD-10-CM

## 2020-04-26 DIAGNOSIS — N1831 Chronic kidney disease, stage 3a: Secondary | ICD-10-CM

## 2020-04-26 DIAGNOSIS — E785 Hyperlipidemia, unspecified: Secondary | ICD-10-CM

## 2020-05-20 ENCOUNTER — Other Ambulatory Visit: Payer: Self-pay | Admitting: Internal Medicine

## 2020-05-20 DIAGNOSIS — E118 Type 2 diabetes mellitus with unspecified complications: Secondary | ICD-10-CM

## 2020-05-20 DIAGNOSIS — I10 Essential (primary) hypertension: Secondary | ICD-10-CM

## 2020-06-10 ENCOUNTER — Other Ambulatory Visit: Payer: Self-pay | Admitting: Adult Health

## 2020-06-10 DIAGNOSIS — I10 Essential (primary) hypertension: Secondary | ICD-10-CM

## 2020-06-17 ENCOUNTER — Other Ambulatory Visit: Payer: Self-pay | Admitting: Internal Medicine

## 2020-06-17 DIAGNOSIS — E785 Hyperlipidemia, unspecified: Secondary | ICD-10-CM

## 2020-06-17 DIAGNOSIS — E1169 Type 2 diabetes mellitus with other specified complication: Secondary | ICD-10-CM

## 2020-06-18 ENCOUNTER — Other Ambulatory Visit: Payer: Self-pay | Admitting: Internal Medicine

## 2020-06-18 DIAGNOSIS — E785 Hyperlipidemia, unspecified: Secondary | ICD-10-CM

## 2020-06-18 DIAGNOSIS — E1169 Type 2 diabetes mellitus with other specified complication: Secondary | ICD-10-CM

## 2020-06-25 ENCOUNTER — Other Ambulatory Visit: Payer: Self-pay | Admitting: Adult Health

## 2020-06-25 DIAGNOSIS — I10 Essential (primary) hypertension: Secondary | ICD-10-CM

## 2020-06-28 ENCOUNTER — Other Ambulatory Visit: Payer: Self-pay | Admitting: Internal Medicine

## 2020-06-28 DIAGNOSIS — I10 Essential (primary) hypertension: Secondary | ICD-10-CM

## 2020-06-28 MED ORDER — LOSARTAN POTASSIUM-HCTZ 100-25 MG PO TABS: ORAL_TABLET | ORAL | 1 refills | Status: DC

## 2020-06-28 MED ORDER — AMLODIPINE BESYLATE 10 MG PO TABS: ORAL_TABLET | ORAL | 1 refills | Status: DC

## 2020-07-10 ENCOUNTER — Other Ambulatory Visit: Payer: Self-pay | Admitting: Internal Medicine

## 2020-07-10 DIAGNOSIS — E118 Type 2 diabetes mellitus with unspecified complications: Secondary | ICD-10-CM

## 2020-07-10 MED ORDER — GLIPIZIDE 5 MG PO TABS: ORAL_TABLET | ORAL | 0 refills | Status: DC

## 2020-07-14 ENCOUNTER — Ambulatory Visit: Payer: PRIVATE HEALTH INSURANCE | Admitting: Adult Health

## 2020-07-14 ENCOUNTER — Encounter: Payer: Self-pay | Admitting: Adult Health

## 2020-07-14 ENCOUNTER — Other Ambulatory Visit: Payer: Self-pay

## 2020-07-14 VITALS — BP 164/100 | HR 75 | Temp 97.5°F | Ht 68.5 in | Wt 179.0 lb

## 2020-07-14 DIAGNOSIS — Z131 Encounter for screening for diabetes mellitus: Secondary | ICD-10-CM | POA: Diagnosis not present

## 2020-07-14 DIAGNOSIS — Z13220 Encounter for screening for lipoid disorders: Secondary | ICD-10-CM | POA: Diagnosis not present

## 2020-07-14 DIAGNOSIS — Z0000 Encounter for general adult medical examination without abnormal findings: Secondary | ICD-10-CM

## 2020-07-14 DIAGNOSIS — E119 Type 2 diabetes mellitus without complications: Secondary | ICD-10-CM

## 2020-07-14 DIAGNOSIS — E559 Vitamin D deficiency, unspecified: Secondary | ICD-10-CM

## 2020-07-14 DIAGNOSIS — E1122 Type 2 diabetes mellitus with diabetic chronic kidney disease: Secondary | ICD-10-CM

## 2020-07-14 DIAGNOSIS — E663 Overweight: Secondary | ICD-10-CM

## 2020-07-14 DIAGNOSIS — N401 Enlarged prostate with lower urinary tract symptoms: Secondary | ICD-10-CM

## 2020-07-14 DIAGNOSIS — Z136 Encounter for screening for cardiovascular disorders: Secondary | ICD-10-CM

## 2020-07-14 DIAGNOSIS — R350 Frequency of micturition: Secondary | ICD-10-CM

## 2020-07-14 DIAGNOSIS — E785 Hyperlipidemia, unspecified: Secondary | ICD-10-CM

## 2020-07-14 DIAGNOSIS — Z8249 Family history of ischemic heart disease and other diseases of the circulatory system: Secondary | ICD-10-CM

## 2020-07-14 DIAGNOSIS — Z79899 Other long term (current) drug therapy: Secondary | ICD-10-CM | POA: Diagnosis not present

## 2020-07-14 DIAGNOSIS — Z1389 Encounter for screening for other disorder: Secondary | ICD-10-CM

## 2020-07-14 DIAGNOSIS — Z1211 Encounter for screening for malignant neoplasm of colon: Secondary | ICD-10-CM

## 2020-07-14 DIAGNOSIS — Z125 Encounter for screening for malignant neoplasm of prostate: Secondary | ICD-10-CM | POA: Diagnosis not present

## 2020-07-14 DIAGNOSIS — F40231 Fear of injections and transfusions: Secondary | ICD-10-CM

## 2020-07-14 DIAGNOSIS — L989 Disorder of the skin and subcutaneous tissue, unspecified: Secondary | ICD-10-CM

## 2020-07-14 DIAGNOSIS — I10 Essential (primary) hypertension: Secondary | ICD-10-CM | POA: Diagnosis not present

## 2020-07-14 DIAGNOSIS — E1169 Type 2 diabetes mellitus with other specified complication: Secondary | ICD-10-CM

## 2020-07-14 DIAGNOSIS — Z8673 Personal history of transient ischemic attack (TIA), and cerebral infarction without residual deficits: Secondary | ICD-10-CM

## 2020-07-14 DIAGNOSIS — N182 Chronic kidney disease, stage 2 (mild): Secondary | ICD-10-CM

## 2020-07-14 DIAGNOSIS — Z91148 Patient's other noncompliance with medication regimen for other reason: Secondary | ICD-10-CM

## 2020-07-14 DIAGNOSIS — B351 Tinea unguium: Secondary | ICD-10-CM

## 2020-07-14 DIAGNOSIS — Z1329 Encounter for screening for other suspected endocrine disorder: Secondary | ICD-10-CM

## 2020-07-14 DIAGNOSIS — Z9114 Patient's other noncompliance with medication regimen: Secondary | ICD-10-CM

## 2020-07-14 MED ORDER — LOSARTAN POTASSIUM-HCTZ 100-25 MG PO TABS: ORAL_TABLET | ORAL | 1 refills | Status: DC

## 2020-07-14 MED ORDER — AMLODIPINE BESYLATE 10 MG PO TABS: ORAL_TABLET | ORAL | 1 refills | Status: DC

## 2020-07-14 NOTE — Progress Notes (Signed)
Complete Physical  Assessment and Plan:  Gabriel Hoover was seen today for annual exam.  Diagnoses and all orders for this visit:  Encounter for routine history and physical examination  White coat syndrome with hypertension Well controlled per home log; continue current treatment  Monitor blood pressure at home; call if consistently over 130/80 Continue DASH diet.   Reminder to go to the ER if any CP, SOB, nausea, dizziness, severe HA, changes vision/speech, left arm numbness and tingling and jaw pain. -     losartan-hydrochlorothiazide (HYZAAR) 100-25 MG tablet; Take 1 tablet Daily for BP. -     amLODipine (NORVASC) 10 MG tablet; Take 1 tablet at Bedtime for BP -     CBC with Differential/Platelet -     COMPLETE METABOLIC PANEL WITH GFR -     Magnesium -     Urinalysis, Routine w reflex microscopic -     Microalbumin / creatinine urine ratio -     EKG 12-Lead  History of CVA (cerebrovascular accident) Control blood pressure, cholesterol, increase exercise, ASA   T2_NIDDM (HCC) Type 2 Diabetes Mellitus - poorly controlled Education: Reviewed ABCs of diabetes management (respective goals in parentheses):  A1C (<7), blood pressure (<130/80), and cholesterol (LDL <70) Eye Exam yearly and Dental Exam every 6 months.- exam report requested Foot exam completed Dietary recommendations Physical Activity recommendations Not checking sugars due to severe needle phobia - maintaining well without concerning sx of hypoglycemia; reduce glipizide if needed - given foot care handout and explained the principles  - given instructions for hypoglycemia management  -     COMPLETE METABOLIC PANEL WITH GFR -     Hemoglobin A1c -     Urinalysis, Routine w reflex microscopic -     Microalbumin / creatinine urine ratio  Hyperlipidemia associated with type 2 diabetes mellitus (HCC) Continue medications, LDL goal <70 Continue low cholesterol diet and exercise.  Check lipid panel.  -     Lipid panel -      TSH  CKD stage 2 due to type 2 diabetes mellitus (HCC) Increase fluids, avoid NSAIDS, monitor sugars, will monitor -     COMPLETE METABOLIC PANEL WITH GFR -     Urinalysis, Routine w reflex microscopic -     Microalbumin / creatinine urine ratio  Vitamin D deficiency Start supplement; defer check due to insurance; try drops or wafers  Medication management -     CBC with Differential/Platelet -     COMPLETE METABOLIC PANEL WITH GFR -     Magnesium  Overweight (BMI 25.0-29.9) Long discussion about weight loss, diet, and exercise Recommended diet heavy in fruits and veggies and low in animal meats, cheeses, and dairy products, appropriate calorie intake Discussed appropriate weight for height Follow up at next visit  Severe needle phobia Benzo if needed;   Poor compliance with medication Improved; continue to monitor; emphasized risks of non-compliance; patient appears motivated, denies barriers; keep pill burden low if possible   Screening for cardiovascular condition -     EKG 12-Lead  Screening for thyroid disorder -     TSH  Screening for prostate cancer -     PSA  Screening for colon cancer - patient declines a colonoscopy even though the risks and benefits were discussed at length. Colon cancer is 3rd most diagnosed cancer and 2nd leading cause of death in both men and women 25 years of age and older. Patient understands the risk of cancer and death with declining the test  however they are willing to do cologuard screening instead. They understand that this is not as sensitive or specific as a colonoscopy and they are still recommended to get a colonoscopy. The cologuard will be sent out to their house.  -     Cologuard  Skin lesion of foot Pyogenic granuloma vs possible SCC?  Severe needle phobia, will see if skin surgery center can biopsy/remove with gas/antianxiety protocol -  -     Ambulatory referral to Dermatology  Onychomycosis Declines  treatment   Discussed med's effects and SE's. Screening labs and tests as requested with regular follow-up as recommended. Over 40 minutes of exam, counseling, chart review and critical decision making was performed  Future Appointments  Date Time Provider [place]  11/17/2020 10:30 AM Liane Comber, NP [facility] None  02/22/2021  9:30 AM Liane Comber, NP [facility] None  07/17/2021  3:00 PM Liane Comber, NP [facility] None     HPI 59 y.o. patient presents for a complete physical. He has Severe needle phobia; Hyperlipidemia associated with type 2 diabetes mellitus ([place]); White coat syndrome with hypertension; T2_NIDDM ([place]); Vitamin D deficiency; Medication management; Overweight (BMI 25.0-29.9); Poor compliance with medication; Amaurosis fugax; History of CVA (cerebrovascular accident); Hypertension; and CKD stage 2 due to type 2 diabetes mellitus ([place]) on their problem list.   Married, working as Company secretary and side jobs.  2 kids, 1 grandson - 3 years.   Patient has hx/o Severe labile HTN predating since 7209 complicated by poor insight /comprehension and very poor compliance attributed to his anxiety re: having venipuncture.  In July 2020, patient was hospitalized with a left cerebellar CVA. Cardiac 2D echo, Carotid Aa U/S, Bubble test and TEE were negative. He was started on ASA & Plavix.    He has been seeing ortho for cervical pain with radiculopathy, has been prescribed gabapentin, vicodin and ? Meloxicam, hasn't taken, discussed injections but needle phobia and rather be in pain.   BMI is Body mass index is 26.82 kg/m., he has been working on diet and exercise. He drinks 8+ glasses of water daily, rare soda, does admit to 5 cups of coffee daily  Wt Readings from Last 3 Encounters:  07/14/20 179 lb (81.2 kg)  03/18/20 185 lb (83.9 kg)  03/07/20 188 lb (85.3 kg)   His blood pressure has been controlled at home (120/80s), today their BP is BP: (!) 164/100 Long hx of  white coat hypertension He does workout. He denies chest pain, shortness of breath, dizziness.   He is on cholesterol medication and denies myalgias. His cholesterol is at goal. The cholesterol last visit was:   Lab Results  Component Value Date   CHOL 127 10/13/2019   HDL 48 10/13/2019   LDLCALC 65 10/13/2019   TRIG 68 10/13/2019   CHOLHDL 2.6 10/13/2019   He has been working on diet and exercise for T2DM with CKD (A1C 10.1% in 05/2019), much improved by lifestyle and metformin (taking 1 tab daily only, has GI SE with higher dose, also taking glipizide 2.5 mg BID), he is on bASA, he is on ACE/ARB and denies increased appetite, nausea, paresthesia of the feet, polydipsia, polyuria and visual disturbances. Last A1C in the office was:  Lab Results  Component Value Date   HGBA1C 5.9 (H) 10/13/2019   Last GFR: Lab Results  Component Value Date   GFRNONAA 56 (L) 03/09/2020   GFRNONAA 82 10/13/2019   GFRNONAA >60 03/02/2019   Patient is not on Vitamin D supplement,  Lab Results  Component Value Date   VD25OH 30 10/13/2019     Last PSA was: Lab Results  Component Value Date   PSA 0.86 05/19/2015     Current Medications:  Current Outpatient Medications on File Prior to Visit  Medication Sig Dispense Refill   aspirin EC 81 MG EC tablet Take 1 tablet (81 mg total) by mouth daily. 90 tablet 0   atorvastatin (LIPITOR) 80 MG tablet TAKE ONE TABLET BY MOUTH DAILY FOR CHOLESTEROL 90 tablet 0   GABAPENTIN PO Take by mouth 3 (three) times daily.     glipiZIDE (GLUCOTROL) 5 MG tablet Take      1/2 to 1 tablet      2 x /day      with Meals      for Diabetes 60 tablet 0   metFORMIN (GLUCOPHAGE-XR) 500 MG 24 hr tablet Take 1 to 2 tablets 2 x /day withMeals for Diabetes (Patient taking differently: Take 500 mg by mouth daily. ) 360 tablet 0   Omega-3 Fatty Acids (FISH OIL PO) Take by mouth daily.     No current facility-administered medications on file prior to visit.   Allergies:   Allergies  Allergen Reactions   Atenolol Other (See Comments)    Made tired   Health Maintenance:   There is no immunization history on file for this patient.  Declines all immunizations   Colonoscopy: - declines, willing to consider cologuard  Eye Exam: [facility], Dr.  Dentist: - 2020, sees his sister who is hygienist when in town  Patient Care Team: Unk Pinto, MD as PCP - General (Internal Medicine)  Medical History:  has Severe needle phobia; Hyperlipidemia associated with type 2 diabetes mellitus ([place]); White coat syndrome with hypertension; T2_NIDDM ([place]); Vitamin D deficiency; Medication management; Overweight (BMI 25.0-29.9); Poor compliance with medication; Amaurosis fugax; History of CVA (cerebrovascular accident); Hypertension; and CKD stage 2 due to type 2 diabetes mellitus ([place]) on their problem list. Surgical History:  He  has a past surgical history that includes TEE without cardioversion (N/A, 03/02/2019); Bubble study (03/02/2019); and Refractive surgery (Bilateral, 1991). Family History:  His family history includes Dementia (age of onset: 33) in his father; Heart attack in his maternal grandfather; Heart attack (age of onset: 62) in his paternal grandfather; Heart disease in his father; Hyperlipidemia in his father. Social History:   reports that he has never smoked. He has never used smokeless tobacco. He reports that he does not drink alcohol and does not use drugs.   Review of Systems:  Review of Systems  Constitutional: Negative for malaise/fatigue and weight loss.  HENT: Negative for hearing loss and tinnitus.   Eyes: Negative for blurred vision and double vision.  Respiratory: Negative for cough, shortness of breath and wheezing.   Cardiovascular: Negative for chest pain, palpitations, orthopnea, claudication and leg swelling.  Gastrointestinal: Negative for abdominal pain, blood in stool, constipation, diarrhea, heartburn, melena, nausea and  vomiting.  Genitourinary: Negative.   Musculoskeletal: Negative for joint pain and myalgias.  Skin: Negative for rash.  Neurological: Negative for dizziness, tingling, sensory change, weakness and headaches.  Endo/Heme/Allergies: Negative for polydipsia.  Psychiatric/Behavioral: Negative.   All other systems reviewed and are negative.   Physical Exam: Estimated body mass index is 26.82 kg/m as calculated from the following:   Height as of this encounter: 5' 8.5" (1.74 m).   Weight as of this encounter: 179 lb (81.2 kg). BP (!) 164/100    Pulse 75  Temp (!) 97.5 F (36.4 C)    Ht 5' 8.5" (1.74 m)    Wt 179 lb (81.2 kg)    SpO2 97%    BMI 26.82 kg/m  General Appearance: Well nourished, in no apparent distress.  Eyes: PERRLA, EOMs, conjunctiva no swelling or erythema Sinuses: No Frontal/maxillary tenderness  ENT/Mouth: Ext aud canals clear, normal light reflex with TMs without erythema, bulging. Good dentition. No erythema, swelling, or exudate on post pharynx. Tonsils not swollen or erythematous. Hearing normal.  Neck: Supple, thyroid normal. No bruits  Respiratory: Respiratory effort normal, BS equal bilaterally without rales, rhonchi, wheezing or stridor.  Cardio: RRR without murmurs, rubs or gallops. Brisk peripheral pulses without edema.  Chest: symmetric, with normal excursions and percussion.  Abdomen: Soft, nontender, no guarding, rebound, hernias, masses, or organomegaly.  Lymphatics: Non tender without lymphadenopathy.  Genitourinary: no concerns, defer Musculoskeletal: Full ROM all peripheral extremities,5/5 strength, and normal gait.  Skin: Warm, dry without rashes, ecchymosis. Bil feet onycomycosis, R 3rd digit soft tissue growth, pendulous, see attached photo Neuro: Cranial nerves intact, reflexes equal bilaterally. Normal muscle tone, no cerebellar symptoms. Sensation intact bil upper and lower extremities.  Psych: Awake and oriented X 3, normal affect, Insight and  Judgment appropriate.       EKG: Sinus rhythm, NSCPT   Gorden Harms Sherriann Szuch 3:53 PM [facility]

## 2020-07-14 NOTE — Patient Instructions (Addendum)
  Mr. Beasley , Thank you for taking time to come for your Annual Wellness Visit. I appreciate your ongoing commitment to your health goals. Please review the following plan we discussed and let me know if I can assist you in the future.   These are the goals we discussed: Goals    . Blood Pressure < 130/80       This is a list of the screening recommended for you and due dates:  Health Maintenance  Topic Date Due  . Pneumococcal vaccine  Never done  . Complete foot exam   Never done  . Eye exam for diabetics  Never done  . COVID-19 Vaccine (1) Never done  . Tetanus Vaccine  Never done  . Colon Cancer Screening  Never done  . Flu Shot  Never done  . Hemoglobin A1C  04/14/2020  .  Hepatitis C: One time screening is recommended by Center for Disease Control  (CDC) for  adults born from [birth year] through 1965.   Completed  . HIV Screening  Completed      Check if med at home is meloxicam - this helps with inflammation - take 1 tab daily with food   Try gabapentin 2-3 tabs at night 1 hour prior to bedtime   Please start checking blood pressures so you can tell me if things look ok at home      Comern[employer]o is an easy to use noninvasive colon cancer screening test based on the latest advances in stool DNA science.   Colon cancer is 3rd most diagnosed cancer and 2nd leading cause of death in both men and women 59 years of age and older despite being one of the most preventable and treatable cancers if found early.  4 of out 5 people diagnosed with colon cancer have NO prior family history.  When caught EARLY 90% of colon cancer is curable.   More than 92% of cologuard patients have NO out of pocket cost for screening however only your insurer can confirm how Cologuard would be covered for you. [facility] has a team of specialist that can help you contact your insurer and ask the right questions. Please call [phone] so they can help.   You will receive a  short call from [place] support center at [employer], when you receive a call they will say they are from [place],  to confirm your mailing address and give you more information.  When they calll you, it will appear on the caller ID as "[employer]" or in some cases only this number will appear, [phone].   [employer] will ship your collection kit directly to you. You will collect a single stool sample in the privacy of your own home, no special preparation required. You will return the kit via [place] pre-paid shipping or pick-up, in the same box it arrived in. Then I will contact you to discuss your results after I receive them from the laboratory.   If you have any questions or concerns, [facility] Customer Support Specialist are available 24 hours a day, 7 days a week at [phone] or go to [email or web address].    A great goal to work towards is aiming to get in a serving daily of some of the most nutritionally dense foods - G- BOMBS daily

## 2020-07-15 LAB — CBC WITH DIFFERENTIAL/PLATELET
Absolute Monocytes: 733 cells/uL (ref 200–950)
Basophils Absolute: 139 cells/uL (ref 0–200)
Basophils Relative: 1.4 %
Eosinophils Absolute: 713 cells/uL — ABNORMAL HIGH (ref 15–500)
Eosinophils Relative: 7.2 %
HCT: 40.4 % (ref 38.5–50.0)
Hemoglobin: 13.8 g/dL (ref 13.2–17.1)
Lymphs Abs: 2554 cells/uL (ref 850–3900)
MCH: 29.6 pg (ref 27.0–33.0)
MCHC: 34.2 g/dL (ref 32.0–36.0)
MCV: 86.7 fL (ref 80.0–100.0)
MPV: 10.8 fL (ref 7.5–12.5)
Monocytes Relative: 7.4 %
Neutro Abs: 5762 cells/uL (ref 1500–7800)
Neutrophils Relative %: 58.2 %
Platelets: 317 10*3/uL (ref 140–400)
RBC: 4.66 10*6/uL (ref 4.20–5.80)
RDW: 12.8 % (ref 11.0–15.0)
Total Lymphocyte: 25.8 %
WBC: 9.9 10*3/uL (ref 3.8–10.8)

## 2020-07-15 LAB — COMPLETE METABOLIC PANEL WITH GFR
AG Ratio: 1.7 (calc) (ref 1.0–2.5)
ALT: 46 U/L (ref 9–46)
AST: 28 U/L (ref 10–35)
Albumin: 4.8 g/dL (ref 3.6–5.1)
Alkaline phosphatase (APISO): 135 U/L (ref 35–144)
BUN: 16 mg/dL (ref 7–25)
CO2: 28 mmol/L (ref 20–32)
Calcium: 10.5 mg/dL — ABNORMAL HIGH (ref 8.6–10.3)
Chloride: 103 mmol/L (ref 98–110)
Creat: 1.12 mg/dL (ref 0.70–1.33)
GFR, Est African American: 83 mL/min/{1.73_m2} (ref >=60)
GFR, Est Non African American: 72 mL/min/{1.73_m2} (ref >=60)
Globulin: 2.8 g/dL (calc) (ref 1.9–3.7)
Glucose, Bld: 165 mg/dL — ABNORMAL HIGH (ref 65–99)
Potassium: 3.3 mmol/L — ABNORMAL LOW (ref 3.5–5.3)
Sodium: 142 mmol/L (ref 135–146)
Total Bilirubin: 0.5 mg/dL (ref 0.2–1.2)
Total Protein: 7.6 g/dL (ref 6.1–8.1)

## 2020-07-15 LAB — LIPID PANEL
Cholesterol: 137 mg/dL (ref <200)
HDL: 50 mg/dL (ref >=40)
LDL Cholesterol (Calc): 67 mg/dL (calc)
Non-HDL Cholesterol (Calc): 87 mg/dL (calc) (ref <130)
Total CHOL/HDL Ratio: 2.7 (calc) (ref <5.0)
Triglycerides: 116 mg/dL (ref <150)

## 2020-07-15 LAB — URINALYSIS, ROUTINE W REFLEX MICROSCOPIC
Bilirubin Urine: NEGATIVE
Hgb urine dipstick: NEGATIVE
Ketones, ur: NEGATIVE
Leukocytes,Ua: NEGATIVE
Nitrite: NEGATIVE
Protein, ur: NEGATIVE
Specific Gravity, Urine: 1.009 (ref 1.001–1.03)
pH: 6.5 (ref 5.0–8.0)

## 2020-07-15 LAB — MICROALBUMIN / CREATININE URINE RATIO
Creatinine, Urine: 42 mg/dL (ref 20–320)
Microalb Creat Ratio: 17 mcg/mg creat (ref <30)
Microalb, Ur: 0.7 mg/dL

## 2020-07-15 LAB — HEMOGLOBIN A1C
Hgb A1c MFr Bld: 6 % of total Hgb — ABNORMAL HIGH (ref <5.7)
Mean Plasma Glucose: 126 (calc)
eAG (mmol/L): 7 (calc)

## 2020-07-15 LAB — MAGNESIUM: Magnesium: 2 mg/dL (ref 1.5–2.5)

## 2020-07-15 LAB — TSH: TSH: 3.69 mIU/L (ref 0.40–4.50)

## 2020-07-15 LAB — PSA: PSA: 0.93 ng/mL (ref <=4.0)

## 2020-09-29 ENCOUNTER — Other Ambulatory Visit: Payer: Self-pay | Admitting: Internal Medicine

## 2020-09-29 DIAGNOSIS — E118 Type 2 diabetes mellitus with unspecified complications: Secondary | ICD-10-CM

## 2020-09-29 MED ORDER — GLIPIZIDE 5 MG PO TABS: ORAL_TABLET | ORAL | 0 refills | Status: DC

## 2020-10-06 DIAGNOSIS — C44722 Squamous cell carcinoma of skin of right lower limb, including hip: Secondary | ICD-10-CM | POA: Diagnosis not present

## 2020-11-03 DIAGNOSIS — S91104D Unspecified open wound of right lesser toe(s) without damage to nail, subsequent encounter: Secondary | ICD-10-CM | POA: Diagnosis not present

## 2020-11-14 ENCOUNTER — Other Ambulatory Visit: Payer: Self-pay | Admitting: Internal Medicine

## 2020-11-14 DIAGNOSIS — E118 Type 2 diabetes mellitus with unspecified complications: Secondary | ICD-10-CM

## 2020-11-16 NOTE — Progress Notes (Signed)
3 MONTH FOLLOW UP  Assessment and Plan:   White coat syndrome with hypertension Well controlled per home log; continue current treatment  Monitor blood pressure at home; call if consistently over 130/80 Continue DASH diet.   Reminder to go to the ER if any CP, SOB, nausea, dizziness, severe HA, changes vision/speech, left arm numbness and tingling and jaw pain. -     CBC with Differential/Platelet -     COMPLETE METABOLIC PANEL WITH GFR -     Magnesium  History of CVA (cerebrovascular accident) Control blood pressure, cholesterol, increase exercise, ASA  T2_NIDDM [facility]) Education: Reviewed 'ABCs' of diabetes management (respective goals in parentheses):  A1C (<7), blood pressure (<130/80), and cholesterol (LDL <70) Eye Exam yearly and Dental Exam every 6 months.- exam report requested Foot exam completed Dietary recommendations Physical Activity recommendations Checking regularly - maintaining well without concerning sx of hypoglycemia; reduce glipizide if needed -     COMPLETE METABOLIC PANEL WITH GFR -     Hemoglobin A1c  Hyperlipidemia associated with type 2 diabetes mellitus ([place]) Continue medications, LDL goal <70 Continue low cholesterol diet and exercise.  Check lipid panel.  -     Lipid panel -     TSH  CKD stage 2 due to type 2 diabetes mellitus (HCC) Increase fluids, avoid NSAIDS, monitor sugars, will monitor -     COMPLETE METABOLIC PANEL WITH GFR  Vitamin D deficiency Start supplement; defer check due to insurance; try drops or wafers  Medication management -     CBC with Differential/Platelet -     COMPLETE METABOLIC PANEL WITH GFR -     Magnesium  Overweight (BMI 25.0-29.9) Long discussion about weight loss, diet, and exercise Recommended diet heavy in fruits and veggies and low in animal meats, cheeses, and dairy products, appropriate calorie intake Discussed appropriate weight for height Follow up at next visit  Severe needle phobia Benzo if needed;  limit lab sticks to minimum necessary  Poor compliance with medication Significantly improved; continue to monitor; emphasized risks of non-compliance; patient appears motivated, denies barriers; keep pill burden low if possible    Onychomycosis Onychomycosis- has tried OTC meds, willing to try Lamisil, side effects discussed, will follow up in 6 weeks for LFTs (start 01/11/2021 and will get labs as scheduled in July), cheapest at [facility]   Doing well with home log, last labs were good, severe needle phobia; after discussion will defer labs today and plan to get next OV in July    Discussed med's effects and SE's. Screening labs and tests as requested with regular follow-up as recommended. Over 40 minutes of exam, counseling, chart review and critical decision making was performed  Future Appointments  Date Time Provider [place]  02/22/2021  9:30 AM Liane Comber, NP [facility] None  07/17/2021  3:00 PM Liane Comber, NP [facility] None     HPI 60 y.o. patient presents for 3 month follow up for T2DM with CKD, htn, hyperlipidemia, vit D def.   He had R toe skin cancer removed by skin surgery center, clean margins, no follow up needed. WIll request report.   Patient has hx/o Severe labile HTN predating since 3299 complicated by poor insight /comprehension and very poor compliance attributed to his anxiety re: having venipuncture. In July 2020, patient was hospitalized with a left cerebellar CVA. Cardiac 2D echo, Carotid Aa U/S, Bubble test and TEE were negative. He was started on ASA & Plavix.     He has been  seeing ortho for cervical pain with radiculopathy, has been prescribed gabapentin, vicodin and ? Meloxicam, hasn't taken, discussed injections but needle phobia and rather be in pain.   BMI is Body mass index is 27.12 kg/m., he has been working on diet and exercise.He drinks 8+ glasses of water daily, rare soda, does admit to 5 cups of coffee daily,  resistant to reducing.  Wt Readings from Last 3 Encounters:  11/17/20 181 lb (82.1 kg)  07/14/20 179 lb (81.2 kg)  03/18/20 185 lb (83.9 kg)   His blood pressure has been controlled at home (120-130s/80s), today their BP is BP: (!) 160/88 Long hx of white coat hypertension He does workout. He denies chest pain, shortness of breath, dizziness.   He is on cholesterol medication and denies myalgias. His cholesterol is at goal. The cholesterol last visit was:   Lab Results  Component Value Date   CHOL 137 07/14/2020   HDL 50 07/14/2020   LDLCALC 67 07/14/2020   TRIG 116 07/14/2020   CHOLHDL 2.7 07/14/2020   He has been working on diet and exercise for T2DM with CKD (A1C 10.1% in 05/2019), much improved by lifestyle and metformin (taking 1 tab daily only, has GI SE with higher dose, also taking glipizide 2.5 mg BID), he is on bASA, he is on ACE/ARB and denies increased appetite, nausea, paresthesia of the feet, polydipsia, polyuria and visual disturbances.  He reports checking fasting glucose at least weekly, typically right at 100, rarely up 120.  Last A1C in the office was:  Lab Results  Component Value Date   HGBA1C 6.0 (H) 07/14/2020   Last GFR: Lab Results  Component Value Date   GFRNONAA 72 07/14/2020   GFRNONAA 56 (L) 03/09/2020   GFRNONAA 82 10/13/2019   Patient is not on Vitamin D supplement,  Lab Results  Component Value Date   VD25OH 30 10/13/2019       Current Medications:  Current Outpatient Medications on File Prior to Visit  Medication Sig Dispense Refill  . amLODipine (NORVASC) 10 MG tablet Take 1 tablet at Bedtime for BP 90 tablet 1  . aspirin EC 81 MG EC tablet Take 1 tablet (81 mg total) by mouth daily. 90 tablet 0  . atorvastatin (LIPITOR) 80 MG tablet TAKE ONE TABLET BY MOUTH DAILY FOR CHOLESTEROL 90 tablet 0  . GABAPENTIN PO Take by mouth 3 (three) times daily.    Marland Kitchen glipiZIDE (GLUCOTROL) 5 MG tablet Take  1/2 to 1 tablet  2 x /day  with Meals  for  Diabetes 180 tablet 0  . losartan-hydrochlorothiazide (HYZAAR) 100-25 MG tablet Take 1 tablet Daily for BP. 90 tablet 1  . metFORMIN (GLUCOPHAGE-XR) 500 MG 24 hr tablet Take  1 to 2 tablets  2 x /day  with Meals  for Diabetes 360 tablet 1  . Omega-3 Fatty Acids (FISH OIL PO) Take by mouth daily. (Patient not taking: Reported on 11/17/2020)     No current facility-administered medications on file prior to visit.   Allergies:  Allergies  Allergen Reactions  . Atenolol Other (See Comments)    Made tired    Patient Care Team: Unk Pinto, MD as PCP - General (Internal Medicine)  Medical History:  has Severe needle phobia; Hyperlipidemia associated with type 2 diabetes mellitus ([place]); White coat syndrome with hypertension; T2_NIDDM ([place]); Vitamin D deficiency; Medication management; Overweight (BMI 25.0-29.9); Poor compliance with medication; Amaurosis fugax; History of CVA (cerebrovascular accident); Hypertension; CKD stage 2 due to type 2  diabetes mellitus ([place]); Family history of heart attack; Skin lesion of foot; and History of skin cancer on their problem list. Surgical History:  He  has a past surgical history that includes TEE without cardioversion (N/A, 03/02/2019); Bubble study (03/02/2019); and Refractive surgery (Bilateral, 1991). Family History:  His family history includes Dementia (age of onset: 22) in his father; Heart attack in his maternal grandfather; Heart attack (age of onset: 55) in his paternal grandfather; Heart disease in his father; Hyperlipidemia in his father. Social History:   reports that he has never smoked. He has never used smokeless tobacco. He reports that he does not drink alcohol and does not use drugs.   Review of Systems:  Review of Systems  Constitutional: Negative for malaise/fatigue and weight loss.  HENT: Negative for hearing loss and tinnitus.   Eyes: Negative for blurred vision and double vision.  Respiratory: Negative for cough, shortness of  breath and wheezing.   Cardiovascular: Negative for chest pain, palpitations, orthopnea, claudication and leg swelling.  Gastrointestinal: Negative for abdominal pain, blood in stool, constipation, diarrhea, heartburn, melena, nausea and vomiting.  Genitourinary: Negative.   Musculoskeletal: Negative for joint pain and myalgias.  Skin: Negative for rash.  Neurological: Negative for dizziness, tingling, sensory change, weakness and headaches.  Endo/Heme/Allergies: Negative for polydipsia.  Psychiatric/Behavioral: Negative.   All other systems reviewed and are negative.   Physical Exam: Estimated body mass index is 27.12 kg/m as calculated from the following:   Height as of 07/14/20: 5' 8.5" (1.74 m).   Weight as of this encounter: 181 lb (82.1 kg). BP (!) 160/88   Pulse 63   Temp (!) 97.5 F (36.4 C)   Wt 181 lb (82.1 kg)   SpO2 97%   BMI 27.12 kg/m  General Appearance: Well nourished, in no apparent distress.  Eyes: PERRLA, EOMs, conjunctiva no swelling or erythema Sinuses: No Frontal/maxillary tenderness  ENT/Mouth: Ext aud canals clear, normal light reflex with TMs without erythema, bulging. Good dentition. No erythema, swelling, or exudate on post pharynx. Tonsils not swollen or erythematous. Hearing normal.  Neck: Supple, thyroid normal. No bruits  Respiratory: Respiratory effort normal, BS equal bilaterally without rales, rhonchi, wheezing or stridor.  Cardio: RRR without murmurs, rubs or gallops. Brisk peripheral pulses without edema.  Chest: symmetric, with normal excursions and percussion.  Abdomen: Soft, nontender, no guarding, rebound, hernias, masses, or organomegaly.  Lymphatics: Non tender without lymphadenopathy.  Genitourinary: no concerns, defer Musculoskeletal: Full ROM all peripheral extremities,5/5 strength, and normal gait.  Skin: Warm, dry without rashes, ecchymosis. Bil feet onychomycosis. Well healed R 3rd digit lateral incision site.  Neuro: Cranial nerves  intact, reflexes equal bilaterally. Normal muscle tone, no cerebellar symptoms. Sensation intact bil upper and lower extremities.  Psych: Awake and oriented X 3, normal affect, Insight and Judgment appropriate.     Gabriel Hoover 10:50 AM [facility]

## 2020-11-17 ENCOUNTER — Ambulatory Visit: Payer: PRIVATE HEALTH INSURANCE | Admitting: Adult Health

## 2020-11-17 ENCOUNTER — Encounter: Payer: Self-pay | Admitting: Adult Health

## 2020-11-17 ENCOUNTER — Other Ambulatory Visit: Payer: Self-pay

## 2020-11-17 VITALS — BP 160/88 | HR 63 | Temp 97.5°F | Wt 181.0 lb

## 2020-11-17 DIAGNOSIS — Z8673 Personal history of transient ischemic attack (TIA), and cerebral infarction without residual deficits: Secondary | ICD-10-CM

## 2020-11-17 DIAGNOSIS — Z85828 Personal history of other malignant neoplasm of skin: Secondary | ICD-10-CM

## 2020-11-17 DIAGNOSIS — E119 Type 2 diabetes mellitus without complications: Secondary | ICD-10-CM | POA: Diagnosis not present

## 2020-11-17 DIAGNOSIS — I10 Essential (primary) hypertension: Secondary | ICD-10-CM | POA: Diagnosis not present

## 2020-11-17 DIAGNOSIS — E663 Overweight: Secondary | ICD-10-CM

## 2020-11-17 DIAGNOSIS — E1122 Type 2 diabetes mellitus with diabetic chronic kidney disease: Secondary | ICD-10-CM | POA: Diagnosis not present

## 2020-11-17 DIAGNOSIS — E1169 Type 2 diabetes mellitus with other specified complication: Secondary | ICD-10-CM | POA: Diagnosis not present

## 2020-11-17 DIAGNOSIS — E785 Hyperlipidemia, unspecified: Secondary | ICD-10-CM

## 2020-11-17 DIAGNOSIS — N182 Chronic kidney disease, stage 2 (mild): Secondary | ICD-10-CM

## 2020-11-17 DIAGNOSIS — Z79899 Other long term (current) drug therapy: Secondary | ICD-10-CM

## 2020-11-17 DIAGNOSIS — E559 Vitamin D deficiency, unspecified: Secondary | ICD-10-CM

## 2020-11-17 MED ORDER — TERBINAFINE HCL 250 MG PO TABS: 250.0000 mg | ORAL_TABLET | Freq: Every day | ORAL | 0 refills | Status: AC

## 2020-11-17 NOTE — Patient Instructions (Signed)
Goals    . Blood Pressure < 130/80         Terbinafine tablets What is this medicine? TERBINAFINE (TER bin a feen) is an antifungal medicine. It is used to treat certain kinds of fungal or yeast infections. This medicine may be used for other purposes; ask your health care provider or pharmacist if you have questions. COMMON BRAND NAME(S): Lamisil, Terbinex What should I tell my health care provider before I take this medicine? They need to know if you have any of these conditions:  drink alcoholic beverages  kidney disease  liver disease  an unusual or allergic reaction to terbinafine, other medicines, foods, dyes, or preservatives  pregnant or trying to get pregnant  breast-feeding How should I use this medicine? Take this medicine by mouth with a full glass of water. Follow the directions on the prescription label. You can take this medicine with food or on an empty stomach. Take your medicine at regular intervals. Do not take your medicine more often than directed. Do not skip doses or stop your medicine early even if you feel better. Do not stop taking except on your doctor's advice. A special MedGuide will be given to you by the pharmacist with each prescription and refill. Be sure to read this information carefully each time. Talk to your pediatrician regarding the use of this medicine in children. Special care may be needed. Overdosage: If you think you have taken too much of this medicine contact a poison control center or emergency room at once. NOTE: This medicine is only for you. Do not share this medicine with others. What if I miss a dose? If you miss a dose, take it as soon as you can. If it is almost time for your next dose, take only that dose. Do not take double or extra doses. What may interact with this medicine? Do not take this medicine with any of the following medications:  thioridazine This medicine may also interact with the following  medications:  beta-blockers  caffeine  cimetidine  cyclosporine  medicines for depression, anxiety, or psychotic disturbances  medicines for fungal infections like fluconazole and ketoconazole  medicines for irregular heartbeat like amiodarone, flecainide and propafenone  rifampin  warfarin This list may not describe all possible interactions. Give your health care provider a list of all the medicines, herbs, non-prescription drugs, or dietary supplements you use. Also tell them if you smoke, drink alcohol, or use illegal drugs. Some items may interact with your medicine. What should I watch for while using this medicine? Visit your doctor or health care provider regularly. Tell your doctor right away if you have nausea or vomiting, loss of appetite, stomach pain on your right upper side, yellow skin, dark urine, light stools, or are over tired. Some fungal infections need many weeks or months of treatment to cure. If you are taking this medicine for a long time, you will need to have important blood work done. This medicine may cause serious skin reactions. They can happen weeks to months after starting the medicine. Contact your health care provider right away if you notice fevers or flu-like symptoms with a rash. The rash may be red or purple and then turn into blisters or peeling of the skin. Or, you might notice a red rash with swelling of the face, lips or lymph nodes in your neck or under your arms. What side effects may I notice from receiving this medicine? Side effects that you should report to your  doctor or health care professional as soon as possible:  allergic reactions like skin rash or hives, swelling of the face, lips, or tongue  changes in vision  dark urine  fever or infection  general ill feeling or flu-like symptoms  light-colored stools  loss of appetite, nausea  rash, fever, and swollen lymph nodes  redness, blistering, peeling or loosening of the  skin, including inside the mouth  right upper belly pain  unusually weak or tired  yellowing of the eyes or skin Side effects that usually do not require medical attention (report to your doctor or health care professional if they continue or are bothersome):  changes in taste  diarrhea  hair loss  muscle or joint pain  stomach gas  stomach upset This list may not describe all possible side effects. Call your doctor for medical advice about side effects. You may report side effects to FDA at 1-800-FDA-1088. Where should I keep my medicine? Keep out of the reach of children. Store at room temperature below 25 degrees C (77 degrees F). Protect from light. Throw away any unused medicine after the expiration date. NOTE: This sheet is a summary. It may not cover all possible information. If you have questions about this medicine, talk to your doctor, pharmacist, or health care provider.  2021 Elsevier/Gold Standard (2018-11-07 15:37:07)        High-Fiber Eating Plan Fiber, also called dietary fiber, is a type of carbohydrate. It is found foods such as fruits, vegetables, whole grains, and beans. A high-fiber diet can have many health benefits. Your health care provider may recommend a high-fiber diet to help:  Prevent constipation. Fiber can make your bowel movements more regular.  Lower your cholesterol.  Relieve the following conditions: ? Inflammation of veins in the anus (hemorrhoids). ? Inflammation of specific areas of the digestive tract (uncomplicated diverticulosis). ? A problem of the large intestine, also called the colon, that sometimes causes pain and diarrhea (irritable bowel syndrome, or IBS).  Prevent overeating as part of a weight-loss plan.  Prevent heart disease, type 2 diabetes, and certain cancers. What are tips for following this plan? Reading food labels  Check the nutrition facts label on food products for the amount of dietary fiber. Choose  foods that have 5 grams of fiber or more per serving.  The goals for recommended daily fiber intake include: ? Men (age 24 or younger): 34-38 g. ? Men (over age 55): 28-34 g. ? Women (age 40 or younger): 25-28 g. ? Women (over age 46): 22-25 g. Your daily fiber goal is _____________ g.   Shopping  Choose whole fruits and vegetables instead of processed forms, such as apple juice or applesauce.  Choose a wide variety of high-fiber foods such as avocados, lentils, oats, and kidney beans.  Read the nutrition facts label of the foods you choose. Be aware of foods with added fiber. These foods often have high sugar and sodium amounts per serving. Cooking  Use whole-grain flour for baking and cooking.  Cook with brown rice instead of white rice. Meal planning  Start the day with a breakfast that is high in fiber, such as a cereal that contains 5 g of fiber or more per serving.  Eat breads and cereals that are made with whole-grain flour instead of refined flour or white flour.  Eat brown rice, bulgur wheat, or millet instead of white rice.  Use beans in place of meat in soups, salads, and pasta dishes.  Be sure  that half of the grains you eat each day are whole grains. General information  You can get the recommended daily intake of dietary fiber by: ? Eating a variety of fruits, vegetables, grains, nuts, and beans. ? Taking a fiber supplement if you are not able to take in enough fiber in your diet. It is better to get fiber through food than from a supplement.  Gradually increase how much fiber you consume. If you increase your intake of dietary fiber too quickly, you may have bloating, cramping, or gas.  Drink plenty of water to help you digest fiber.  Choose high-fiber snacks, such as berries, raw vegetables, nuts, and popcorn. What foods should I eat? Fruits Berries. Pears. Apples. Oranges. Avocado. Prunes and raisins. Dried figs. Vegetables Sweet potatoes. Spinach. Kale.  Artichokes. Cabbage. Broccoli. Cauliflower. Green peas. Carrots. Squash. Grains Whole-grain breads. Multigrain cereal. Oats and oatmeal. Brown rice. Barley. Bulgur wheat. [place]. Quinoa. Bran muffins. Popcorn. Rye wafer crackers. Meats and other proteins Navy beans, kidney beans, and pinto beans. Soybeans. Split peas. Lentils. Nuts and seeds. Dairy Fiber-fortified yogurt. Beverages Fiber-fortified soy milk. Fiber-fortified orange juice. Other foods Fiber bars. The items listed above may not be a complete list of recommended foods and beverages. Contact a dietitian for more information. What foods should I avoid? Fruits Fruit juice. Cooked, strained fruit. Vegetables Fried potatoes. Canned vegetables. Well-cooked vegetables. Grains White bread. Pasta made with refined flour. White rice. Meats and other proteins Fatty cuts of meat. Fried chicken or fried fish. Dairy Milk. Yogurt. Cream cheese. Sour cream. Fats and oils Butters. Beverages Soft drinks. Other foods Cakes and pastries. The items listed above may not be a complete list of foods and beverages to avoid. Talk with your dietitian about what choices are best for you. Summary  Fiber is a type of carbohydrate. It is found in foods such as fruits, vegetables, whole grains, and beans.  A high-fiber diet has many benefits. It can help to prevent constipation, lower blood cholesterol, aid weight loss, and reduce your risk of heart disease, diabetes, and certain cancers.  Increase your intake of fiber gradually. Increasing fiber too quickly may cause cramping, bloating, and gas. Drink plenty of water while you increase the amount of fiber you consume.  The best sources of fiber include whole fruits and vegetables, whole grains, nuts, seeds, and beans. This information is not intended to replace advice given to you by your health care provider. Make sure you discuss any questions you have with your health care provider. Document  Revised: 12/03/2019 Document Reviewed: 12/03/2019 Elsevier Patient Education  2021 [employer].

## 2020-11-30 ENCOUNTER — Encounter: Payer: Self-pay | Admitting: Internal Medicine

## 2020-12-10 ENCOUNTER — Other Ambulatory Visit: Payer: Self-pay | Admitting: Internal Medicine

## 2020-12-10 DIAGNOSIS — E118 Type 2 diabetes mellitus with unspecified complications: Secondary | ICD-10-CM

## 2020-12-10 MED ORDER — GLIPIZIDE 5 MG PO TABS: ORAL_TABLET | ORAL | 1 refills | Status: DC

## 2020-12-12 ENCOUNTER — Other Ambulatory Visit: Payer: Self-pay

## 2020-12-12 DIAGNOSIS — E785 Hyperlipidemia, unspecified: Secondary | ICD-10-CM

## 2020-12-12 DIAGNOSIS — E1169 Type 2 diabetes mellitus with other specified complication: Secondary | ICD-10-CM

## 2020-12-12 MED ORDER — ATORVASTATIN CALCIUM 80 MG PO TABS: ORAL_TABLET | ORAL | 1 refills | Status: DC

## 2021-02-08 ENCOUNTER — Other Ambulatory Visit: Payer: Self-pay | Admitting: Adult Health

## 2021-02-08 DIAGNOSIS — I10 Essential (primary) hypertension: Secondary | ICD-10-CM

## 2021-02-20 NOTE — Progress Notes (Signed)
3 MONTH FOLLOW UP  Assessment and Plan:   White coat syndrome with hypertension Well controlled per home log; continue current treatment  Monitor blood pressure at home; call if consistently over 130/80 Continue DASH diet.   Reminder to go to the ER if any CP, SOB, nausea, dizziness, severe HA, changes vision/speech, left arm numbness and tingling and jaw pain. -     CBC with Differential/Platelet -     COMPLETE METABOLIC PANEL WITH GFR -     Magnesium  History of CVA (cerebrovascular accident) Control blood pressure, cholesterol, increase exercise Stop ASA, start plavix - patient preference for fewer meds  T2_NIDDM [facility]) Education: Reviewed 'ABCs' of diabetes management (respective goals in parentheses):  A1C (<7), blood pressure (<130/80), and cholesterol (LDL <70) Eye Exam yearly and Dental Exam every 6 months.- exam report requested Dietary recommendations Physical Activity recommendations Checking regularly - maintaining well without concerning sx of hypoglycemia; reduce glipizide if needed -     COMPLETE METABOLIC PANEL WITH GFR -     Hemoglobin A1c  Hyperlipidemia associated with type 2 diabetes mellitus (HCC) Continue medications, LDL goal <70 Continue low cholesterol diet and exercise.  Check lipid panel.  -     Lipid panel -     TSH  CKD stage 2 due to type 2 diabetes mellitus (HCC) Increase fluids, avoid NSAIDS, monitor sugars, will monitor -     COMPLETE METABOLIC PANEL WITH GFR  Vitamin D deficiency Continue supplement; defer check to CPE due to insurance  Medication management -     CBC with Differential/Platelet -     COMPLETE METABOLIC PANEL WITH GFR -     Magnesium  Overweight (BMI 25.0-29.9) Long discussion about weight loss, diet, and exercise Recommended diet heavy in fruits and veggies and low in animal meats, cheeses, and dairy products, appropriate calorie intake Discussed appropriate weight for height Follow up at next visit  Severe needle  phobia Benzo if needed; limit lab sticks to minimum necessary  Poor compliance with medication Significantly improved; continue to monitor; emphasized risks of non-compliance; patient appears motivated, denies barriers; keep pill burden low if possible    Onychomycosis Improved with lamisili, check LFTs, complete script as written  Fatigue Afternoon only without accompaniments Check BP, pulse, glucose Avoid heavy meal at lunch Follow up if notes any new/specific sx   Discussed med's effects and SE's. Screening labs and tests as requested with regular follow-up as recommended. Over 40 minutes of exam, counseling, chart review and critical decision making was performed  Future Appointments  Date Time Provider [place]  07/17/2021  3:00 PM Liane Comber, NP [facility] None     HPI 60 y.o. patient presents for 3 month follow up for T2DM with CKD, htn, hyperlipidemia, vit D def.   He had R toe SCC removed by skin surgery center, clean margins, no follow up needed.   Patient has hx/o Severe labile HTN predating since 3329 complicated by poor insight /comprehension and very poor compliance attributed to his anxiety re: having venipuncture. In July 2020, patient was hospitalized with a left cerebellar CVA. Cardiac 2D echo, Carotid Aa U/S, Bubble test and TEE were negative. He was started on ASA & Plavix, he reports never started aspirin.    He has been seeing ortho for cervical pain with radiculopathy, has been prescribed gabapentin, taking 2 at night, discussed injections but needle phobia and rather be in pain. He reports has a new neck pillow that is helping significantly.  He has onychomycosis, was prescribed lamisil which he started 01/11/2021, has noted benefit already.   BMI is Body mass index is 26.52 kg/m., he has been working on diet and exercise.He drinks 8+ glasses of water daily, rare soda, has reduced coffee from 5/day to 2 per my recommendation.  Wt Readings from  Last 3 Encounters:  02/22/21 177 lb (80.3 kg)  11/17/20 181 lb (82.1 kg)  07/14/20 179 lb (81.2 kg)   His blood pressure has been controlled at home (<130/70s), today their BP is BP: 140/80 Long hx of white coat hypertension.  He does workout. He denies chest pain, shortness of breath, dizziness.   He is on cholesterol medication and denies myalgias. His cholesterol is at goal. The cholesterol last visit was:   Lab Results  Component Value Date   CHOL 137 07/14/2020   HDL 50 07/14/2020   LDLCALC 67 07/14/2020   TRIG 116 07/14/2020   CHOLHDL 2.7 07/14/2020   He has been working on diet and exercise for T2DM with CKD (A1C 10.1% in 05/2019), much improved by lifestyle and metformin (taking 1 tab daily only, has GI SE with higher dose, also taking glipizide 2.5 mg BID), he is on bASA, he is on ACE/ARB and denies increased appetite, nausea, paresthesia of the feet, polydipsia, polyuria and visual disturbances.  He admits hasn't been checking glucose, needle phobia, "I feel fine"  Last A1C in the office was:  Lab Results  Component Value Date   HGBA1C 6.0 (H) 07/14/2020   Last GFR: Lab Results  Component Value Date   GFRNONAA 72 07/14/2020   GFRNONAA 56 (L) 03/09/2020   GFRNONAA 82 10/13/2019   Patient is not on Vitamin D supplement,  Lab Results  Component Value Date   VD25OH 30 10/13/2019       Current Medications:  Current Outpatient Medications on File Prior to Visit  Medication Sig Dispense Refill   amLODipine (NORVASC) 10 MG tablet Take 1 tablet at Bedtime for BP 90 tablet 1   aspirin EC 81 MG EC tablet Take 1 tablet (81 mg total) by mouth daily. 90 tablet 0   atorvastatin (LIPITOR) 80 MG tablet TAKE ONE TABLET BY MOUTH DAILY FOR CHOLESTEROL 90 tablet 1   GABAPENTIN PO Take by mouth 3 (three) times daily.     glipiZIDE (GLUCOTROL) 5 MG tablet Take  1/2 to 1 tablet  2 x /day  with Meals  for Diabetes 180 tablet 1   losartan-hydrochlorothiazide (HYZAAR) 100-25 MG tablet  Take n 1 tablet  Daily  for BP 90 tablet 1   metFORMIN (GLUCOPHAGE-XR) 500 MG 24 hr tablet Take  1 to 2 tablets  2 x /day  with Meals  for Diabetes 360 tablet 1   No current facility-administered medications on file prior to visit.   Allergies:  Allergies  Allergen Reactions   Atenolol Other (See Comments)    Made tired    Patient Care Team: Unk Pinto, MD as PCP - General (Internal Medicine)  Medical History:  has Severe needle phobia; Hyperlipidemia associated with type 2 diabetes mellitus ([place]); White coat syndrome with hypertension; T2_NIDDM ([place]); Vitamin D deficiency; Medication management; Overweight (BMI 25.0-29.9); Poor compliance with medication; Amaurosis fugax; History of CVA (cerebrovascular accident); Hypertension; CKD stage 2 due to type 2 diabetes mellitus ([place]); Family history of heart attack; and History of SCC (squamous cell carcinoma) of skin on their problem list. Surgical History:  He  has a past surgical history that includes TEE without  cardioversion (N/A, 03/02/2019); Bubble study (03/02/2019); and Refractive surgery (Bilateral, 1991). Family History:  His family history includes Dementia (age of onset: 54) in his father; Heart attack in his maternal grandfather; Heart attack (age of onset: 30) in his paternal grandfather; Heart disease in his father; Hyperlipidemia in his father. Social History:   reports that he has never smoked. He has never used smokeless tobacco. He reports that he does not drink alcohol and does not use drugs.   Review of Systems:  Review of Systems  Constitutional:  Positive for malaise/fatigue (last 6 months, consistently 1pm-3-4pm without other accompaniments). Negative for weight loss.  HENT:  Negative for hearing loss and tinnitus.   Eyes:  Negative for blurred vision and double vision.  Respiratory:  Negative for cough, shortness of breath and wheezing.   Cardiovascular:  Negative for chest pain, palpitations, orthopnea,  claudication and leg swelling.  Gastrointestinal:  Negative for abdominal pain, blood in stool, constipation, diarrhea, heartburn, melena, nausea and vomiting.  Genitourinary: Negative.   Musculoskeletal:  Negative for joint pain and myalgias.  Skin:  Negative for rash.  Neurological:  Negative for dizziness, tingling, sensory change, weakness and headaches.  Endo/Heme/Allergies:  Negative for polydipsia.  Psychiatric/Behavioral: Negative.    All other systems reviewed and are negative.  Physical Exam: Estimated body mass index is 26.52 kg/m as calculated from the following:   Height as of 07/14/20: 5' 8.5" (1.74 m).   Weight as of this encounter: 177 lb (80.3 kg). BP 140/80   Pulse 62   Temp 97.7 F (36.5 C)   Wt 177 lb (80.3 kg)   SpO2 98%   BMI 26.52 kg/m  General Appearance: Well nourished, in no apparent distress.  Eyes: PERRLA, EOMs, conjunctiva no swelling or erythema Sinuses: No Frontal/maxillary tenderness  ENT/Mouth: Ext aud canals clear, normal light reflex with TMs without erythema, bulging. Good dentition. No erythema, swelling, or exudate on post pharynx. Tonsils not swollen or erythematous. Hearing normal.  Neck: Supple, thyroid normal. No bruits  Respiratory: Respiratory effort normal, BS equal bilaterally without rales, rhonchi, wheezing or stridor.  Cardio: RRR without murmurs, rubs or gallops. Brisk peripheral pulses without edema.  Abdomen: Soft, nontender, no guarding, rebound, hernias, masses, or organomegaly.  Lymphatics: Non tender without lymphadenopathy.  Musculoskeletal: Full ROM all peripheral extremities,5/5 strength, and normal gait.  Skin: Warm, dry without rashes, ecchymosis. Bil feet onychomycosis. Well healed R 3rd digit lateral incision site.  Neuro: Cranial nerves intact, reflexes equal bilaterally. Normal muscle tone, no cerebellar symptoms. Sensation intact bil upper and lower extremities.  Psych: Awake and oriented X 3, normal affect, Insight  and Judgment appropriate.    Gabriel Hoover 9:48 AM [facility]

## 2021-02-22 ENCOUNTER — Encounter: Payer: Self-pay | Admitting: Adult Health

## 2021-02-22 ENCOUNTER — Other Ambulatory Visit: Payer: Self-pay

## 2021-02-22 ENCOUNTER — Ambulatory Visit: Payer: PRIVATE HEALTH INSURANCE | Admitting: Adult Health

## 2021-02-22 VITALS — BP 140/80 | HR 62 | Temp 97.7°F | Wt 177.0 lb

## 2021-02-22 DIAGNOSIS — Z9114 Patient's other noncompliance with medication regimen: Secondary | ICD-10-CM

## 2021-02-22 DIAGNOSIS — E119 Type 2 diabetes mellitus without complications: Secondary | ICD-10-CM

## 2021-02-22 DIAGNOSIS — I10 Essential (primary) hypertension: Secondary | ICD-10-CM | POA: Diagnosis not present

## 2021-02-22 DIAGNOSIS — E1122 Type 2 diabetes mellitus with diabetic chronic kidney disease: Secondary | ICD-10-CM

## 2021-02-22 DIAGNOSIS — E559 Vitamin D deficiency, unspecified: Secondary | ICD-10-CM

## 2021-02-22 DIAGNOSIS — E785 Hyperlipidemia, unspecified: Secondary | ICD-10-CM

## 2021-02-22 DIAGNOSIS — F40231 Fear of injections and transfusions: Secondary | ICD-10-CM

## 2021-02-22 DIAGNOSIS — E1169 Type 2 diabetes mellitus with other specified complication: Secondary | ICD-10-CM | POA: Diagnosis not present

## 2021-02-22 DIAGNOSIS — E663 Overweight: Secondary | ICD-10-CM | POA: Diagnosis not present

## 2021-02-22 DIAGNOSIS — Z91148 Patient's other noncompliance with medication regimen for other reason: Secondary | ICD-10-CM

## 2021-02-22 DIAGNOSIS — Z8673 Personal history of transient ischemic attack (TIA), and cerebral infarction without residual deficits: Secondary | ICD-10-CM

## 2021-02-22 DIAGNOSIS — N182 Chronic kidney disease, stage 2 (mild): Secondary | ICD-10-CM

## 2021-02-22 DIAGNOSIS — Z79899 Other long term (current) drug therapy: Secondary | ICD-10-CM | POA: Diagnosis not present

## 2021-02-22 DIAGNOSIS — Z85828 Personal history of other malignant neoplasm of skin: Secondary | ICD-10-CM

## 2021-02-22 MED ORDER — CLOPIDOGREL BISULFATE 75 MG PO TABS: 75.0000 mg | ORAL_TABLET | Freq: Every day | ORAL | 3 refills | Status: DC

## 2021-02-22 NOTE — Patient Instructions (Addendum)
Recommend checking blood pressure, pulse and sugar in the afternoon when feeling fatigued  Try to avoid processed carbohydrates or very large amounts of meat at lunch - very heavy meal can cause fatigue  Try to get plenty of sleep - 7+ hours  Try stopping gabapentin at night -  Could start meloxicam back instead for pain which would help with inflammation- if you try ibuprofen/aleve and this helps would be a good option  Please stop baby aspirin and start plavix instead     Fatigue If you have fatigue, you feel tired all the time and have a lack of energy or a lack of motivation. Fatigue may make it difficult to start or complete tasks because of exhaustion. In general, occasional or mild fatigue is often a normal response to activity or life. However, long-lasting (chronic) or extreme fatigue may be a symptom of a medical condition. Follow these instructions at home: General instructions Watch your fatigue for any changes. Go to bed and get up at the same time every day. Avoid fatigue by pacing yourself during the day and getting enough sleep at night. Maintain a healthy weight. Medicines Take over-the-counter and prescription medicines only as told by your health care provider. Take a multivitamin, if told by your health care provider.  Do not use herbal or dietary supplements unless they are approved by your health care provider. Activity  Exercise regularly, as told by your health care provider. Use or practice techniques to help you relax, such as yoga, tai chi, meditation, or massage therapy.  Eating and drinking  Avoid heavy meals in the evening. Eat a well-balanced diet, which includes lean proteins, whole grains, plenty of fruits and vegetables, and low-fat dairy products. Avoid consuming too much caffeine. Avoid the use of alcohol. Drink enough fluid to keep your urine pale yellow.  Lifestyle Change situations that cause you stress. Try to keep your work and personal  schedule in balance. Do not use any products that contain nicotine or tobacco, such as cigarettes and e-cigarettes. If you need help quitting, ask your health care provider. Do not use drugs. Contact a health care provider if: Your fatigue does not get better. You have a fever. You suddenly lose or gain weight. You have headaches. You have trouble falling asleep or sleeping through the night. You feel angry, guilty, anxious, or sad. You are unable to have a bowel movement (constipation). Your skin is dry. You have swelling in your legs or another part of your body. Get help right away if: You feel confused. Your vision is blurry. You feel faint or you pass out. You have a severe headache. You have severe pain in your abdomen, your back, or the area between your waist and hips (pelvis). You have chest pain, shortness of breath, or an irregular or fast heartbeat. You are unable to urinate, or you urinate less than normal. You have abnormal bleeding, such as bleeding from the rectum, vagina, nose, lungs, or nipples. You vomit blood. You have thoughts about hurting yourself or others. If you ever feel like you may hurt yourself or others, or have thoughts about taking your own life, get help right away. You can go to your nearest emergency department or call: Your local emergency services (911 in the [place] A suicide crisis helpline, such as the [place] at (205)012-9876. This is open 24 hours a day. Summary If you have fatigue, you feel tired all the time and have a lack of energy or  a lack of motivation. Fatigue may make it difficult to start or complete tasks because of exhaustion. Long-lasting (chronic) or extreme fatigue may be a symptom of a medical condition. Exercise regularly, as told by your health care provider. Change situations that cause you stress. Try to keep your work and personal schedule in balance. This information is not intended to  replace advice given to you by your health care provider. Make sure you discuss any questions you have with your healthcare provider. Document Revised: 06/09/2020 Document Reviewed: 06/09/2020 Elsevier Patient Education  2022 [employer].

## 2021-02-23 LAB — CBC WITH DIFFERENTIAL/PLATELET
Absolute Monocytes: 705 cells/uL (ref 200–950)
Basophils Absolute: 113 cells/uL (ref 0–200)
Basophils Relative: 1.4 %
Eosinophils Absolute: 227 cells/uL (ref 15–500)
Eosinophils Relative: 2.8 %
HCT: 40.8 % (ref 38.5–50.0)
Hemoglobin: 14 g/dL (ref 13.2–17.1)
Lymphs Abs: 3426 cells/uL (ref 850–3900)
MCH: 29.7 pg (ref 27.0–33.0)
MCHC: 34.3 g/dL (ref 32.0–36.0)
MCV: 86.6 fL (ref 80.0–100.0)
MPV: 10.3 fL (ref 7.5–12.5)
Monocytes Relative: 8.7 %
Neutro Abs: 3629 cells/uL (ref 1500–7800)
Neutrophils Relative %: 44.8 %
Platelets: 305 10*3/uL (ref 140–400)
RBC: 4.71 10*6/uL (ref 4.20–5.80)
RDW: 13.3 % (ref 11.0–15.0)
Total Lymphocyte: 42.3 %
WBC: 8.1 10*3/uL (ref 3.8–10.8)

## 2021-02-23 LAB — HEMOGLOBIN A1C
Hgb A1c MFr Bld: 5.5 % of total Hgb (ref <5.7)
Mean Plasma Glucose: 111 mg/dL
eAG (mmol/L): 6.2 mmol/L

## 2021-02-23 LAB — COMPLETE METABOLIC PANEL WITH GFR
AG Ratio: 1.9 (calc) (ref 1.0–2.5)
ALT: 38 U/L (ref 9–46)
AST: 27 U/L (ref 10–35)
Albumin: 4.9 g/dL (ref 3.6–5.1)
Alkaline phosphatase (APISO): 114 U/L (ref 35–144)
BUN: 18 mg/dL (ref 7–25)
CO2: 30 mmol/L (ref 20–32)
Calcium: 10.1 mg/dL (ref 8.6–10.3)
Chloride: 101 mmol/L (ref 98–110)
Creat: 1.14 mg/dL (ref 0.70–1.30)
Globulin: 2.6 g/dL (calc) (ref 1.9–3.7)
Glucose, Bld: 123 mg/dL — ABNORMAL HIGH (ref 65–99)
Potassium: 3.8 mmol/L (ref 3.5–5.3)
Sodium: 142 mmol/L (ref 135–146)
Total Bilirubin: 0.7 mg/dL (ref 0.2–1.2)
Total Protein: 7.5 g/dL (ref 6.1–8.1)
eGFR: 74 mL/min/{1.73_m2} (ref >=60)

## 2021-02-23 LAB — LIPID PANEL
Cholesterol: 129 mg/dL (ref <200)
HDL: 41 mg/dL (ref >=40)
LDL Cholesterol (Calc): 68 mg/dL (calc)
Non-HDL Cholesterol (Calc): 88 mg/dL (calc) (ref <130)
Total CHOL/HDL Ratio: 3.1 (calc) (ref <5.0)
Triglycerides: 114 mg/dL (ref <150)

## 2021-02-23 LAB — MAGNESIUM: Magnesium: 2 mg/dL (ref 1.5–2.5)

## 2021-02-23 LAB — TSH: TSH: 2.76 mIU/L (ref 0.40–4.50)

## 2021-04-30 ENCOUNTER — Other Ambulatory Visit: Payer: Self-pay | Admitting: Internal Medicine

## 2021-04-30 DIAGNOSIS — I10 Essential (primary) hypertension: Secondary | ICD-10-CM

## 2021-04-30 MED ORDER — AMLODIPINE BESYLATE 10 MG PO TABS: ORAL_TABLET | ORAL | 3 refills | Status: DC

## 2021-05-08 ENCOUNTER — Other Ambulatory Visit: Payer: Self-pay | Admitting: Internal Medicine

## 2021-05-08 DIAGNOSIS — I10 Essential (primary) hypertension: Secondary | ICD-10-CM

## 2021-05-08 MED ORDER — AMLODIPINE BESYLATE 10 MG PO TABS: ORAL_TABLET | ORAL | 3 refills | Status: DC

## 2021-05-25 ENCOUNTER — Other Ambulatory Visit: Payer: Self-pay | Admitting: Internal Medicine

## 2021-05-25 ENCOUNTER — Other Ambulatory Visit: Payer: Self-pay | Admitting: Adult Health

## 2021-05-25 DIAGNOSIS — E118 Type 2 diabetes mellitus with unspecified complications: Secondary | ICD-10-CM

## 2021-05-25 DIAGNOSIS — E1169 Type 2 diabetes mellitus with other specified complication: Secondary | ICD-10-CM

## 2021-05-25 MED ORDER — GLIPIZIDE 5 MG PO TABS: ORAL_TABLET | ORAL | 1 refills | Status: DC

## 2021-07-15 NOTE — Progress Notes (Signed)
Complete Physical  Assessment and Plan:  Gabriel Hoover was seen today for annual exam.  Diagnoses and all orders for this visit:  Encounter for routine history and physical examination Due annually  White coat syndrome with hypertension Not routinely taking BP at home; Increase Hyzaar back to 1 pill daily and keep BP log, call if develops fatigue Monitor blood pressure at home; call if consistently over 130/80 Continue DASH diet.   Reminder to go to the ER if any CP, SOB, nausea, dizziness, severe HA, changes vision/speech, left arm numbness and tingling and jaw pain.  History of CVA (cerebrovascular accident) Control blood pressure, cholesterol, increase exercise, ASA   T2_NIDDM (HCC) Type 2 Diabetes Mellitus - poorly controlled Education: Reviewed 'ABCs' of diabetes management (respective goals in parentheses):  A1C (<7), blood pressure (<130/80), and cholesterol (LDL <70) Eye Exam yearly and Dental Exam every 6 months.- exam report requested Foot exam completed Dietary recommendations Physical Activity recommendations Not checking sugars due to severe needle phobia - maintaining well without concerning sx of hypoglycemia; reduce glipizide if needed - given foot care handout and explained the principles  - given instructions for hypoglycemia management   Hyperlipidemia associated with type 2 diabetes mellitus ([place]) Continue medications, LDL goal <70 Continue low cholesterol diet and exercise.   CKD stage 2 due to type 2 diabetes mellitus (HCC) Increase fluids, avoid NSAIDS, monitor sugars, will monitor  Vitamin D deficiency Start Vit D3 2000 units daily supplement Will recheck at next visit  Medication management Continued  Overweight (BMI 25.0-29.9) Long discussion about weight loss, diet, and exercise Recommended diet heavy in fruits and veggies and low in animal meats, cheeses, and dairy products, appropriate calorie intake Discussed appropriate weight for height Follow  up at next visit  Severe needle phobia Benzo if needed;   Poor compliance with medication Improved; continue to monitor; emphasized risks of non-compliance; patient appears motivated, denies barriers; keep pill burden low if possible   Invasive squamous cell carcinoma Removed at skin center with Mohs 10/06/20 Continue to follow with skin checks  Screening for colon cancer - patient declines a colonoscopy even though the risks and benefits were discussed at length. Colon cancer is 3rd most diagnosed cancer and 2nd leading cause of death in both men and women 22 years of age and older. Patient understands the risk of cancer and death with declining the test however they are willing to do cologuard screening instead. They understand that this is not as sensitive or specific as a colonoscopy and they are still recommended to get a colonoscopy. The cologuard will be sent out to their house.  -     Cologuard is at home, has not completed yet will check to see if expired  Onchomycosis Tried  one round of Lamisil and is having improvement.  Refuses bloodwork today due to needle phobia.  Last labs were stable, will do at 4 months recheck      Discussed med's effects and SE's. Screening labs and tests as requested with regular follow-up as recommended. Over 40 minutes of exam, counseling, chart review and critical decision making was performed  Future Appointments  Date Time Provider [place]  07/17/2022  3:00 PM Magda Bernheim, NP [facility] None     HPI 60 y.o. patient presents for a complete physical. He has Severe needle phobia; Hyperlipidemia associated with type 2 diabetes mellitus ([place]); White coat syndrome with hypertension; T2_NIDDM ([place]); Vitamin D deficiency; Medication management; Overweight (BMI 25.0-29.9); Poor compliance with medication; Amaurosis  fugax; History of CVA (cerebrovascular accident); Hypertension; CKD stage 2 due to type 2 diabetes mellitus ([place]); Family  history of heart attack; and History of SCC (squamous cell carcinoma) of skin on their problem list.   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  2 kids, 1 grandson  Patient has hx/o Severe labile HTN predating since 0932 complicated by poor insight /comprehension and very poor compliance attributed to his anxiety re: having venipuncture.  In July 2020, patient was hospitalized with a left cerebellar CVA. Cardiac 2D echo, Carotid Aa U/S, Bubble test and TEE were negative. He was started on ASA & Plavix.  He does occasionally check BP at home and runs 130's/80's  He has been seeing ortho for cervical pain with radiculopathy, has been prescribed gabapentin, vicodin and ? Meloxicam, hasn't taken, discussed injections but needle phobia and rather be in pain.   BMI is Body mass index is 26.4 kg/m., he has been working on diet and exercise. He drinks 8+ glasses of water daily, rare soda, does admit to 5 cups of coffee daily  Wt Readings from Last 3 Encounters:  07/17/21 176 lb 3.2 oz (79.9 kg)  02/22/21 177 lb (80.3 kg)  11/17/20 181 lb (82.1 kg)   His blood pressure has been controlled at home (120/80s), today their BP is BP: (!) 146/82 Long hx of white coat hypertension. Had been taking Hyzaar 1/2 tab but BP has been creeping up slightly He does workout. He denies chest pain, shortness of breath, dizziness.   He is on cholesterol medication and denies myalgias. His cholesterol is at goal. The cholesterol last visit was:   Lab Results  Component Value Date   CHOL 129 02/22/2021   HDL 41 02/22/2021   LDLCALC 68 02/22/2021   TRIG 114 02/22/2021   CHOLHDL 3.1 02/22/2021   He has been working on diet and exercise for T2DM with CKD (A1C 10.1% in 05/2019), much improved by lifestyle and metformin (taking 1 tab daily only, has GI SE with higher dose, also taking glipizide 2.5 mg BID), he is on bASA, he is on ACE/ARB and denies increased appetite, nausea, paresthesia of the feet,  polydipsia, polyuria and visual disturbances. Last A1C in the office was:  Lab Results  Component Value Date   HGBA1C 5.5 02/22/2021   Last GFR: GFR 74 02/22/21  Patient is not on Vitamin D supplement, is not currently taking a supplement Lab Results  Component Value Date   VD25OH 30 10/13/2019     Last PSA was: Lab Results  Component Value Date   PSA 0.93 07/14/2020     Current Medications:  Current Outpatient Medications on File Prior to Visit  Medication Sig Dispense Refill   amLODipine (NORVASC) 10 MG tablet Take 1 tablet at Bedtime for BP 90 tablet 3   atorvastatin (LIPITOR) 80 MG tablet TAKE ONE TABLET BY MOUTH DAILY 90 tablet 1   clopidogrel (PLAVIX) 75 MG tablet Take 1 tablet (75 mg total) by mouth daily. 90 tablet 3   glipiZIDE (GLUCOTROL) 5 MG tablet Take  1/2 to 1 tablet  2 x /day  with Meals  for Diabetes 180 tablet 1   losartan-hydrochlorothiazide (HYZAAR) 100-25 MG tablet Take n 1 tablet  Daily  for BP 90 tablet 1   metFORMIN (GLUCOPHAGE-XR) 500 MG 24 hr tablet Take  1 to 2 tablets  2 x /day  with Meals  for Diabetes 360 tablet 1   GABAPENTIN PO Take by mouth 3 (  three) times daily. (Patient not taking: Reported on 07/17/2021)     No current facility-administered medications on file prior to visit.   Allergies:  Allergies  Allergen Reactions   Atenolol Other (See Comments)    Made tired   Health Maintenance:   There is no immunization history on file for this patient.  Declines all immunizations   Colonoscopy: - declines, willing to consider cologuard  Eye Exam: [facility], Dr.  Dentist: - 2020, sees his sister who is hygienist when in town  Patient Care Team: Unk Pinto, MD as PCP - General (Internal Medicine)  Medical History:  has Severe needle phobia; Hyperlipidemia associated with type 2 diabetes mellitus ([place]); White coat syndrome with hypertension; T2_NIDDM ([place]); Vitamin D deficiency; Medication management; Overweight (BMI 25.0-29.9);  Poor compliance with medication; Amaurosis fugax; History of CVA (cerebrovascular accident); Hypertension; CKD stage 2 due to type 2 diabetes mellitus ([place]); Family history of heart attack; and History of SCC (squamous cell carcinoma) of skin on their problem list. Surgical History:  He  has a past surgical history that includes TEE without cardioversion (N/A, 03/02/2019); Bubble study (03/02/2019); and Refractive surgery (Bilateral, 1991). Family History:  His family history includes Dementia (age of onset: 84) in his father; Heart attack in his maternal grandfather; Heart attack (age of onset: 72) in his paternal grandfather; Heart disease in his father; Hyperlipidemia in his father. Social History:   reports that he has never smoked. He has never used smokeless tobacco. He reports that he does not drink alcohol and does not use drugs.   Review of Systems:  Review of Systems  Constitutional:  Negative for malaise/fatigue and weight loss.  HENT:  Negative for hearing loss and tinnitus.   Eyes:  Negative for blurred vision and double vision.  Respiratory:  Negative for cough, shortness of breath and wheezing.   Cardiovascular:  Negative for chest pain, palpitations, orthopnea, claudication and leg swelling.  Gastrointestinal:  Negative for abdominal pain, blood in stool, constipation, diarrhea, heartburn, melena, nausea and vomiting.  Genitourinary: Negative.   Musculoskeletal:  Negative for joint pain and myalgias.  Skin:  Negative for rash.  Neurological:  Negative for dizziness, tingling, sensory change, weakness and headaches.  Endo/Heme/Allergies:  Negative for polydipsia.  Psychiatric/Behavioral: Negative.    All other systems reviewed and are negative.  Physical Exam: Estimated body mass index is 26.4 kg/m as calculated from the following:   Height as of this encounter: 5' 8.5" (1.74 m).   Weight as of this encounter: 176 lb 3.2 oz (79.9 kg). BP (!) 146/82   Pulse 66   Temp 97.7  F (36.5 C)   Ht 5' 8.5" (1.74 m)   Wt 176 lb 3.2 oz (79.9 kg)   SpO2 98%   BMI 26.40 kg/m  General Appearance: Well nourished, in no apparent distress.  Eyes: PERRLA, EOMs, conjunctiva no swelling or erythema Sinuses: No Frontal/maxillary tenderness  ENT/Mouth: Ext aud canals clear, normal light reflex with TMs without erythema, bulging. Good dentition. No erythema, swelling, or exudate on post pharynx. Tonsils not swollen or erythematous. Hearing normal.  Neck: Supple, thyroid normal. No bruits  Respiratory: Respiratory effort normal, BS equal bilaterally without rales, rhonchi, wheezing or stridor.  Cardio: RRR without murmurs, rubs or gallops. Brisk peripheral pulses without edema.  Chest: symmetric, with normal excursions and percussion.  Abdomen: Soft, nontender, no guarding, rebound, hernias, masses, or organomegaly.  Lymphatics: Non tender without lymphadenopathy.  Genitourinary: no concerns, defer Musculoskeletal: Full ROM all peripheral extremities,5/5  strength, and normal gait.  Skin: Warm, dry without rashes, ecchymosis. Bil feet onycomycosis, R 3rd digit incision healed well. Toenail fungus grown out to mid nail Neuro: Cranial nerves intact, reflexes equal bilaterally. Normal muscle tone, no cerebellar symptoms. Sensation intact bil upper and lower extremities.  Psych: Awake and oriented X 3, normal affect, Insight and Judgment appropriate.   EKG: Sinus bradycardia, no ST changes   Larell Baney W Lavana Huckeba 3:05 PM [facility]

## 2021-07-17 ENCOUNTER — Encounter: Payer: PRIVATE HEALTH INSURANCE | Admitting: Adult Health

## 2021-07-17 ENCOUNTER — Ambulatory Visit: Payer: BC Managed Care – PPO | Admitting: Nurse Practitioner

## 2021-07-17 ENCOUNTER — Encounter: Payer: Self-pay | Admitting: Nurse Practitioner

## 2021-07-17 ENCOUNTER — Other Ambulatory Visit: Payer: Self-pay

## 2021-07-17 VITALS — BP 146/82 | HR 66 | Temp 97.7°F | Ht 68.5 in | Wt 176.2 lb

## 2021-07-17 DIAGNOSIS — E663 Overweight: Secondary | ICD-10-CM

## 2021-07-17 DIAGNOSIS — E1122 Type 2 diabetes mellitus with diabetic chronic kidney disease: Secondary | ICD-10-CM

## 2021-07-17 DIAGNOSIS — B351 Tinea unguium: Secondary | ICD-10-CM

## 2021-07-17 DIAGNOSIS — Z79899 Other long term (current) drug therapy: Secondary | ICD-10-CM

## 2021-07-17 DIAGNOSIS — I10 Essential (primary) hypertension: Secondary | ICD-10-CM | POA: Diagnosis not present

## 2021-07-17 DIAGNOSIS — Z9114 Patient's other noncompliance with medication regimen: Secondary | ICD-10-CM

## 2021-07-17 DIAGNOSIS — Z1329 Encounter for screening for other suspected endocrine disorder: Secondary | ICD-10-CM

## 2021-07-17 DIAGNOSIS — Z136 Encounter for screening for cardiovascular disorders: Secondary | ICD-10-CM | POA: Diagnosis not present

## 2021-07-17 DIAGNOSIS — E559 Vitamin D deficiency, unspecified: Secondary | ICD-10-CM

## 2021-07-17 DIAGNOSIS — F40231 Fear of injections and transfusions: Secondary | ICD-10-CM

## 2021-07-17 DIAGNOSIS — Z0000 Encounter for general adult medical examination without abnormal findings: Secondary | ICD-10-CM

## 2021-07-17 DIAGNOSIS — E1169 Type 2 diabetes mellitus with other specified complication: Secondary | ICD-10-CM

## 2021-07-17 DIAGNOSIS — Z85828 Personal history of other malignant neoplasm of skin: Secondary | ICD-10-CM

## 2021-07-17 DIAGNOSIS — E119 Type 2 diabetes mellitus without complications: Secondary | ICD-10-CM

## 2021-07-17 DIAGNOSIS — N1831 Chronic kidney disease, stage 3a: Secondary | ICD-10-CM

## 2021-07-17 DIAGNOSIS — Z125 Encounter for screening for malignant neoplasm of prostate: Secondary | ICD-10-CM

## 2021-07-17 DIAGNOSIS — I639 Cerebral infarction, unspecified: Secondary | ICD-10-CM

## 2021-07-17 DIAGNOSIS — Z1389 Encounter for screening for other disorder: Secondary | ICD-10-CM

## 2021-07-17 DIAGNOSIS — Z0001 Encounter for general adult medical examination with abnormal findings: Secondary | ICD-10-CM

## 2021-07-17 NOTE — Patient Instructions (Signed)

## 2021-08-09 ENCOUNTER — Other Ambulatory Visit: Payer: Self-pay | Admitting: Nurse Practitioner

## 2021-08-09 DIAGNOSIS — Z1211 Encounter for screening for malignant neoplasm of colon: Secondary | ICD-10-CM

## 2021-10-06 DIAGNOSIS — Z1211 Encounter for screening for malignant neoplasm of colon: Secondary | ICD-10-CM | POA: Diagnosis not present

## 2021-10-13 LAB — COLOGUARD: COLOGUARD: NEGATIVE

## 2021-11-15 NOTE — Progress Notes (Signed)
3 MONTH FOLLOW UP ? ?Assessment and Plan: ? ? ?White coat syndrome with hypertension ?Well controlled per home log; continue current treatment  ?Monitor blood pressure at home; call if consistently over 130/80 ?Continue DASH diet.   ?Reminder to go to the ER if any CP, SOB, nausea, dizziness, severe HA, changes vision/speech, left arm numbness and tingling and jaw pain. ?-     CBC with Differential/Platelet ?-     COMPLETE METABOLIC PANEL WITH GFR ? ? ?History of CVA (cerebrovascular accident) ?Control blood pressure, cholesterol, increase exercise ?Stop ASA, start plavix - patient preference for fewer meds ? ?T2_NIDDM ([place]) ?Education: Reviewed ?ABCs? of diabetes management (respective goals in parentheses):  A1C (<7), blood pressure (<130/80), and cholesterol (LDL <70) ?Eye Exam yearly and Dental Exam every 6 months.- exam report requested ?Dietary recommendations ?Physical Activity recommendations ?      Stressed importance of blood sugar checks ?-     COMPLETE METABOLIC PANEL WITH GFR ?-     Hemoglobin A1c ? ?Hyperlipidemia associated with type 2 diabetes mellitus ([place]) ?Continue medications, LDL goal <70 ?Continue low cholesterol diet and exercise.  ?Check lipid panel.  ?-     Lipid panel ? ?CKD stage 2 due to type 2 diabetes mellitus ([place]) ?Increase fluids, avoid NSAIDS, monitor sugars, will monitor ?-     COMPLETE METABOLIC PANEL WITH GFR ? ?Vitamin D deficiency ?Continue supplement; defer check to CPE due to insurance ? ?Medication management ?-     CBC with Differential/Platelet ?-     COMPLETE METABOLIC PANEL WITH GFR ? ?BMI 24 ?Long discussion about weight loss, diet, and exercise ?Recommended diet heavy in fruits and veggies and low in animal meats, cheeses, and dairy products, appropriate calorie intake ?Discussed appropriate weight for height ?Follow up at next visit ? ?Severe needle phobia ?Benzo if needed; limit lab sticks to minimum necessary ? ?Poor compliance with medication ?Significantly  improved; continue to monitor; emphasized risks of non-compliance; patient appears motivated, denies barriers; keep pill burden low if possible   ? ?Onychomycosis ?Improved with lamisili, check LFTs,  ?Another refill of Lamisil is written ? ? ? ?Discussed med's effects and SE's. Screening labs and tests as requested with regular follow-up as recommended. ?Over 40 minutes of exam, counseling, chart review and critical decision making was performed ? ?Future Appointments  ?Date Time Provider [place]  ?07/17/2022  3:00 PM Cathey [place], Townsend Roger, NP [facility] None  ? ? ? ?HPI ?61 y.o. patient presents for 3 month follow up for T2DM with CKD, htn, hyperlipidemia, vit D def.  ? ? ? ?Patient has hx/o Severe labile HTN predating since 1950 complicated by poor insight /comprehension and very poor compliance attributed to his anxiety re: having venipuncture. In July 2020, patient was hospitalized with a left cerebellar CVA. Cardiac 2D echo, Carotid Aa U/S, Bubble test and TEE were negative. He was started on ASA & Plavix, he reports never started aspirin.  ?  ?He has been seeing ortho for cervical pain with radiculopathy, has been prescribed gabapentin, taking 2 at night. Pain resolved and does not take Gabapentin. ? ?He has onychomycosis, was prescribed lamisil which he started 01/11/2021,he did have results but did not continue.  Would like to restart ? ?BMI is Body mass index is 24.81 kg/m?., he has been working on diet and exercise.He drinks 8+ glasses of water daily, rare soda, has reduced coffee from 5/day to 2 per my recommendation.  ?Wt Readings from Last 3 Encounters:  ?11/20/21 165 lb  9.6 oz (75.1 kg)  ?07/17/21 176 lb 3.2 oz (79.9 kg)  ?02/22/21 177 lb (80.3 kg)  ? ?His blood pressure has been controlled at home (<130/70s), today their BP is BP: (!) 142/82  ?BP Readings from Last 3 Encounters:  ?11/20/21 (!) 142/82  ?07/17/21 (!) 146/82  ?02/22/21 140/80  ? ? ?Long hx of white coat hypertension.  ?He does workout. He  denies chest pain, shortness of breath, dizziness.  ? ?He is on cholesterol medication and denies myalgias. His cholesterol is at goal. The cholesterol last visit was:   ?Lab Results  ?Component Value Date  ? CHOL 129 02/22/2021  ? HDL 41 02/22/2021  ? [place] 68 02/22/2021  ? TRIG 114 02/22/2021  ? CHOLHDL 3.1 02/22/2021  ? ?He has been working on diet and exercise for T2DM with CKD (A1C 10.1% in 05/2019), much improved by lifestyle and metformin (taking 1 tab daily only, has GI SE with higher dose), he is on bASA, he is on ACE/ARB and denies increased appetite, nausea, paresthesia of the feet, polydipsia, polyuria and visual disturbances.  ?He admits hasn't been checking glucose, needle phobia, "I feel fine"  ?Last A1C in the office was:  ?Lab Results  ?Component Value Date  ? HGBA1C 5.5 02/22/2021  ? ?Last GFR: ?Lab Results  ?Component Value Date  ? GFRNONAA 72 07/14/2020  ? GFRNONAA 56 (L) 03/09/2020  ? GFRNONAA 82 10/13/2019  ? ?Patient is not on Vitamin D supplement,  ?Lab Results  ?Component Value Date  ? VD25OH 30 10/13/2019  ?   ? ? ?Current Medications:  ?Current Outpatient Medications on File Prior to Visit  ?Medication Sig Dispense Refill  ? amLODipine (NORVASC) 10 MG tablet Take 1 tablet at Bedtime for BP 90 tablet 3  ? atorvastatin (LIPITOR) 80 MG tablet TAKE ONE TABLET BY MOUTH DAILY 90 tablet 1  ? Cholecalciferol (VITAMIN D) 125 MCG (5000 UT) CAPS Take by mouth.    ? clopidogrel (PLAVIX) 75 MG tablet Take 1 tablet (75 mg total) by mouth daily. 90 tablet 3  ? metFORMIN (GLUCOPHAGE-XR) 500 MG 24 hr tablet Take  1 to 2 tablets  2 x /day  with Meals  for Diabetes 360 tablet 1  ? Omega-3 Fatty Acids (FISH OIL PO) Take by mouth.    ? ?No current facility-administered medications on file prior to visit.  ? ?Allergies:  ?Allergies  ?Allergen Reactions  ? Atenolol Other (See Comments)  ?  Made tired  ? ? ?Patient Care Team: ?Unk Pinto, MD as PCP - General (Internal Medicine) ? ?Medical History:  ?has  Severe needle phobia; Hyperlipidemia associated with type 2 diabetes mellitus ([place]); White coat syndrome with hypertension; T2_NIDDM ([place]); Vitamin D deficiency; Medication management; Overweight (BMI 25.0-29.9); Poor compliance with medication; Amaurosis fugax; History of CVA (cerebrovascular accident); Hypertension; CKD stage 2 due to type 2 diabetes mellitus ([place]); Family history of heart attack; and History of SCC (squamous cell carcinoma) of skin on their problem list. ?Surgical History:  ?He  has a past surgical history that includes TEE without cardioversion (N/A, 03/02/2019); Bubble study (03/02/2019); and Refractive surgery (Bilateral, 1991). ?Family History:  ?His family history includes Dementia (age of onset: 83) in his father; Heart attack in his maternal grandfather; Heart attack (age of onset: 17) in his paternal grandfather; Heart disease in his father; Hyperlipidemia in his father. ?Social History:  ? reports that he has never smoked. He has never used smokeless tobacco. He reports that he does not drink alcohol  and does not use drugs.  ? ?Review of Systems:  ?Review of Systems  ?Constitutional:  Negative for malaise/fatigue and weight loss.  ?HENT:  Negative for hearing loss and tinnitus.   ?Eyes:  Negative for blurred vision and double vision.  ?Respiratory:  Negative for cough, shortness of breath and wheezing.   ?Cardiovascular:  Negative for chest pain, palpitations, orthopnea, claudication and leg swelling.  ?Gastrointestinal:  Negative for abdominal pain, blood in stool, constipation, diarrhea, heartburn, melena, nausea and vomiting.  ?Genitourinary: Negative.   ?Musculoskeletal:  Negative for joint pain and myalgias.  ?Skin:  Negative for rash.  ?     Fungus on all toenails  ?Neurological:  Negative for dizziness, tingling, sensory change, weakness and headaches.  ?Endo/Heme/Allergies:  Negative for polydipsia.  ?Psychiatric/Behavioral: Negative.    ?All other systems reviewed and are  negative. ? ?Physical Exam: ?Estimated body mass index is 24.81 kg/m? as calculated from the following: ?  Height as of 07/17/21: 5' 8.5" (1.74 m). ?  Weight as of this encounter: 165 lb 9.6 oz (75.1 kg). ?BP (!) 142

## 2021-11-20 ENCOUNTER — Encounter: Payer: Self-pay | Admitting: Nurse Practitioner

## 2021-11-20 ENCOUNTER — Ambulatory Visit: Payer: BC Managed Care – PPO | Admitting: Nurse Practitioner

## 2021-11-20 VITALS — BP 142/82 | HR 74 | Temp 97.9°F | Wt 165.6 lb

## 2021-11-20 DIAGNOSIS — E1122 Type 2 diabetes mellitus with diabetic chronic kidney disease: Secondary | ICD-10-CM

## 2021-11-20 DIAGNOSIS — N182 Chronic kidney disease, stage 2 (mild): Secondary | ICD-10-CM

## 2021-11-20 DIAGNOSIS — F40231 Fear of injections and transfusions: Secondary | ICD-10-CM

## 2021-11-20 DIAGNOSIS — I10 Essential (primary) hypertension: Secondary | ICD-10-CM | POA: Diagnosis not present

## 2021-11-20 DIAGNOSIS — Z8673 Personal history of transient ischemic attack (TIA), and cerebral infarction without residual deficits: Secondary | ICD-10-CM

## 2021-11-20 DIAGNOSIS — E663 Overweight: Secondary | ICD-10-CM

## 2021-11-20 DIAGNOSIS — E1169 Type 2 diabetes mellitus with other specified complication: Secondary | ICD-10-CM | POA: Diagnosis not present

## 2021-11-20 DIAGNOSIS — E785 Hyperlipidemia, unspecified: Secondary | ICD-10-CM

## 2021-11-20 DIAGNOSIS — E119 Type 2 diabetes mellitus without complications: Secondary | ICD-10-CM | POA: Diagnosis not present

## 2021-11-20 DIAGNOSIS — B351 Tinea unguium: Secondary | ICD-10-CM

## 2021-11-20 DIAGNOSIS — Z79899 Other long term (current) drug therapy: Secondary | ICD-10-CM

## 2021-11-20 DIAGNOSIS — Z91148 Patient's other noncompliance with medication regimen for other reason: Secondary | ICD-10-CM

## 2021-11-20 DIAGNOSIS — Z6824 Body mass index (BMI) 24.0-24.9, adult: Secondary | ICD-10-CM

## 2021-11-20 DIAGNOSIS — E559 Vitamin D deficiency, unspecified: Secondary | ICD-10-CM

## 2021-11-20 MED ORDER — LOSARTAN POTASSIUM-HCTZ 100-25 MG PO TABS: ORAL_TABLET | ORAL | 1 refills | Status: DC

## 2021-11-20 MED ORDER — TERBINAFINE HCL 250 MG PO TABS
250.0000 mg | ORAL_TABLET | Freq: Every day | ORAL | 0 refills | Status: DC
Start: 2021-11-20 — End: 2023-01-31

## 2021-11-20 NOTE — Addendum Note (Signed)
Addended by: Magda Bernheim on: 11/20/2021 02:51 PM ? ? Modules accepted: Orders ? ?

## 2021-11-21 LAB — CBC WITH DIFFERENTIAL/PLATELET
Absolute Monocytes: 510 cells/uL (ref 200–950)
Basophils Absolute: 73 cells/uL (ref 0–200)
Basophils Relative: 0.8 %
Eosinophils Absolute: 118 cells/uL (ref 15–500)
Eosinophils Relative: 1.3 %
HCT: 41.8 % (ref 38.5–50.0)
Hemoglobin: 14.1 g/dL (ref 13.2–17.1)
Lymphs Abs: 2321 cells/uL (ref 850–3900)
MCH: 29.3 pg (ref 27.0–33.0)
MCHC: 33.7 g/dL (ref 32.0–36.0)
MCV: 86.7 fL (ref 80.0–100.0)
MPV: 11.2 fL (ref 7.5–12.5)
Monocytes Relative: 5.6 %
Neutro Abs: 6079 cells/uL (ref 1500–7800)
Neutrophils Relative %: 66.8 %
Platelets: 265 10*3/uL (ref 140–400)
RBC: 4.82 10*6/uL (ref 4.20–5.80)
RDW: 12.5 % (ref 11.0–15.0)
Total Lymphocyte: 25.5 %
WBC: 9.1 10*3/uL (ref 3.8–10.8)

## 2021-11-21 LAB — LIPID PANEL
Cholesterol: 135 mg/dL (ref <200)
HDL: 49 mg/dL (ref >=40)
LDL Cholesterol (Calc): 61 mg/dL (calc)
Non-HDL Cholesterol (Calc): 86 mg/dL (calc) (ref <130)
Total CHOL/HDL Ratio: 2.8 (calc) (ref <5.0)
Triglycerides: 174 mg/dL — ABNORMAL HIGH (ref <150)

## 2021-11-21 LAB — COMPLETE METABOLIC PANEL WITH GFR
AG Ratio: 1.8 (calc) (ref 1.0–2.5)
ALT: 30 U/L (ref 9–46)
AST: 17 U/L (ref 10–35)
Albumin: 4.6 g/dL (ref 3.6–5.1)
Alkaline phosphatase (APISO): 122 U/L (ref 35–144)
BUN: 17 mg/dL (ref 7–25)
CO2: 31 mmol/L (ref 20–32)
Calcium: 9.8 mg/dL (ref 8.6–10.3)
Chloride: 97 mmol/L — ABNORMAL LOW (ref 98–110)
Creat: 1.26 mg/dL (ref 0.70–1.35)
Globulin: 2.5 g/dL (calc) (ref 1.9–3.7)
Glucose, Bld: 384 mg/dL — ABNORMAL HIGH (ref 65–99)
Potassium: 4.1 mmol/L (ref 3.5–5.3)
Sodium: 138 mmol/L (ref 135–146)
Total Bilirubin: 1.1 mg/dL (ref 0.2–1.2)
Total Protein: 7.1 g/dL (ref 6.1–8.1)
eGFR: 65 mL/min/{1.73_m2} (ref >=60)

## 2021-11-21 LAB — HEMOGLOBIN A1C
Hgb A1c MFr Bld: 10.7 % of total Hgb — ABNORMAL HIGH (ref <5.7)
Mean Plasma Glucose: 260 mg/dL
eAG (mmol/L): 14.4 mmol/L

## 2021-11-27 ENCOUNTER — Other Ambulatory Visit: Payer: Self-pay | Admitting: Internal Medicine

## 2021-11-27 DIAGNOSIS — E118 Type 2 diabetes mellitus with unspecified complications: Secondary | ICD-10-CM

## 2022-01-13 ENCOUNTER — Other Ambulatory Visit: Payer: Self-pay | Admitting: Nurse Practitioner

## 2022-01-13 DIAGNOSIS — E1169 Type 2 diabetes mellitus with other specified complication: Secondary | ICD-10-CM

## 2022-02-17 ENCOUNTER — Other Ambulatory Visit: Payer: Self-pay | Admitting: Nurse Practitioner

## 2022-02-17 DIAGNOSIS — E118 Type 2 diabetes mellitus with unspecified complications: Secondary | ICD-10-CM

## 2022-03-20 ENCOUNTER — Ambulatory Visit: Payer: Self-pay | Admitting: Nurse Practitioner

## 2022-04-04 ENCOUNTER — Other Ambulatory Visit: Payer: Self-pay | Admitting: Nurse Practitioner

## 2022-04-04 DIAGNOSIS — Z8673 Personal history of transient ischemic attack (TIA), and cerebral infarction without residual deficits: Secondary | ICD-10-CM

## 2022-04-04 MED ORDER — CLOPIDOGREL BISULFATE 75 MG PO TABS: 75.0000 mg | ORAL_TABLET | Freq: Every day | ORAL | 3 refills | Status: DC

## 2022-04-11 ENCOUNTER — Other Ambulatory Visit: Payer: Self-pay | Admitting: Nurse Practitioner

## 2022-04-11 DIAGNOSIS — E785 Hyperlipidemia, unspecified: Secondary | ICD-10-CM

## 2022-04-12 NOTE — Progress Notes (Signed)
3 MONTH FOLLOW UP  Assessment and Plan:   White coat syndrome with hypertension Discussed DASH (Dietary Approaches to Stop Hypertension) DASH diet is lower in sodium than a typical American diet. Cut back on foods that are high in saturated fat, cholesterol, and trans fats. Eat more whole-grain foods, fish, poultry, and nuts Remain active and exercise as tolerated daily.  Monitor BP at home-Call if greater than 130/80.  Check and monitor CMP/CBC-Refuses lab work today -defers to 07/2022   History of CVA (cerebrovascular accident) Continue Plavix Control blood pressure, cholesterol, increase exercise   T2_NIDDM ([place]) Refuses updated A1c this visit - defers to f/u 07/2022 Discussed importance of keeping A1c monitored and controlled Discussed the risks of uncontrolled diabetes including amputation. Education: Reviewed 'ABCs' of diabetes management  Discussed goals to be met and/or maintained include A1C (<7) Blood pressure (<130/80) Cholesterol (LDL <70) Continue Eye Exam yearly  Continue Dental Exam Q6 mo Discussed dietary recommendations Discussed Physical Activity recommendations   Hyperlipidemia associated with type 2 diabetes mellitus ([place]) Controlled with elevated triglycerides. Discussed lifestyle modifications. Recommended diet heavy in fruits and veggies, omega 3's. Decrease consumption of animal meats, cheeses, and dairy products. Remain active and exercise as tolerated. Continue to monitor. Check and  lipids/TSH - refuses labs today - defer to 07/2022.  CKD stage 2 due to type 2 diabetes mellitus (HCC) Continue ARB, statin, plavix Discussed how what you eat and drink can aide in kidney protection. Stay well hydrated. Avoid high salt foods. Avoid NSAIDS. Keep BP and BG well controlled.   Take medications as prescribed. Remain active and exercise as tolerated daily. Maintain weight.  Continue to monitor. Check and monitor CMP/GFR/Microablumin - refuses  labs today - defer to 07/2022   Vitamin D deficiency Continue supplement  Medication management All medications discussed and reviewed in full. All questions and concerns regarding medications addressed.    Overweight (BMI 25.0-29.9) Discussed appropriate BMI Goal of losing 1 lb per month. Diet modification. Physical activity. Encouraged/praised to build confidence. Poor compliance with medication Significantly improved; continue to monitor; emphasized risks of non-compliance; patient appears motivated, denies barriers; keep pill burden low if possible    Onychomycosis Improved with lamisili, check LFTs, complete script as written Check LFTs next OV 07/2022.   Severe needle phobia Benzo if needed; limit lab sticks to minimum necessary Has been able to complete lab work last 3 visits without Benzo. Refuses lab work today but will be prepared for 07/2022   Patient reports "deal" was to have blood drawn yearly."  Although A1c was elevated 11/2021 patient reports monitoring diet and is doing very well. Will defer labs until 12/202.  Discussed med's effects and SE's. Screening labs and tests as requested with regular follow-up as recommended.  Over 20 minutes of exam, counseling, chart review and critical decision making was performed  Future Appointments  Date Time Provider [place]  07/17/2022  3:00 PM Alycia Rossetti, NP [facility] None     HPI 61 y.o. patient presents for 3 month follow up for T2DM with CKD, htn, hyperlipidemia, vit D def.   He had R toe SCC removed by skin surgery center, clean margins, no follow up needed.   Patient has hx/o Severe labile HTN predating since 7048 complicated by poor insight /comprehension and very poor compliance attributed to his anxiety re: having venipuncture. In July 2020, patient was hospitalized with a left cerebellar CVA. Cardiac 2D echo, Carotid Aa U/S, Bubble test and TEE were negative. He was started  on ASA &  Plavix, he reports never started aspirin. He continues not to take.  He has onychomycosis, was prescribed lamisil which he reports taking his last dose this  month. He did not take in August. His symptoms have improved and reports almost cleared.  He denies jaundice of eyes or skin, itching, RUQ or abdominal pain.  BMI is Body mass index is 24.27 kg/m., he has been working on diet and exercise.He has lost 3 lb since last OV. Wt Readings from Last 3 Encounters:  04/17/22 162 lb (73.5 kg)  11/20/21 165 lb 9.6 oz (75.1 kg)  07/17/21 176 lb 3.2 oz (79.9 kg)   His blood pressure has been controlled at home (<130/70s), today their BP is BP: (!) 150/98 Long hx of white coat hypertension.  He does workout. He denies chest pain, shortness of breath, dizziness.   He is on cholesterol medication and denies myalgias. His cholesterol is at goal. The cholesterol last visit was:   Lab Results  Component Value Date   CHOL 135 11/20/2021   HDL 49 11/20/2021   LDLCALC 61 11/20/2021   TRIG 174 (H) 11/20/2021   CHOLHDL 2.8 11/20/2021   He has been working on diet and exercise for T2DM with CKD (A1C 10.1% in 05/2019), much improved by lifestyle and metformin (taking 1 tab daily only, has GI SE with higher dose, but was asked to take 2 in 11/2021, from elevated A1c.  He has not been taking 2 but has been focused on lifestyle modifications.he is on ACE/ARB and denies increased appetite, nausea, paresthesia of the feet, polydipsia, polyuria and visual disturbances.  He does not check BG d/t needle phobia. Last A1C in the office was:  Lab Results  Component Value Date   HGBA1C 10.7 (H) 11/20/2021   Last GFR: Lab Results  Component Value Date   GFRNONAA 72 07/14/2020   GFRNONAA 56 (L) 03/09/2020   GFRNONAA 82 10/13/2019   Patient is not on Vitamin D supplement,  Lab Results  Component Value Date   VD25OH 30 10/13/2019       Current Medications:  Current Outpatient Medications on File Prior to Visit   Medication Sig Dispense Refill   amLODipine (NORVASC) 10 MG tablet Take 1 tablet at Bedtime for BP 90 tablet 3   atorvastatin (LIPITOR) 80 MG tablet Take 1 tablet by mouth once daily 90 tablet 0   Cholecalciferol (VITAMIN D) 125 MCG (5000 UT) CAPS Take by mouth.     clopidogrel (PLAVIX) 75 MG tablet Take 1 tablet (75 mg total) by mouth daily. 90 tablet 3   losartan-hydrochlorothiazide (HYZAAR) 100-25 MG tablet Take n 1 tablet  Daily  for BP 90 tablet 1   metFORMIN (GLUCOPHAGE-XR) 500 MG 24 hr tablet TAKE 1 TO 2 TABLETS BY MOUTH TWICE DAILY WITH MEALS FOR DIABETES 360 tablet 0   Omega-3 Fatty Acids (FISH OIL PO) Take by mouth.     terbinafine (LAMISIL) 250 MG tablet Take 1 tablet (250 mg total) by mouth daily. Take 1 pill daily for one month, off for 1 month, one a month, etc until gone 90 tablet 0   No current facility-administered medications on file prior to visit.   Allergies:  Allergies  Allergen Reactions   Atenolol Other (See Comments)    Made tired    Patient Care Team: Unk Pinto, MD as PCP - General (Internal Medicine)  Medical History:  has Severe needle phobia; Hyperlipidemia associated with type 2 diabetes mellitus ([place]); White coat  syndrome with hypertension; T2_NIDDM ([place]); Vitamin D deficiency; Medication management; Overweight (BMI 25.0-29.9); Poor compliance with medication; Amaurosis fugax; History of CVA (cerebrovascular accident); Hypertension; CKD stage 2 due to type 2 diabetes mellitus ([place]); Family history of heart attack; and History of SCC (squamous cell carcinoma) of skin on their problem list. Surgical History:  He  has a past surgical history that includes TEE without cardioversion (N/A, 03/02/2019); Bubble study (03/02/2019); and Refractive surgery (Bilateral, 1991). Family History:  His family history includes Dementia (age of onset: 50) in his father; Heart attack in his maternal grandfather; Heart attack (age of onset: 20) in his paternal grandfather;  Heart disease in his father; Hyperlipidemia in his father. Social History:   reports that he has never smoked. He has never used smokeless tobacco. He reports that he does not drink alcohol and does not use drugs.   Review of Systems:  Review of Systems  Constitutional:  Negative for malaise/fatigue and weight loss.  HENT:  Negative for hearing loss and tinnitus.   Eyes:  Negative for blurred vision and double vision.  Respiratory:  Negative for cough, shortness of breath and wheezing.   Cardiovascular:  Negative for chest pain, palpitations, orthopnea, claudication and leg swelling.  Gastrointestinal:  Negative for abdominal pain, blood in stool, constipation, diarrhea, heartburn, melena, nausea and vomiting.  Genitourinary: Negative.   Musculoskeletal:  Negative for joint pain and myalgias.  Skin:  Negative for rash.  Neurological:  Negative for dizziness, tingling, sensory change, weakness and headaches.  Endo/Heme/Allergies:  Negative for polydipsia.  Psychiatric/Behavioral: Negative.    All other systems reviewed and are negative.   Physical Exam: Estimated body mass index is 24.27 kg/m as calculated from the following:   Height as of this encounter: 5' 8.5" (1.74 m).   Weight as of this encounter: 162 lb (73.5 kg). BP (!) 150/98   Pulse 92   Temp (!) 97.5 F (36.4 C)   Ht 5' 8.5" (1.74 m)   Wt 162 lb (73.5 kg)   SpO2 97%   BMI 24.27 kg/m  General Appearance: Well nourished, in no apparent distress.  Eyes: PERRLA, EOMs, conjunctiva no swelling or erythema Sinuses: No Frontal/maxillary tenderness  ENT/Mouth: Ext aud canals clear, normal light reflex with TMs without erythema, bulging. Good dentition. No erythema, swelling, or exudate on post pharynx. Tonsils not swollen or erythematous. Hearing normal.  Neck: Supple, thyroid normal. No bruits  Respiratory: Respiratory effort normal, BS equal bilaterally without rales, rhonchi, wheezing or stridor.  Cardio: RRR without  murmurs, rubs or gallops. Brisk peripheral pulses without edema.  Abdomen: Soft, nontender, no guarding, rebound, hernias, masses, or organomegaly.  Lymphatics: Non tender without lymphadenopathy.  Musculoskeletal: Full ROM all peripheral extremities,5/5 strength, and normal gait.  Skin: Warm, dry without rashes, ecchymosis. Bil feet onychomycosis. Well healed R 3rd digit lateral incision site.  Neuro: Cranial nerves intact, reflexes equal bilaterally. Normal muscle tone, no cerebellar symptoms. Sensation intact bil upper and lower extremities.  Psych: Awake and oriented X 3, normal affect, Insight and Judgment appropriate.    TONYA CRANFORD 10:42 AM [facility]

## 2022-04-17 ENCOUNTER — Ambulatory Visit: Payer: BC Managed Care – PPO | Admitting: Nurse Practitioner

## 2022-04-17 ENCOUNTER — Encounter: Payer: Self-pay | Admitting: Nurse Practitioner

## 2022-04-17 VITALS — BP 150/98 | HR 92 | Temp 97.5°F | Ht 68.5 in | Wt 162.0 lb

## 2022-04-17 DIAGNOSIS — E1169 Type 2 diabetes mellitus with other specified complication: Secondary | ICD-10-CM | POA: Diagnosis not present

## 2022-04-17 DIAGNOSIS — E119 Type 2 diabetes mellitus without complications: Secondary | ICD-10-CM | POA: Diagnosis not present

## 2022-04-17 DIAGNOSIS — I10 Essential (primary) hypertension: Secondary | ICD-10-CM

## 2022-04-17 DIAGNOSIS — Z8673 Personal history of transient ischemic attack (TIA), and cerebral infarction without residual deficits: Secondary | ICD-10-CM

## 2022-04-17 DIAGNOSIS — F40231 Fear of injections and transfusions: Secondary | ICD-10-CM

## 2022-04-17 DIAGNOSIS — E559 Vitamin D deficiency, unspecified: Secondary | ICD-10-CM

## 2022-04-17 DIAGNOSIS — B351 Tinea unguium: Secondary | ICD-10-CM

## 2022-04-17 DIAGNOSIS — N182 Chronic kidney disease, stage 2 (mild): Secondary | ICD-10-CM

## 2022-04-17 DIAGNOSIS — E1122 Type 2 diabetes mellitus with diabetic chronic kidney disease: Secondary | ICD-10-CM

## 2022-04-17 DIAGNOSIS — Z6824 Body mass index (BMI) 24.0-24.9, adult: Secondary | ICD-10-CM

## 2022-04-17 DIAGNOSIS — E785 Hyperlipidemia, unspecified: Secondary | ICD-10-CM

## 2022-04-17 DIAGNOSIS — Z79899 Other long term (current) drug therapy: Secondary | ICD-10-CM

## 2022-04-17 NOTE — Patient Instructions (Signed)

## 2022-06-18 ENCOUNTER — Other Ambulatory Visit: Payer: Self-pay | Admitting: Nurse Practitioner

## 2022-06-18 DIAGNOSIS — I10 Essential (primary) hypertension: Secondary | ICD-10-CM

## 2022-07-08 ENCOUNTER — Other Ambulatory Visit: Payer: Self-pay | Admitting: Nurse Practitioner

## 2022-07-08 DIAGNOSIS — E1169 Type 2 diabetes mellitus with other specified complication: Secondary | ICD-10-CM

## 2022-07-16 ENCOUNTER — Other Ambulatory Visit: Payer: Self-pay | Admitting: Internal Medicine

## 2022-07-16 DIAGNOSIS — I10 Essential (primary) hypertension: Secondary | ICD-10-CM

## 2022-07-16 NOTE — Progress Notes (Unsigned)
 Complete Physical  Assessment and Plan:  Gabriel Hoover was seen today for annual exam.  Diagnoses and all orders for this visit:  Encounter for routine history and physical examination Due annually  White coat syndrome with hypertension Not routinely taking BP at home; Increase Hyzaar back to 1 pill daily and keep BP log, call if develops fatigue Monitor blood pressure at home; call if consistently over 130/80 Continue DASH diet.   Reminder to go to the ER if any CP, SOB, nausea, dizziness, severe HA, changes vision/speech, left arm numbness and tingling and jaw pain.  History of CVA (cerebrovascular accident) Control blood pressure, cholesterol, increase exercise, ASA   T2_NIDDM (HCC) Type 2 Diabetes Mellitus - poorly controlled Education: Reviewed 'ABCs' of diabetes management (respective goals in parentheses):  A1C (<7), blood pressure (<130/80), and cholesterol (LDL <70) Eye Exam yearly and Dental Exam every 6 months.- exam report requested Foot exam completed Dietary recommendations Physical Activity recommendations Not checking sugars due to severe needle phobia - maintaining well without concerning sx of hypoglycemia; reduce glipizide if needed If A1c remains elevated above 9 will start back on glipizide- refuses all injectables - given foot care handout and explained the principles  - given instructions for hypoglycemia management - A1c  Hyperlipidemia associated with type 2 diabetes mellitus ([place]) Continue medications, LDL goal <70 Continue low cholesterol diet and exercise.   CKD stage 2 due to type 2 diabetes mellitus (HCC) Increase fluids, avoid NSAIDS, monitor sugars, will monitor  Vitamin D deficiency Start Vit D3 2000 units daily supplement Will recheck at next visit  Medication management Continued  Overweight (BMI 25.0-29.9) Long discussion about weight loss, diet, and exercise Recommended diet heavy in fruits and veggies and low in animal meats, cheeses, and  dairy products, appropriate calorie intake Discussed appropriate weight for height Follow up at next visit  Severe needle phobia Benzo if needed;   Poor compliance with medication Improved; continue to monitor; emphasized risks of non-compliance; patient appears motivated, denies barriers; keep pill burden low if possible   Invasive squamous cell carcinoma Removed at skin center with Mohs 10/06/20 Continue to follow with skin checks  Screening for colon cancer - patient declines a colonoscopy even though the risks and benefits were discussed at length. Colon cancer is 3rd most diagnosed cancer and 2nd leading cause of death in both men and women 43 years of age and older. Patient understands the risk of cancer and death with declining the test however they are willing to do cologuard screening instead. They understand that this is not as sensitive or specific as a colonoscopy and they are still recommended to get a colonoscopy. The cologuard will be sent out to their house.  -     Cologuard is at home, has not completed yet will check to see if expired   Medication management -     CBC with Differential/Platelet -     COMPLETE METABOLIC PANEL WITH GFR -     Lipid panel -     Hemoglobin A1c -     TSH -     Magnesium -     VITAMIN D 25 Hydroxy (Vit-D Deficiency, Fractures) -     EKG 12-Lead -     [place], RETROPERITNL ABD,  LTD -     Urinalysis, Routine w reflex microscopic -     Microalbumin / creatinine urine ratio -     PSA   Screening for ischemic heart disease -     EKG 12-Lead  Screening for hematuria or proteinuria -     Urinalysis, Routine w reflex microscopic -     Microalbumin / creatinine urine ratio  Screening for thyroid disorder -     TSH  Screening for prostate cancer -     PSA  Screening for AAA (abdominal aortic aneurysm) -     [place], RETROPERITNL ABD,  LTD        Discussed med's effects and SE's. Screening labs and tests as requested with regular  follow-up as recommended. Over 40 minutes of exam, counseling, chart review and critical decision making was performed  Future Appointments  Date Time Provider [place]  07/17/2022  3:00 PM Alycia Rossetti, NP [facility] None  07/18/2023  3:00 PM Alycia Rossetti, NP [facility] None     HPI 61 y.o. patient presents for a complete physical. He has Severe needle phobia; Hyperlipidemia associated with type 2 diabetes mellitus ([place]); White coat syndrome with hypertension; T2_NIDDM ([place]); Vitamin D deficiency; Medication management; Overweight (BMI 25.0-29.9); Poor compliance with medication; Amaurosis fugax; History of CVA (cerebrovascular accident); Hypertension; CKD stage 2 due to type 2 diabetes mellitus ([place]); Family history of heart attack; and History of SCC (squamous cell carcinoma) of skin on their problem list.   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  2 kids, 1 grandson  Patient has hx/o Severe labile HTN predating since 5573 complicated by poor insight /comprehension and very poor compliance attributed to his anxiety re: having venipuncture.  In July 2020, patient was hospitalized with a left cerebellar CVA. Cardiac 2D echo, Carotid Aa U/S, Bubble test and TEE were negative. He was started on ASA 81 and Plavix, still taking plavix He does occasionally check BP at home and runs 130's/80's   BMI is Body mass index is 24.93 kg/m., he has been working on diet and exercise. He drinks 8+ glasses of water daily, rare soda, does admit to 5 cups of coffee daily  Wt Readings from Last 3 Encounters:  07/17/22 166 lb 6.4 oz (75.5 kg)  04/17/22 162 lb (73.5 kg)  11/20/21 165 lb 9.6 oz (75.1 kg)   His blood pressure has been controlled at home (120/80s), today their BP is BP: (!) 160/90  BP Readings from Last 3 Encounters:  07/17/22 (!) 160/90  04/17/22 (!) 150/98  11/20/21 (!) 142/82   Long hx of white coat hypertension. Had been taking Hyzaar 100/3m  daily and Amlodipine 10 mg QD. He is having to urinate all the time and would like to stop the HCTZ. He does workout. He denies chest pain, shortness of breath, dizziness.   He is on cholesterol medication, atorvastatin 80 mg QD and denies myalgias. His cholesterol is at goal. The cholesterol last visit was:   Lab Results  Component Value Date   CHOL 135 11/20/2021   HDL 49 11/20/2021   LDLCALC 61 11/20/2021   TRIG 174 (H) 11/20/2021   CHOLHDL 2.8 11/20/2021   He has been working on diet and exercise for T2DM with CKD (A1C 10.1% in 05/2019), much improved by lifestyle and metformin (taking 1 tab daily only, has GI SE with higher dose), he is on bASA, he is on ACE/ARB and denies increased appetite, nausea, paresthesia of the feet, polydipsia, polyuria and visual disturbances. Does not check his blood sugars because he hates needles.  Last A1C in the office was:  Lab Results  Component Value Date   HGBA1C 10.7 (H) 11/20/2021   Drinking lots of water  Last GFR: Lab Results  Component Value Date   EGFR 65 11/20/2021     Patient is not on Vitamin D supplement, is not currently taking a supplement Lab Results  Component Value Date   VD25OH 30 10/13/2019     Last PSA was: Lab Results  Component Value Date   PSA 0.93 07/14/2020     Current Medications:  Current Outpatient Medications on File Prior to Visit  Medication Sig Dispense Refill   amLODipine (NORVASC) 10 MG tablet TAKE 1 TABLET BY MOUTH NIGHTLY AT BEDTIME FOR BLOOD PRESSURE 90 tablet 0   atorvastatin (LIPITOR) 80 MG tablet Take 1 tablet by mouth once daily 90 tablet 0   Cholecalciferol (VITAMIN D) 125 MCG (5000 UT) CAPS Take by mouth.     clopidogrel (PLAVIX) 75 MG tablet Take 1 tablet (75 mg total) by mouth daily. 90 tablet 3   losartan-hydrochlorothiazide (HYZAAR) 100-25 MG tablet Take 1 tablet by mouth once daily for blood pressure 90 tablet 0   metFORMIN (GLUCOPHAGE-XR) 500 MG 24 hr tablet TAKE 1 TO 2 TABLETS BY MOUTH  TWICE DAILY WITH MEALS FOR DIABETES 360 tablet 0   Omega-3 Fatty Acids (FISH OIL PO) Take by mouth.     terbinafine (LAMISIL) 250 MG tablet Take 1 tablet (250 mg total) by mouth daily. Take 1 pill daily for one month, off for 1 month, one a month, etc until gone 90 tablet 0   No current facility-administered medications on file prior to visit.   Allergies:  Allergies  Allergen Reactions   Atenolol Other (See Comments)    Made tired   Health Maintenance:   There is no immunization history on file for this patient.  Declines all immunizations   Colonoscopy: - declines, willing to consider cologuard  Eye Exam: [facility], Dr.  Dentist: - 2020, sees his sister who is hygienist when in town  Patient Care Team: Unk Pinto, MD as PCP - General (Internal Medicine)  Medical History:  has Severe needle phobia; Hyperlipidemia associated with type 2 diabetes mellitus ([place]); White coat syndrome with hypertension; T2_NIDDM ([place]); Vitamin D deficiency; Medication management; Overweight (BMI 25.0-29.9); Poor compliance with medication; Amaurosis fugax; History of CVA (cerebrovascular accident); Hypertension; CKD stage 2 due to type 2 diabetes mellitus ([place]); Family history of heart attack; and History of SCC (squamous cell carcinoma) of skin on their problem list. Surgical History:  He  has a past surgical history that includes TEE without cardioversion (N/A, 03/02/2019); Bubble study (03/02/2019); and Refractive surgery (Bilateral, 1991). Family History:  His family history includes Dementia (age of onset: 32) in his father; Heart attack in his maternal grandfather; Heart attack (age of onset: 65) in his paternal grandfather; Heart disease in his father; Hyperlipidemia in his father. Social History:   reports that he has never smoked. He has never used smokeless tobacco. He reports that he does not drink alcohol and does not use drugs.   Review of Systems:  Review of Systems   Constitutional:  Negative for malaise/fatigue and weight loss.  HENT:  Negative for hearing loss and tinnitus.   Eyes:  Negative for blurred vision and double vision.  Respiratory:  Negative for cough, shortness of breath and wheezing.   Cardiovascular:  Negative for chest pain, palpitations, orthopnea, claudication and leg swelling.  Gastrointestinal:  Negative for abdominal pain, blood in stool, constipation, diarrhea, heartburn, melena, nausea and vomiting.  Genitourinary: Negative.   Musculoskeletal:  Negative for joint pain and myalgias.  Skin:  Negative  for rash.  Neurological:  Negative for dizziness, tingling, sensory change, weakness and headaches.  Endo/Heme/Allergies:  Negative for polydipsia.  Psychiatric/Behavioral: Negative.    All other systems reviewed and are negative.   Physical Exam: Estimated body mass index is 24.93 kg/m as calculated from the following:   Height as of this encounter: 5' 8.5" (1.74 m).   Weight as of this encounter: 166 lb 6.4 oz (75.5 kg). BP (!) 160/90   Pulse 75   Temp 98.1 F (36.7 C)   Resp 16   Ht 5' 8.5" (1.74 m)   Wt 166 lb 6.4 oz (75.5 kg)   SpO2 98%   BMI 24.93 kg/m  General Appearance: Well nourished, in no apparent distress.  Eyes: PERRLA, EOMs, conjunctiva no swelling or erythema Sinuses: No Frontal/maxillary tenderness  ENT/Mouth: Ext aud canals clear, normal light reflex with TMs without erythema, bulging. Good dentition. No erythema, swelling, or exudate on post pharynx. Tonsils not swollen or erythematous. Hearing normal.  Neck: Supple, thyroid normal. No bruits  Respiratory: Respiratory effort normal, BS equal bilaterally without rales, rhonchi, wheezing or stridor.  Cardio: RRR without murmurs, rubs or gallops. Brisk peripheral pulses without edema.  Chest: symmetric, with normal excursions and percussion.  Abdomen: Soft, nontender, no guarding, rebound, hernias, masses, or organomegaly.  Lymphatics: Non tender without  lymphadenopathy.  Genitourinary: no concerns, defer Musculoskeletal: Full ROM all peripheral extremities,5/5 strength, and normal gait.  Skin: Warm, dry without rashes, ecchymosis. Bil feet onycomycosis, R 3rd digit incision healed well. Toenail fungus grown out to mid nail Neuro: Cranial nerves intact, reflexes equal bilaterally. Normal muscle tone, no cerebellar symptoms. Sensation intact bil upper and lower extremities.  Psych: Awake and oriented X 3, normal affect, Insight and Judgment appropriate.   EKG: Sinus bradycardia, no ST changes   Fabian Coca E  2:48 PM [facility]

## 2022-07-17 ENCOUNTER — Encounter: Payer: Self-pay | Admitting: Nurse Practitioner

## 2022-07-17 ENCOUNTER — Ambulatory Visit (INDEPENDENT_AMBULATORY_CARE_PROVIDER_SITE_OTHER): Payer: Self-pay | Admitting: Nurse Practitioner

## 2022-07-17 VITALS — BP 160/90 | HR 75 | Temp 98.1°F | Resp 16 | Ht 68.5 in | Wt 166.4 lb

## 2022-07-17 DIAGNOSIS — Z6824 Body mass index (BMI) 24.0-24.9, adult: Secondary | ICD-10-CM

## 2022-07-17 DIAGNOSIS — Z1389 Encounter for screening for other disorder: Secondary | ICD-10-CM

## 2022-07-17 DIAGNOSIS — Z91148 Patient's other noncompliance with medication regimen for other reason: Secondary | ICD-10-CM

## 2022-07-17 DIAGNOSIS — Z1329 Encounter for screening for other suspected endocrine disorder: Secondary | ICD-10-CM

## 2022-07-17 DIAGNOSIS — Z0000 Encounter for general adult medical examination without abnormal findings: Secondary | ICD-10-CM

## 2022-07-17 DIAGNOSIS — Z85828 Personal history of other malignant neoplasm of skin: Secondary | ICD-10-CM

## 2022-07-17 DIAGNOSIS — Z125 Encounter for screening for malignant neoplasm of prostate: Secondary | ICD-10-CM

## 2022-07-17 DIAGNOSIS — Z79899 Other long term (current) drug therapy: Secondary | ICD-10-CM

## 2022-07-17 DIAGNOSIS — F40231 Fear of injections and transfusions: Secondary | ICD-10-CM

## 2022-07-17 DIAGNOSIS — E119 Type 2 diabetes mellitus without complications: Secondary | ICD-10-CM

## 2022-07-17 DIAGNOSIS — R350 Frequency of micturition: Secondary | ICD-10-CM

## 2022-07-17 DIAGNOSIS — Z136 Encounter for screening for cardiovascular disorders: Secondary | ICD-10-CM

## 2022-07-17 DIAGNOSIS — Z0001 Encounter for general adult medical examination with abnormal findings: Secondary | ICD-10-CM

## 2022-07-17 DIAGNOSIS — E1122 Type 2 diabetes mellitus with diabetic chronic kidney disease: Secondary | ICD-10-CM

## 2022-07-17 DIAGNOSIS — I10 Essential (primary) hypertension: Secondary | ICD-10-CM

## 2022-07-17 DIAGNOSIS — Z8673 Personal history of transient ischemic attack (TIA), and cerebral infarction without residual deficits: Secondary | ICD-10-CM

## 2022-07-17 DIAGNOSIS — E663 Overweight: Secondary | ICD-10-CM

## 2022-07-17 DIAGNOSIS — N401 Enlarged prostate with lower urinary tract symptoms: Secondary | ICD-10-CM

## 2022-07-17 DIAGNOSIS — I700 Atherosclerosis of aorta: Secondary | ICD-10-CM

## 2022-07-17 DIAGNOSIS — Z131 Encounter for screening for diabetes mellitus: Secondary | ICD-10-CM

## 2022-07-17 DIAGNOSIS — E559 Vitamin D deficiency, unspecified: Secondary | ICD-10-CM

## 2022-07-17 DIAGNOSIS — Z13220 Encounter for screening for lipoid disorders: Secondary | ICD-10-CM

## 2022-07-17 DIAGNOSIS — E785 Hyperlipidemia, unspecified: Secondary | ICD-10-CM

## 2022-07-17 NOTE — Patient Instructions (Signed)

## 2022-07-18 LAB — CBC WITH DIFFERENTIAL/PLATELET
Absolute Monocytes: 715 cells/uL (ref 200–950)
Basophils Absolute: 104 cells/uL (ref 0–200)
Basophils Relative: 0.8 %
Eosinophils Absolute: 195 cells/uL (ref 15–500)
Eosinophils Relative: 1.5 %
HCT: 41.1 % (ref 38.5–50.0)
Hemoglobin: 14.3 g/dL (ref 13.2–17.1)
Lymphs Abs: 2613 cells/uL (ref 850–3900)
MCH: 30.2 pg (ref 27.0–33.0)
MCHC: 34.8 g/dL (ref 32.0–36.0)
MCV: 86.9 fL (ref 80.0–100.0)
MPV: 11 fL (ref 7.5–12.5)
Monocytes Relative: 5.5 %
Neutro Abs: 9373 cells/uL — ABNORMAL HIGH (ref 1500–7800)
Neutrophils Relative %: 72.1 %
Platelets: 343 10*3/uL (ref 140–400)
RBC: 4.73 10*6/uL (ref 4.20–5.80)
RDW: 12.4 % (ref 11.0–15.0)
Total Lymphocyte: 20.1 %
WBC: 13 10*3/uL — ABNORMAL HIGH (ref 3.8–10.8)

## 2022-07-18 LAB — URINALYSIS, ROUTINE W REFLEX MICROSCOPIC
Bilirubin Urine: NEGATIVE
Hgb urine dipstick: NEGATIVE
Ketones, ur: NEGATIVE
Leukocytes,Ua: NEGATIVE
Nitrite: NEGATIVE
Protein, ur: NEGATIVE
Specific Gravity, Urine: 1.039 — ABNORMAL HIGH (ref 1.001–1.035)
pH: 5.5 (ref 5.0–8.0)

## 2022-07-18 LAB — MICROALBUMIN / CREATININE URINE RATIO
Creatinine, Urine: 69 mg/dL (ref 20–320)
Microalb Creat Ratio: 17 mcg/mg creat (ref <30)
Microalb, Ur: 1.2 mg/dL

## 2022-07-18 LAB — COMPLETE METABOLIC PANEL WITH GFR
AG Ratio: 1.7 (calc) (ref 1.0–2.5)
ALT: 25 U/L (ref 9–46)
AST: 16 U/L (ref 10–35)
Albumin: 4.3 g/dL (ref 3.6–5.1)
Alkaline phosphatase (APISO): 143 U/L (ref 35–144)
BUN: 17 mg/dL (ref 7–25)
CO2: 30 mmol/L (ref 20–32)
Calcium: 10 mg/dL (ref 8.6–10.3)
Chloride: 99 mmol/L (ref 98–110)
Creat: 1.19 mg/dL (ref 0.70–1.35)
Globulin: 2.5 g/dL (calc) (ref 1.9–3.7)
Glucose, Bld: 404 mg/dL — ABNORMAL HIGH (ref 65–99)
Potassium: 4.3 mmol/L (ref 3.5–5.3)
Sodium: 138 mmol/L (ref 135–146)
Total Bilirubin: 0.8 mg/dL (ref 0.2–1.2)
Total Protein: 6.8 g/dL (ref 6.1–8.1)
eGFR: 69 mL/min/{1.73_m2} (ref >=60)

## 2022-07-18 LAB — VITAMIN D 25 HYDROXY (VIT D DEFICIENCY, FRACTURES): Vit D, 25-Hydroxy: 40 ng/mL (ref 30–100)

## 2022-07-18 LAB — HEMOGLOBIN A1C
Hgb A1c MFr Bld: 11.6 % of total Hgb — ABNORMAL HIGH (ref <5.7)
Mean Plasma Glucose: 286 mg/dL
eAG (mmol/L): 15.9 mmol/L

## 2022-07-18 LAB — LIPID PANEL
Cholesterol: 137 mg/dL (ref <200)
HDL: 43 mg/dL (ref >=40)
LDL Cholesterol (Calc): 65 mg/dL (calc)
Non-HDL Cholesterol (Calc): 94 mg/dL (calc) (ref <130)
Total CHOL/HDL Ratio: 3.2 (calc) (ref <5.0)
Triglycerides: 230 mg/dL — ABNORMAL HIGH (ref <150)

## 2022-07-18 LAB — MAGNESIUM: Magnesium: 2 mg/dL (ref 1.5–2.5)

## 2022-07-18 LAB — TSH: TSH: 1.99 mIU/L (ref 0.40–4.50)

## 2022-07-18 LAB — PSA: PSA: 1.32 ng/mL (ref <=4.00)

## 2022-07-19 ENCOUNTER — Other Ambulatory Visit: Payer: Self-pay | Admitting: Nurse Practitioner

## 2022-07-19 DIAGNOSIS — E119 Type 2 diabetes mellitus without complications: Secondary | ICD-10-CM

## 2022-07-19 MED ORDER — GLIPIZIDE 5 MG PO TABS: 5.0000 mg | ORAL_TABLET | Freq: Two times a day (BID) | ORAL | 11 refills | Status: AC

## 2022-09-14 ENCOUNTER — Other Ambulatory Visit: Payer: Self-pay | Admitting: Nurse Practitioner

## 2022-09-14 DIAGNOSIS — I10 Essential (primary) hypertension: Secondary | ICD-10-CM

## 2022-09-24 ENCOUNTER — Other Ambulatory Visit: Payer: Self-pay | Admitting: Nurse Practitioner

## 2022-09-24 DIAGNOSIS — E1169 Type 2 diabetes mellitus with other specified complication: Secondary | ICD-10-CM

## 2022-10-29 ENCOUNTER — Other Ambulatory Visit: Payer: Self-pay | Admitting: Nurse Practitioner

## 2022-10-29 DIAGNOSIS — I10 Essential (primary) hypertension: Secondary | ICD-10-CM

## 2023-01-08 ENCOUNTER — Other Ambulatory Visit: Payer: Self-pay | Admitting: Nurse Practitioner

## 2023-01-08 DIAGNOSIS — I10 Essential (primary) hypertension: Secondary | ICD-10-CM

## 2023-01-21 ENCOUNTER — Other Ambulatory Visit: Payer: Self-pay | Admitting: Nurse Practitioner

## 2023-01-21 DIAGNOSIS — I10 Essential (primary) hypertension: Secondary | ICD-10-CM

## 2023-01-30 NOTE — Progress Notes (Unsigned)
 62 MONTH FOLLOW UP  Assessment and Plan:   White coat syndrome with hypertension Discussed DASH (Dietary Approaches to Stop Hypertension) DASH diet is lower in sodium than a typical American diet. Cut back on foods that are high in saturated fat, cholesterol, and trans fats. Eat more whole-grain foods, fish, poultry, and nuts Remain active and exercise as tolerated daily.  Monitor BP at home-Call if greater than 130/80.  Check and monitor CMP/CBC-Refuses lab work today -defers to 07/2022   History of CVA (cerebrovascular accident) Continue Plavix Control blood pressure, cholesterol, increase exercise   T2_NIDDM (HCC) Refuses updated A1c this visit - defers to f/u 07/2022 Discussed importance of keeping A1c monitored and controlled Discussed the risks of uncontrolled diabetes including amputation. Education: Reviewed 'ABCs' of diabetes management  Discussed goals to be met and/or maintained include A1C (<7) Blood pressure (<130/80) Cholesterol (LDL <70) Continue Eye Exam yearly  Continue Dental Exam Q6 mo Discussed dietary recommendations Discussed Physical Activity recommendations   Hyperlipidemia associated with type 2 diabetes mellitus (HCC) Controlled with elevated triglycerides. Discussed lifestyle modifications. Recommended diet heavy in fruits and veggies, omega 3's. Decrease consumption of animal meats, cheeses, and dairy products. Remain active and exercise as tolerated. Continue to monitor. Check and  lipids/TSH - refuses labs today - defer to 07/2022.  CKD stage 2 due to type 2 diabetes mellitus (HCC) Continue ARB, statin, plavix Discussed how what you eat and drink can aide in kidney protection. Stay well hydrated. Avoid high salt foods. Avoid NSAIDS. Keep BP and BG well controlled.   Take medications as prescribed. Remain active and exercise as tolerated daily. Maintain weight.  Continue to monitor. Check and monitor CMP/GFR/Microablumin - refuses  labs today - defer to 07/2022   Vitamin D deficiency Continue supplement  Medication management All medications discussed and reviewed in full. All questions and concerns regarding medications addressed.    Overweight (BMI 25.0-29.9) Discussed appropriate BMI Goal of losing 1 lb per month. Diet modification. Physical activity. Encouraged/praised to build confidence.  Poor compliance with medication Significantly improved; continue to monitor; emphasized risks of non-compliance; patient appears motivated, denies barriers; keep pill burden low if possible    Onychomycosis Improved with lamisili, check LFTs, complete script as written Check LFTs next OV 07/2022.   Severe needle phobia Benzo if needed; limit lab sticks to minimum necessary Has been able to complete lab work last 3 visits without Benzo.    Patient reports "deal" was to have blood drawn yearly."  Although A1c was elevated 11/2021 patient reports monitoring diet and is doing very well. Will defer labs until 12/202.  Discussed med's effects and SE's. Screening labs and tests as requested with regular follow-up as recommended.  Over 20 minutes of exam, counseling, chart review and critical decision making was performed  Future Appointments  Date Time Provider Department Center  01/31/2023 11:00 AM Raynelle Dick, NP [facility] None  07/18/2023  3:00 PM Raynelle Dick, NP [facility] None     HPI 62 y.o. patient presents for 3 month follow up for T2DM with CKD, htn, hyperlipidemia, vit D def.   He had R toe SCC removed by skin surgery center, clean margins, no follow up needed.   Patient has hx/o Severe labile HTN predating since 2008 complicated by poor insight /comprehension and very poor compliance attributed to his anxiety re: having venipuncture. In July 2020, patient was hospitalized with a left cerebellar CVA. Cardiac 2D echo, Carotid Aa U/S, Bubble test and TEE were negative. He  was started on ASA & 62  Plavix, he reports never started aspirin. He continues not to take.  He has onychomycosis, was prescribed lamisil which he reports taking his last dose this  month. He did not take in August. His symptoms have improved and reports almost cleared.  He denies jaundice of eyes or skin, itching, RUQ or abdominal pain.  BMI is There is no height or weight on file to calculate BMI., he has been working on diet and exercise.He has lost 3 lb since last OV. Wt Readings from Last 3 Encounters:  07/17/22 166 lb 6.4 oz (75.5 kg)  04/17/22 162 lb (73.5 kg)  11/20/21 165 lb 9.6 oz (75.1 kg)   His blood pressure has been controlled at home (<130/70s), today their BP is   Long hx of white coat hypertension.  He does workout. He denies chest pain, shortness of breath, dizziness.   He is on cholesterol medication and denies myalgias. His cholesterol is at goal. The cholesterol last visit was:   Lab Results  Component Value Date   CHOL 137 07/17/2022   HDL 43 07/17/2022   LDLCALC 65 07/17/2022   TRIG 230 (H) 07/17/2022   CHOLHDL 3.2 07/17/2022   He has been working on diet and exercise for T2DM with CKD (A1C 10.1% in 05/2019), much improved by lifestyle and metformin (taking 1 tab daily only, has GI SE with higher dose, but was asked to take 2 in 11/2021, from elevated A1c.  He has not been taking 2 but has been focused on lifestyle modifications.he is on ACE/ARB and denies increased appetite, nausea, paresthesia of the feet, polydipsia, polyuria and visual disturbances.  He does not check BG d/t needle phobia. Last A1C in the office was:  Lab Results  Component Value Date   HGBA1C 11.6 (H) 07/17/2022   Last GFR: Lab Results  Component Value Date   GFRNONAA 72 07/14/2020   GFRNONAA 56 (L) 03/09/2020   GFRNONAA 82 10/13/2019   Patient is not on Vitamin D supplement,  Lab Results  Component Value Date   VD25OH 40 07/17/2022       Current Medications:  Current Outpatient Medications on File  Prior to Visit  Medication Sig Dispense Refill   amLODipine (NORVASC) 10 MG tablet TAKE 1 TABLET BY MOUTH NIGHTLY AT BEDTIME FOR BLOOD PRESSURE 90 tablet 0   atorvastatin (LIPITOR) 80 MG tablet Take 1 tablet by mouth once daily 90 tablet 0   Cholecalciferol (VITAMIN D) 125 MCG (5000 UT) CAPS Take by mouth.     clopidogrel (PLAVIX) 75 MG tablet Take 1 tablet (75 mg total) by mouth daily. 90 tablet 3   glipiZIDE (GLUCOTROL) 5 MG tablet Take 1 tablet (5 mg total) by mouth 2 (two) times daily. 60 tablet 11   losartan-hydrochlorothiazide (HYZAAR) 100-25 MG tablet Take  1 tablet  Daily for BP                               /                                                                   TAKE  BY                                                 MOUTH 90 tablet 0   metFORMIN (GLUCOPHAGE-XR) 500 MG 24 hr tablet TAKE 1 TO 2 TABLETS BY MOUTH TWICE DAILY WITH MEALS FOR DIABETES 360 tablet 0   Omega-3 Fatty Acids (FISH OIL PO) Take by mouth.     terbinafine (LAMISIL) 250 MG tablet Take 1 tablet (250 mg total) by mouth daily. Take 1 pill daily for one month, off for 1 month, one a month, etc until gone 90 tablet 0   No current facility-administered medications on file prior to visit.   Allergies:  Allergies  Allergen Reactions   Atenolol Other (See Comments)    Made tired    Patient Care Team: Lucky Cowboy, MD as PCP - General (Internal Medicine)  Medical History:  has Severe needle phobia; Hyperlipidemia associated with type 2 diabetes mellitus (HCC); White coat syndrome with hypertension; T2_NIDDM (HCC); Vitamin D deficiency; Medication management; Overweight (BMI 25.0-29.9); Poor compliance with medication; Amaurosis fugax; History of CVA (cerebrovascular accident); Hypertension; CKD stage 2 due to type 2 diabetes mellitus (HCC); Family history of heart attack; and History of SCC (squamous cell carcinoma) of skin on their problem list. Surgical History:  He   has a past surgical history that includes TEE without cardioversion (N/A, 03/02/2019); Bubble study (03/02/2019); and Refractive surgery (Bilateral, 1991). Family History:  His family history includes Dementia (age of onset: 15) in his father; Heart attack in his maternal grandfather; Heart attack (age of onset: 38) in his paternal grandfather; Heart disease in his father; Hyperlipidemia in his father. Social History:   reports that he has never smoked. He has never used smokeless tobacco. He reports that he does not drink alcohol and does not use drugs.   Review of Systems:  Review of Systems  Constitutional:  Negative for malaise/fatigue and weight loss.  HENT:  Negative for hearing loss and tinnitus.   Eyes:  Negative for blurred vision and double vision.  Respiratory:  Negative for cough, shortness of breath and wheezing.   Cardiovascular:  Negative for chest pain, palpitations, orthopnea, claudication and leg swelling.  Gastrointestinal:  Negative for abdominal pain, blood in stool, constipation, diarrhea, heartburn, melena, nausea and vomiting.  Genitourinary: Negative.   Musculoskeletal:  Negative for joint pain and myalgias.  Skin:  Negative for rash.  Neurological:  Negative for dizziness, tingling, sensory change, weakness and headaches.  Endo/Heme/Allergies:  Negative for polydipsia.  Psychiatric/Behavioral: Negative.    All other systems reviewed and are negative.   Physical Exam: Estimated body mass index is 24.93 kg/m as calculated from the following:   Height as of 07/17/22: 5' 8.5" (1.74 m).   Weight as of 07/17/22: 166 lb 6.4 oz (75.5 kg). There were no vitals taken for this visit. General Appearance: Well nourished, in no apparent distress.  Eyes: PERRLA, EOMs, conjunctiva no swelling or erythema Sinuses: No Frontal/maxillary tenderness  ENT/Mouth: Ext aud canals clear, normal light reflex with TMs without erythema, bulging. Good dentition. No erythema, swelling, or  exudate on post pharynx. Tonsils not swollen or erythematous. Hearing normal.  Neck: Supple, thyroid normal. No bruits  Respiratory: Respiratory effort normal, BS equal bilaterally without rales, rhonchi, wheezing or stridor.  Cardio: RRR without murmurs, rubs or gallops. Brisk peripheral pulses without  edema.  Abdomen: Soft, nontender, no guarding, rebound, hernias, masses, or organomegaly.  Lymphatics: Non tender without lymphadenopathy.  Musculoskeletal: Full ROM all peripheral extremities,5/5 strength, and normal gait.  Skin: Warm, dry without rashes, ecchymosis. Bil feet onychomycosis. Well healed R 3rd digit lateral incision site.  Neuro: Cranial nerves intact, reflexes equal bilaterally. Normal muscle tone, no cerebellar symptoms. Sensation intact bil upper and lower extremities.  Psych: Awake and oriented X 3, normal affect, Insight and Judgment appropriate.    Darnice Comrie E  1:22 PM [facility]

## 2023-01-31 ENCOUNTER — Encounter: Payer: Self-pay | Admitting: Nurse Practitioner

## 2023-01-31 ENCOUNTER — Ambulatory Visit (INDEPENDENT_AMBULATORY_CARE_PROVIDER_SITE_OTHER): Payer: Self-pay | Admitting: Nurse Practitioner

## 2023-01-31 VITALS — BP 138/88 | HR 54 | Temp 97.7°F | Ht 68.5 in | Wt 183.4 lb

## 2023-01-31 DIAGNOSIS — E1122 Type 2 diabetes mellitus with diabetic chronic kidney disease: Secondary | ICD-10-CM

## 2023-01-31 DIAGNOSIS — F40231 Fear of injections and transfusions: Secondary | ICD-10-CM

## 2023-01-31 DIAGNOSIS — Z91148 Patient's other noncompliance with medication regimen for other reason: Secondary | ICD-10-CM

## 2023-01-31 DIAGNOSIS — Z8673 Personal history of transient ischemic attack (TIA), and cerebral infarction without residual deficits: Secondary | ICD-10-CM

## 2023-01-31 DIAGNOSIS — Z79899 Other long term (current) drug therapy: Secondary | ICD-10-CM

## 2023-01-31 DIAGNOSIS — E559 Vitamin D deficiency, unspecified: Secondary | ICD-10-CM

## 2023-01-31 DIAGNOSIS — Z6824 Body mass index (BMI) 24.0-24.9, adult: Secondary | ICD-10-CM

## 2023-01-31 DIAGNOSIS — E785 Hyperlipidemia, unspecified: Secondary | ICD-10-CM

## 2023-01-31 DIAGNOSIS — N182 Chronic kidney disease, stage 2 (mild): Secondary | ICD-10-CM

## 2023-01-31 DIAGNOSIS — I10 Essential (primary) hypertension: Secondary | ICD-10-CM

## 2023-01-31 DIAGNOSIS — E663 Overweight: Secondary | ICD-10-CM

## 2023-01-31 DIAGNOSIS — B351 Tinea unguium: Secondary | ICD-10-CM

## 2023-01-31 DIAGNOSIS — E1169 Type 2 diabetes mellitus with other specified complication: Secondary | ICD-10-CM

## 2023-01-31 LAB — HEMOGLOBIN A1C
Hgb A1c MFr Bld: 6.4 % of total Hgb — ABNORMAL HIGH (ref <5.7)
Mean Plasma Glucose: 137 mg/dL
eAG (mmol/L): 7.6 mmol/L

## 2023-01-31 NOTE — Patient Instructions (Signed)

## 2023-02-26 ENCOUNTER — Other Ambulatory Visit: Payer: Self-pay | Admitting: Nurse Practitioner

## 2023-02-26 DIAGNOSIS — E1169 Type 2 diabetes mellitus with other specified complication: Secondary | ICD-10-CM

## 2023-04-05 ENCOUNTER — Other Ambulatory Visit: Payer: Self-pay | Admitting: Internal Medicine

## 2023-04-05 DIAGNOSIS — I10 Essential (primary) hypertension: Secondary | ICD-10-CM

## 2023-04-09 ENCOUNTER — Other Ambulatory Visit: Payer: Self-pay | Admitting: Nurse Practitioner

## 2023-04-09 DIAGNOSIS — E118 Type 2 diabetes mellitus with unspecified complications: Secondary | ICD-10-CM

## 2023-04-27 ENCOUNTER — Other Ambulatory Visit: Payer: Self-pay | Admitting: Nurse Practitioner

## 2023-04-27 DIAGNOSIS — I10 Essential (primary) hypertension: Secondary | ICD-10-CM

## 2023-04-28 ENCOUNTER — Other Ambulatory Visit: Payer: Self-pay | Admitting: Nurse Practitioner

## 2023-04-28 DIAGNOSIS — Z8673 Personal history of transient ischemic attack (TIA), and cerebral infarction without residual deficits: Secondary | ICD-10-CM

## 2023-05-02 NOTE — Progress Notes (Deleted)
 3 MONTH FOLLOW UP  Assessment and Plan:   White coat syndrome with hypertension Continue amlodipine 10 mg every day and Hyzaar 100/25 mg QD Discussed DASH (Dietary Approaches to Stop Hypertension) DASH diet is lower in sodium than a typical American diet. Cut back on foods that are high in saturated fat, cholesterol, and trans fats. Eat more whole-grain foods, fish, poultry, and nuts Remain active and exercise as tolerated daily.  Monitor BP at home-Call if greater than 130/80.  Check and monitor CMP/CBC-Refuses today -defers to 07/2022   History of CVA (cerebrovascular accident) Continue Plavix Control blood pressure, cholesterol, increase exercise   T2_NIDDM (HCC) A1c today- not checking blood sugars, last A1c 11.6 Increase metformin to twice a day and take whole tab of glipizide 5 mg twice a day- if A1c remains elevated would need to increase Metformin to 2 tabs BID- cannot afford Rybelsus and refuses injectables.  Discussed importance of keeping A1c monitored and controlled Discussed the risks of uncontrolled diabetes including amputation, kidney function decrease and retinopathy. Education: Reviewed 'ABCs' of diabetes management  Discussed goals to be met and/or maintained include A1C (<7) Blood pressure (<130/80) Cholesterol (LDL <70) Continue Eye Exam yearly - has not had for several years Discussed dietary recommendations- given nutrition information to start decreasing simple carbs Discussed Physical Activity recommendations   Hyperlipidemia associated with type 2 diabetes mellitus (HCC) Controlled with elevated triglycerides. Discussed lifestyle modifications. Recommended diet heavy in fruits and veggies, omega 3's. Decrease consumption of animal meats, cheeses, and dairy products. Remain active and exercise as tolerated. Continue to monitor. Check and  lipids/TSH - refuses today - defer to 07/2023  CKD stage 2 due to type 2 diabetes mellitus (HCC) Continue  ARB, statin, plavix Discussed how what you eat and drink can aide in kidney protection. Stay well hydrated. Avoid high salt foods. Avoid NSAIDS. Keep BP and BG well controlled.   Take medications as prescribed. Remain active and exercise as tolerated daily. Maintain weight.  Continue to monitor. Check and monitor CMP/GFR/Microablumin - refuses today   Vitamin D deficiency Continue supplement  Medication management All medications discussed and reviewed in full. All questions and concerns regarding medications addressed.    Overweight (BMI 25.0-29.9) Has gained 17 pounds. Not following any diet restrictions Diet modification- decrease saturated fats and simple carbs Increase Physical activity.   Poor compliance with medication Discussed importance of taking medication as directed and follow ups every 3 months  Severe needle phobia Benzo if needed; limit lab sticks to minimum necessary Has been able to complete lab work last 3 visits without Benzo.   Discussed med's effects and SE's. Screening labs and tests as requested with regular follow-up as recommended.  Over 20 minutes of exam, counseling, chart review and critical decision making was performed  Future Appointments  Date Time Provider Department Center  05/06/2023  2:30 PM Raynelle Dick, NP [facility] None  07/18/2023  3:00 PM Raynelle Dick, NP [facility] None     HPI 62 y.o. patient presents for 3 month follow up for T2DM with CKD, htn, hyperlipidemia, vit D def.   Patient is noncompliant with treatment regimen.  He is to have appointment every three months due to uncontrolled diabetes.   Patient has hx/o Severe labile HTN predating since 2008 complicated by poor insight /comprehension and very poor compliance attributed to his anxiety re: having venipuncture. In July 2020, patient was hospitalized with a left cerebellar CVA. Cardiac 2D echo, Carotid Aa U/S, Bubble test and TEE  were negative. He was  started on ASA & Plavix, he reports never started aspirin. He continues not to take.  His blood pressure has been controlled at home (128//70-80s), today their BP is    BP Readings from Last 3 Encounters:  01/31/23 138/88  07/17/22 (!) 160/90  04/17/22 (!) 150/98  Long hx of white coat hypertension.  He does workout. He denies chest pain, shortness of breath, dizziness.   BMI is There is no height or weight on file to calculate BMI., he has not been working on diet and exercise. Wt Readings from Last 3 Encounters:  01/31/23 183 lb 6.4 oz (83.2 kg)  07/17/22 166 lb 6.4 oz (75.5 kg)  04/17/22 162 lb (73.5 kg)    He is on cholesterol medication, atorvastatin 80 mg every day and denies myalgias. His cholesterol is at goal. The cholesterol last visit was:   Lab Results  Component Value Date   CHOL 137 07/17/2022   HDL 43 07/17/2022   LDLCALC 65 07/17/2022   TRIG 230 (H) 07/17/2022   CHOLHDL 3.2 07/17/2022   He has been working on diet and exercise for T2DM with CKD (A1C 10.1% in 05/2019), much improved by lifestyle and metformin (taking 1 tab daily only, has GI SE with higher dose, but was asked to take 2 in 11/2021, from elevated A1c- never started 2 metformin and taking Glipizide 5 mg 1/2 tab BID).he is on ACE/ARB and denies increased appetite, nausea, paresthesia of the feet, polydipsia, polyuria and visual disturbances.  He does not check BG d/t needle phobia.  He has not been limiting any simple sugars Last A1C in the office was:  Lab Results  Component Value Date   HGBA1C 6.4 (H) 01/31/2023   Last GFR: Lab Results  Component Value Date   EGFR 69 07/17/2022    Patient is not on Vitamin D supplement,  Lab Results  Component Value Date   VD25OH 40 07/17/2022       Current Medications:  Current Outpatient Medications on File Prior to Visit  Medication Sig Dispense Refill   amLODipine (NORVASC) 10 MG tablet TAKE 1 TABLET BY MOUTH NIGHTLY AT BEDTIME FOR BLOOD PRESSURE 90  tablet 0   atorvastatin (LIPITOR) 80 MG tablet Take  1 tablet   Daily  for Cholesterol                                                                      /                                                                   TAKE                                         BY  MOUTH 90 tablet 1   Cholecalciferol (VITAMIN D) 125 MCG (5000 UT) CAPS Take by mouth.     clopidogrel (PLAVIX) 75 MG tablet Take 1 tablet by mouth once daily 90 tablet 0   glipiZIDE (GLUCOTROL) 5 MG tablet Take 1 tablet (5 mg total) by mouth 2 (two) times daily. 60 tablet 11   losartan-hydrochlorothiazide (HYZAAR) 100-25 MG tablet Take 1 tablet by mouth once daily for blood pressure 90 tablet 0   metFORMIN (GLUCOPHAGE-XR) 500 MG 24 hr tablet TAKE 1 TO 2 TABLETS BY MOUTH TWICE DAILY WITH MEALS FOR  DIABETES 360 tablet 0   Omega-3 Fatty Acids (FISH OIL PO) Take by mouth.     No current facility-administered medications on file prior to visit.   Allergies:  Allergies  Allergen Reactions   Atenolol Other (See Comments)    Made tired   Eye: has been several years Patient Care Team: Lucky Cowboy, MD as PCP - General (Internal Medicine)  Medical History:  has Severe needle phobia; Hyperlipidemia associated with type 2 diabetes mellitus (HCC); White coat syndrome with hypertension; T2_NIDDM (HCC); Vitamin D deficiency; Medication management; Overweight (BMI 25.0-29.9); Poor compliance with medication; Amaurosis fugax; History of CVA (cerebrovascular accident); Hypertension; CKD stage 2 due to type 2 diabetes mellitus (HCC); Family history of heart attack; and History of SCC (squamous cell carcinoma) of skin on their problem list. Surgical History:  He  has a past surgical history that includes TEE without cardioversion (N/A, 03/02/2019); Bubble study (03/02/2019); and Refractive surgery (Bilateral, 1991). Family History:  His family history includes Dementia (age of onset: 39) in  his father; Heart attack in his maternal grandfather; Heart attack (age of onset: 52) in his paternal grandfather; Heart disease in his father; Hyperlipidemia in his father. Social History:   reports that he has never smoked. He has never used smokeless tobacco. He reports that he does not drink alcohol and does not use drugs.   Review of Systems:  Review of Systems  Constitutional:  Negative for malaise/fatigue and weight loss.  HENT:  Negative for hearing loss and tinnitus.   Eyes:  Negative for blurred vision and double vision.  Respiratory:  Negative for cough, shortness of breath and wheezing.   Cardiovascular:  Negative for chest pain, palpitations, orthopnea, claudication and leg swelling.  Gastrointestinal:  Negative for abdominal pain, blood in stool, constipation, diarrhea, heartburn, melena, nausea and vomiting.  Genitourinary: Negative.   Musculoskeletal:  Negative for joint pain and myalgias.  Skin:  Negative for rash.  Neurological:  Negative for dizziness, tingling, sensory change, weakness and headaches.  Endo/Heme/Allergies:  Negative for polydipsia.  Psychiatric/Behavioral: Negative.    All other systems reviewed and are negative.   Physical Exam: Estimated body mass index is 27.48 kg/m as calculated from the following:   Height as of 01/31/23: 5' 8.5" (1.74 m).   Weight as of 01/31/23: 183 lb 6.4 oz (83.2 kg). There were no vitals taken for this visit. General Appearance: Well nourished, in no apparent distress.  Eyes: PERRLA, EOMs, conjunctiva no swelling or erythema Sinuses: No Frontal/maxillary tenderness  ENT/Mouth: Ext aud canals clear, normal light reflex with TMs without erythema, bulging. Good dentition. No erythema, swelling, or exudate on post pharynx. Tonsils not swollen or erythematous. Hearing normal.  Neck: Supple, thyroid normal. No bruits  Respiratory: Respiratory effort normal, BS equal bilaterally without rales, rhonchi, wheezing or stridor.   Cardio: RRR without murmurs, rubs or gallops. Brisk peripheral pulses without edema.  Abdomen: Soft, nontender, no  guarding, rebound, hernias, masses, or organomegaly.  Lymphatics: Non tender without lymphadenopathy.  Musculoskeletal: Full ROM all peripheral extremities,5/5 strength, and normal gait.  Skin: Warm, dry without rashes, ecchymosis.  Neuro: Cranial nerves intact, reflexes equal bilaterally. Normal muscle tone, no cerebellar symptoms. Sensation intact bil upper and lower extremities.  Psych: Awake and oriented X 3, flat affect, Insight and Judgment appropriate.    Jaana Brodt E  3:55 PM [facility]

## 2023-05-06 ENCOUNTER — Ambulatory Visit: Payer: 59 | Admitting: Nurse Practitioner

## 2023-05-06 DIAGNOSIS — F40231 Fear of injections and transfusions: Secondary | ICD-10-CM

## 2023-05-06 DIAGNOSIS — E1169 Type 2 diabetes mellitus with other specified complication: Secondary | ICD-10-CM

## 2023-05-06 DIAGNOSIS — E663 Overweight: Secondary | ICD-10-CM

## 2023-05-06 DIAGNOSIS — Z79899 Other long term (current) drug therapy: Secondary | ICD-10-CM

## 2023-05-06 DIAGNOSIS — I10 Essential (primary) hypertension: Secondary | ICD-10-CM

## 2023-05-06 DIAGNOSIS — E559 Vitamin D deficiency, unspecified: Secondary | ICD-10-CM

## 2023-05-06 DIAGNOSIS — Z8673 Personal history of transient ischemic attack (TIA), and cerebral infarction without residual deficits: Secondary | ICD-10-CM

## 2023-05-06 DIAGNOSIS — Z91148 Patient's other noncompliance with medication regimen for other reason: Secondary | ICD-10-CM

## 2023-05-06 DIAGNOSIS — E1122 Type 2 diabetes mellitus with diabetic chronic kidney disease: Secondary | ICD-10-CM

## 2023-05-10 NOTE — Progress Notes (Unsigned)
3 MONTH FOLLOW UP  Assessment and Plan:   White coat syndrome with hypertension Continue amlodipine 10 mg every day and Hyzaar 100/25 mg QD Discussed DASH (Dietary Approaches to Stop Hypertension) DASH diet is lower in sodium than a typical American diet. Cut back on foods that are high in saturated fat, cholesterol, and trans fats. Eat more whole-grain foods, fish, poultry, and nuts Remain active and exercise as tolerated daily.  Monitor BP at home-Call if greater than 130/80.  Check and monitor CMP/CBC-Refuses today -defers to 07/2022   History of CVA (cerebrovascular accident) He is on clopidogrel 75 mg daily Control blood pressure, cholesterol, increase exercise   T2 Diabetes Mellitus with Stage 2 [facility]) A1c today- not checking blood sugars, last A1c 6.4 Takes metformin 500 mg once a day and take whole tab of glipizide 5 mg 1/2 twice a day- cannot afford Rybelsus and refuses injectables.  Discussed importance of keeping A1c monitored and controlled Discussed the risks of uncontrolled diabetes including amputation, kidney function decrease and retinopathy. Education: Reviewed 'ABCs' of diabetes management  Discussed goals to be met and/or maintained include A1C (<7) Blood pressure (<130/80) Cholesterol (LDL <70) Continue Eye Exam yearly - has not had for several years Discussed dietary recommendations- given nutrition information to start decreasing simple carbs Discussed Physical Activity recommendations Declines A1c today, will check at CPE  Hyperlipidemia associated with type 2 diabetes mellitus (HCC) Controlled with elevated triglycerides. Discussed lifestyle modifications. Recommended diet heavy in fruits and veggies, omega 3's. Decrease consumption of animal meats, cheeses, and dairy products. Remain active and exercise as tolerated. Continue to monitor. Check and  lipids/TSH - refuses today - defer to 07/2023  CKD stage 2 due to type 2 diabetes mellitus  (HCC) Continue ARB, statin, plavix Discussed how what you eat and drink can aide in kidney protection. Stay well hydrated. Avoid high salt foods. Avoid NSAIDS. Keep BP and BG well controlled.   Take medications as prescribed. Remain active and exercise as tolerated daily. Maintain weight.  Continue to monitor. Check and monitor CMP/GFR/Microablumin - refuses today   Vitamin D deficiency Continue supplement  Medication management All medications discussed and reviewed in full. All questions and concerns regarding medications addressed.    Overweight (BMI 25.0-29.9) Has gained 17 pounds. Not following any diet restrictions Diet modification- decrease saturated fats and simple carbs Increase Physical activity.  Laceration of ring finger of left hand Continue to keep clean and dry- appears to be healing well no signs of infection Declines tetanus shot  Poor compliance with medication Discussed importance of taking medication as directed and follow ups every 3 months  Severe needle phobia Benzo if needed; limit lab sticks to minimum necessary Has been able to complete lab work last 3 visits without Benzo.   Discussed med's effects and SE's. Screening labs and tests as requested with regular follow-up as recommended.  Over 30 minutes of exam, counseling, chart review and critical decision making was performed  Future Appointments  Date Time Provider Department Center  07/18/2023  3:00 PM Raynelle Dick, NP [facility] None     HPI 62 y.o. patient presents for 3 month follow up for T2DM with CKD, htn, hyperlipidemia, vit D def.   Patient is noncompliant with treatment regimen.  He is to have appointment every three months due to uncontrolled diabetes.   Patient has hx/o Severe labile HTN predating since 2008 complicated by poor insight /comprehension and very poor compliance attributed to his anxiety re: having venipuncture. In July  2020, patient was hospitalized with  a left cerebellar CVA. Cardiac 2D echo, Carotid Aa U/S, Bubble test and TEE were negative. He was started on ASA & Plavix, he reports never started aspirin. He continues not to take.  His blood pressure has been controlled at home (128//70-80s),BP controlled amlodipine 10 mg every day and Losartan/hydrochlorothiazide 100/25 mg every day.  today their BP is BP: 138/80  BP Readings from Last 3 Encounters:  05/13/23 138/80  01/31/23 138/88  07/17/22 (!) 160/90  Long hx of white coat hypertension.  He does workout. He denies chest pain, shortness of breath, dizziness.   BMI is Body mass index is 28.32 kg/m., he has not been working on diet and exercise. His weight is going up, states he has been exercising.  Has not been doing well with his diet Wt Readings from Last 3 Encounters:  05/13/23 189 lb (85.7 kg)  01/31/23 183 lb 6.4 oz (83.2 kg)  07/17/22 166 lb 6.4 oz (75.5 kg)   He had root canal 1 week ago and was put on Amoxicillin and recently finished.  Still sore in neck area . Unable to open mouth the entire way.  He was to have crown but needs to be put off for 2 weeks.   He had a drill bit go through his left ring finger, declines tetanus shot. Area is well healed, no signs of infection.   He is on cholesterol medication, atorvastatin 80 mg every day and denies myalgias. His cholesterol is at goal. The cholesterol last visit was:   Lab Results  Component Value Date   CHOL 137 07/17/2022   HDL 43 07/17/2022   LDLCALC 65 07/17/2022   TRIG 230 (H) 07/17/2022   CHOLHDL 3.2 07/17/2022   He has been working on diet and exercise for T2DM with CKD (A1C 10.1% in 05/2019), much improved by lifestyle and metformin (taking 1 tab daily only, has GI SE with higher dose, but was asked to take 2 in 11/2021, from elevated A1c- never started 1 metformin 500 mg at supper and taking Glipizide 5 mg 1/2 tab BID with meal).he is on ACE/ARB and denies increased appetite, nausea, paresthesia of the feet,  polydipsia, polyuria and visual disturbances.  He does not check BG d/t needle phobia.  He has not been limiting any simple sugars Last A1C in the office was:  Lab Results  Component Value Date   HGBA1C 6.4 (H) 01/31/2023   Last GFR: Lab Results  Component Value Date   EGFR 69 07/17/2022    Patient is not on Vitamin D supplement,  Lab Results  Component Value Date   VD25OH 40 07/17/2022       Current Medications:  Current Outpatient Medications on File Prior to Visit  Medication Sig Dispense Refill   amLODipine (NORVASC) 10 MG tablet TAKE 1 TABLET BY MOUTH NIGHTLY AT BEDTIME FOR BLOOD PRESSURE 90 tablet 0   atorvastatin (LIPITOR) 80 MG tablet Take  1 tablet   Daily  for Cholesterol                                                                      /  TAKE                                         BY                                                 MOUTH 90 tablet 1   Cholecalciferol (VITAMIN D) 125 MCG (5000 UT) CAPS Take by mouth.     glipiZIDE (GLUCOTROL) 5 MG tablet Take 1 tablet (5 mg total) by mouth 2 (two) times daily. 60 tablet 11   losartan-hydrochlorothiazide (HYZAAR) 100-25 MG tablet Take 1 tablet by mouth once daily for blood pressure 90 tablet 0   metFORMIN (GLUCOPHAGE-XR) 500 MG 24 hr tablet TAKE 1 TO 2 TABLETS BY MOUTH TWICE DAILY WITH MEALS FOR  DIABETES 360 tablet 0   Omega-3 Fatty Acids (FISH OIL PO) Take by mouth.     clopidogrel (PLAVIX) 75 MG tablet Take 1 tablet by mouth once daily (Patient not taking: Reported on 05/13/2023) 90 tablet 0   No current facility-administered medications on file prior to visit.   Allergies:  Allergies  Allergen Reactions   Atenolol Other (See Comments)    Made tired   Eye: has been several years Patient Care Team: Lucky Cowboy, MD as PCP - General (Internal Medicine)  Medical History:  has Severe needle phobia; Hyperlipidemia associated with type 2  diabetes mellitus (HCC); White coat syndrome with hypertension; T2_NIDDM (HCC); Vitamin D deficiency; Medication management; Overweight (BMI 25.0-29.9); Poor compliance with medication; Amaurosis fugax; History of CVA (cerebrovascular accident); Hypertension; CKD stage 2 due to type 2 diabetes mellitus (HCC); Family history of heart attack; and History of SCC (squamous cell carcinoma) of skin on their problem list. Surgical History:  He  has a past surgical history that includes TEE without cardioversion (N/A, 03/02/2019); Bubble study (03/02/2019); and Refractive surgery (Bilateral, 1991). Family History:  His family history includes Dementia (age of onset: 70) in his father; Heart attack in his maternal grandfather; Heart attack (age of onset: 56) in his paternal grandfather; Heart disease in his father; Hyperlipidemia in his father. Social History:   reports that he has never smoked. He has never used smokeless tobacco. He reports that he does not drink alcohol and does not use drugs.   Review of Systems:  Review of Systems  Constitutional:  Negative for malaise/fatigue and weight loss.  HENT:  Negative for hearing loss and tinnitus.        Tooth pain from recent root canal  Eyes:  Negative for blurred vision and double vision.  Respiratory:  Negative for cough, shortness of breath and wheezing.   Cardiovascular:  Negative for chest pain, palpitations, orthopnea, claudication and leg swelling.  Gastrointestinal:  Negative for abdominal pain, blood in stool, constipation, diarrhea, heartburn, melena, nausea and vomiting.  Genitourinary: Negative.   Musculoskeletal:  Negative for joint pain and myalgias.  Skin:  Negative for rash.       Left 4th finger healing area where drill bit punctured skin  Neurological:  Negative for dizziness, tingling, sensory change, weakness and headaches.  Endo/Heme/Allergies:  Negative for polydipsia.  Psychiatric/Behavioral: Negative.    All other systems  reviewed and are negative.   Physical Exam: Estimated body mass index is 28.32 kg/m as calculated  from the following:   Height as of this encounter: 5' 8.5" (1.74 m).   Weight as of this encounter: 189 lb (85.7 kg). BP 138/80   Pulse 82   Temp 97.7 F (36.5 C)   Ht 5' 8.5" (1.74 m)   Wt 189 lb (85.7 kg)   SpO2 97%   BMI 28.32 kg/m  General Appearance: Well nourished, in no apparent distress.  Eyes: PERRLA, EOMs, conjunctiva no swelling or erythema Sinuses: No Frontal/maxillary tenderness  ENT/Mouth: Ext aud canals clear, normal light reflex with TMs without erythema, bulging. Good dentition. No signs of infection in the mouth. No erythema, swelling, or exudate on post pharynx.  Hearing normal.  Neck: Supple, thyroid normal. No bruits  Respiratory: Respiratory effort normal, BS equal bilaterally without rales, rhonchi, wheezing or stridor.  Cardio: RRR without murmurs, rubs or gallops. Brisk peripheral pulses without edema.  Abdomen: Soft, nontender, no guarding, rebound, hernias, masses, or organomegaly.  Lymphatics: Non tender without lymphadenopathy.  Musculoskeletal: Full ROM all peripheral extremities,5/5 strength, and normal gait.  Skin: Warm, dry. Left 4th finger laceration healing, no erythema or drainage  Neuro: Cranial nerves intact, reflexes equal bilaterally. Normal muscle tone, no cerebellar symptoms. Sensation intact bil upper and lower extremities.  Psych: Awake and oriented X 3, flat affect, Insight and Judgment appropriate.    Jesseca Marsch Hollie Salk 3:36 PM [facility]

## 2023-05-13 ENCOUNTER — Ambulatory Visit (INDEPENDENT_AMBULATORY_CARE_PROVIDER_SITE_OTHER): Payer: 59 | Admitting: Nurse Practitioner

## 2023-05-13 ENCOUNTER — Encounter: Payer: Self-pay | Admitting: Nurse Practitioner

## 2023-05-13 VITALS — BP 138/80 | HR 82 | Temp 97.7°F | Ht 68.5 in | Wt 189.0 lb

## 2023-05-13 DIAGNOSIS — Z8673 Personal history of transient ischemic attack (TIA), and cerebral infarction without residual deficits: Secondary | ICD-10-CM

## 2023-05-13 DIAGNOSIS — E663 Overweight: Secondary | ICD-10-CM

## 2023-05-13 DIAGNOSIS — E559 Vitamin D deficiency, unspecified: Secondary | ICD-10-CM | POA: Diagnosis not present

## 2023-05-13 DIAGNOSIS — Z91148 Patient's other noncompliance with medication regimen for other reason: Secondary | ICD-10-CM

## 2023-05-13 DIAGNOSIS — I10 Essential (primary) hypertension: Secondary | ICD-10-CM

## 2023-05-13 DIAGNOSIS — E785 Hyperlipidemia, unspecified: Secondary | ICD-10-CM | POA: Diagnosis not present

## 2023-05-13 DIAGNOSIS — E1122 Type 2 diabetes mellitus with diabetic chronic kidney disease: Secondary | ICD-10-CM | POA: Diagnosis not present

## 2023-05-13 DIAGNOSIS — F40231 Fear of injections and transfusions: Secondary | ICD-10-CM | POA: Diagnosis not present

## 2023-05-13 DIAGNOSIS — Z79899 Other long term (current) drug therapy: Secondary | ICD-10-CM

## 2023-05-13 DIAGNOSIS — N182 Chronic kidney disease, stage 2 (mild): Secondary | ICD-10-CM | POA: Diagnosis not present

## 2023-05-13 DIAGNOSIS — S61215A Laceration without foreign body of left ring finger without damage to nail, initial encounter: Secondary | ICD-10-CM

## 2023-05-13 DIAGNOSIS — E1169 Type 2 diabetes mellitus with other specified complication: Secondary | ICD-10-CM

## 2023-05-13 NOTE — Patient Instructions (Signed)

## 2023-07-17 NOTE — Progress Notes (Unsigned)
 Complete Physical  Assessment and Plan:  Gabriel Hoover was seen today for annual exam.  Diagnoses and all orders for this visit:  Encounter for routine history and physical examination Due annually  White coat syndrome with hypertension Not routinely taking BP at home; Increase Hyzaar back to 1 pill daily and keep BP log, call if develops fatigue Monitor blood pressure at home; call if consistently over 130/80 Continue DASH diet.   Reminder to go to the ER if any CP, SOB, nausea, dizziness, severe HA, changes vision/speech, left arm numbness and tingling and jaw pain.  History of CVA (cerebrovascular accident) Control blood pressure, cholesterol, increase exercise, ASA   T2_NIDDM (HCC) Type 2 Diabetes Mellitus - poorly controlled Education: Reviewed 'ABCs' of diabetes management (respective goals in parentheses):  A1C (<7), blood pressure (<130/80), and cholesterol (LDL <70) Eye Exam yearly and Dental Exam every 6 months.- exam report requested Foot exam completed Dietary recommendations Physical Activity recommendations Not checking sugars due to severe needle phobia - maintaining well without concerning sx of hypoglycemia; reduce glipizide if needed If A1c remains elevated above 9 will start back on glipizide- refuses all injectables - given foot care handout and explained the principles  - given instructions for hypoglycemia management - A1c  Hyperlipidemia associated with type 2 diabetes mellitus (HCC) Continue medications, LDL goal <70 Continue low cholesterol diet and exercise.   CKD stage 2 due to type 2 diabetes mellitus (HCC) Increase fluids, avoid NSAIDS, monitor sugars, will monitor  Vitamin D deficiency Start Vit D3 2000 units daily supplement Will recheck at next visit  Medication management Continued  Overweight (BMI 25.0-29.9) Long discussion about weight loss, diet, and exercise Recommended diet heavy in fruits and veggies and low in animal meats, cheeses, and  dairy products, appropriate calorie intake Discussed appropriate weight for height Follow up at next visit  Severe needle phobia Benzo if needed;   Poor compliance with medication Improved; continue to monitor; emphasized risks of non-compliance; patient appears motivated, denies barriers; keep pill burden low if possible    Medication management -     CBC with Differential/Platelet -     COMPLETE METABOLIC PANEL WITH GFR -     Lipid panel -     Hemoglobin A1c -     TSH -     Magnesium -     VITAMIN D 25 Hydroxy (Vit-D Deficiency, Fractures) -     EKG 12-Lead -     [place], RETROPERITNL ABD,  LTD -     Urinalysis, Routine w reflex microscopic -     Microalbumin / creatinine urine ratio -     PSA   Screening for ischemic heart disease -     EKG 12-Lead  Screening for hematuria or proteinuria -     Urinalysis, Routine w reflex microscopic -     Microalbumin / creatinine urine ratio  Screening for thyroid disorder -     TSH  Screening for prostate cancer -     PSA  Screening for AAA (abdominal aortic aneurysm) -     [place], RETROPERITNL ABD,  LTD        Discussed med's effects and SE's. Screening labs and tests as requested with regular follow-up as recommended. Over 40 minutes of exam, counseling, chart review and critical decision making was performed  Future Appointments  Date Time Provider Department Center  07/18/2023  3:00 PM Raynelle Dick, NP [facility] None  07/20/2024  3:00 PM Raynelle Dick, NP [facility] None  HPI 62 y.o. patient presents for a complete physical. He has Severe needle phobia; Hyperlipidemia associated with type 2 diabetes mellitus (HCC); White coat syndrome with hypertension; T2_NIDDM (HCC); Vitamin D deficiency; Medication management; Overweight (BMI 25.0-29.9); Poor compliance with medication; Amaurosis fugax; History of CVA (cerebrovascular accident); Hypertension; CKD stage 2 due to type 2 diabetes mellitus (HCC); Family history of  heart attack; and History of SCC (squamous cell carcinoma) of skin on their problem list.   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  2 kids, 1 grandson  Patient has hx/o Severe labile HTN predating since 2008 complicated by poor insight /comprehension and very poor compliance attributed to his anxiety re: having venipuncture.  In July 2020, patient was hospitalized with a left cerebellar CVA. Cardiac 2D echo, Carotid Aa U/S, Bubble test and TEE were negative. He was started on ASA 81 and Plavix, still taking plavix He does occasionally check BP at home and runs 130's/80's   BMI is There is no height or weight on file to calculate BMI., he has been working on diet and exercise. He drinks 8+ glasses of water daily, rare soda, does admit to 5 cups of coffee daily  Wt Readings from Last 3 Encounters:  05/13/23 189 lb (85.7 kg)  01/31/23 183 lb 6.4 oz (83.2 kg)  07/17/22 166 lb 6.4 oz (75.5 kg)   His blood pressure has been controlled at home (120/80s), today their BP is    BP Readings from Last 3 Encounters:  05/13/23 138/80  01/31/23 138/88  07/17/22 (!) 160/90   Long hx of white coat hypertension. Had been taking Hyzaar 100/25mg  daily and Amlodipine 10 mg QD. He is having to urinate all the time and would like to stop the HCTZ. He does workout. He denies chest pain, shortness of breath, dizziness.   He is on cholesterol medication, atorvastatin 80 mg QD and denies myalgias. His cholesterol is at goal. The cholesterol last visit was:   Lab Results  Component Value Date   CHOL 137 07/17/2022   HDL 43 07/17/2022   LDLCALC 65 07/17/2022   TRIG 230 (H) 07/17/2022   CHOLHDL 3.2 07/17/2022   He has been working on diet and exercise for T2DM with CKD (A1C 10.1% in 05/2019), much improved by lifestyle and metformin (taking 1 tab daily only, has GI SE with higher dose), he is on bASA, he is on ACE/ARB and denies increased appetite, nausea, paresthesia of the feet,  polydipsia, polyuria and visual disturbances. Does not check his blood sugars because he hates needles.  Last A1C in the office was:  Lab Results  Component Value Date   HGBA1C 6.4 (H) 01/31/2023   Drinking lots of water Last GFR: Lab Results  Component Value Date   EGFR 69 07/17/2022     Patient is not on Vitamin D supplement, is not currently taking a supplement Lab Results  Component Value Date   VD25OH 40 07/17/2022     Last PSA was: Lab Results  Component Value Date   PSA 1.32 07/17/2022     Current Medications:  Current Outpatient Medications on File Prior to Visit  Medication Sig Dispense Refill   amLODipine (NORVASC) 10 MG tablet TAKE 1 TABLET BY MOUTH NIGHTLY AT BEDTIME FOR BLOOD PRESSURE 90 tablet 0   atorvastatin (LIPITOR) 80 MG tablet Take  1 tablet   Daily  for Cholesterol                                                                      /  TAKE                                         BY                                                 MOUTH 90 tablet 1   Cholecalciferol (VITAMIN D) 125 MCG (5000 UT) CAPS Take by mouth.     clopidogrel (PLAVIX) 75 MG tablet Take 1 tablet by mouth once daily (Patient not taking: Reported on 05/13/2023) 90 tablet 0   glipiZIDE (GLUCOTROL) 5 MG tablet Take 1 tablet (5 mg total) by mouth 2 (two) times daily. 60 tablet 11   losartan-hydrochlorothiazide (HYZAAR) 100-25 MG tablet Take 1 tablet by mouth once daily for blood pressure 90 tablet 0   metFORMIN (GLUCOPHAGE-XR) 500 MG 24 hr tablet TAKE 1 TO 2 TABLETS BY MOUTH TWICE DAILY WITH MEALS FOR  DIABETES 360 tablet 0   Omega-3 Fatty Acids (FISH OIL PO) Take by mouth.     No current facility-administered medications on file prior to visit.   Allergies:  Allergies  Allergen Reactions   Atenolol Other (See Comments)    Made tired   Health Maintenance:   There is no immunization history on file for this  patient.  Declines all immunizations   Colonoscopy: - declines, willing to consider cologuard  Eye Exam: [facility], Dr.  Dentist: - 2020, sees his sister who is hygienist when in town  Patient Care Team: Lucky Cowboy, MD as PCP - General (Internal Medicine)  Medical History:  has Severe needle phobia; Hyperlipidemia associated with type 2 diabetes mellitus (HCC); White coat syndrome with hypertension; T2_NIDDM (HCC); Vitamin D deficiency; Medication management; Overweight (BMI 25.0-29.9); Poor compliance with medication; Amaurosis fugax; History of CVA (cerebrovascular accident); Hypertension; CKD stage 2 due to type 2 diabetes mellitus (HCC); Family history of heart attack; and History of SCC (squamous cell carcinoma) of skin on their problem list. Surgical History:  He  has a past surgical history that includes TEE without cardioversion (N/A, 03/02/2019); Bubble study (03/02/2019); and Refractive surgery (Bilateral, 1991). Family History:  His family history includes Dementia (age of onset: 92) in his father; Heart attack in his maternal grandfather; Heart attack (age of onset: 59) in his paternal grandfather; Heart disease in his father; Hyperlipidemia in his father. Social History:   reports that he has never smoked. He has never used smokeless tobacco. He reports that he does not drink alcohol and does not use drugs.   Review of Systems:  Review of Systems  Constitutional:  Negative for malaise/fatigue and weight loss.  HENT:  Negative for hearing loss and tinnitus.   Eyes:  Negative for blurred vision and double vision.  Respiratory:  Negative for cough, shortness of breath and wheezing.   Cardiovascular:  Negative for chest pain, palpitations, orthopnea, claudication and leg swelling.  Gastrointestinal:  Negative for abdominal pain, blood in stool, constipation, diarrhea, heartburn, melena, nausea and vomiting.  Genitourinary: Negative.   Musculoskeletal:  Negative for  joint pain and myalgias.  Skin:  Negative for rash.  Neurological:  Negative for dizziness, tingling, sensory change, weakness and headaches.  Endo/Heme/Allergies:  Negative for polydipsia.  Psychiatric/Behavioral: Negative.    All other systems reviewed and are  negative.   Physical Exam: Estimated body mass index is 28.32 kg/m as calculated from the following:   Height as of 05/13/23: 5' 8.5" (1.74 m).   Weight as of 05/13/23: 189 lb (85.7 kg). There were no vitals taken for this visit. General Appearance: Well nourished, in no apparent distress.  Eyes: PERRLA, EOMs, conjunctiva no swelling or erythema Sinuses: No Frontal/maxillary tenderness  ENT/Mouth: Ext aud canals clear, normal light reflex with TMs without erythema, bulging. Good dentition. No erythema, swelling, or exudate on post pharynx. Tonsils not swollen or erythematous. Hearing normal.  Neck: Supple, thyroid normal. No bruits  Respiratory: Respiratory effort normal, BS equal bilaterally without rales, rhonchi, wheezing or stridor.  Cardio: RRR without murmurs, rubs or gallops. Brisk peripheral pulses without edema.  Chest: symmetric, with normal excursions and percussion.  Abdomen: Soft, nontender, no guarding, rebound, hernias, masses, or organomegaly.  Lymphatics: Non tender without lymphadenopathy.  Genitourinary: no concerns, defer Musculoskeletal: Full ROM all peripheral extremities,5/5 strength, and normal gait.  Skin: Warm, dry without rashes, ecchymosis. Bil feet onycomycosis, R 3rd digit incision healed well. Toenail fungus grown out to mid nail Neuro: Cranial nerves intact, reflexes equal bilaterally. Normal muscle tone, no cerebellar symptoms. Sensation intact bil upper and lower extremities.  Psych: Awake and oriented X 3, normal affect, Insight and Judgment appropriate.   EKG: Sinus bradycardia, no ST changes   Roxanna Mcever E  12:43 PM [facility]

## 2023-07-18 ENCOUNTER — Encounter: Payer: Self-pay | Admitting: Nurse Practitioner

## 2023-07-18 ENCOUNTER — Ambulatory Visit (INDEPENDENT_AMBULATORY_CARE_PROVIDER_SITE_OTHER): Payer: 59 | Admitting: Nurse Practitioner

## 2023-07-18 VITALS — BP 140/76 | HR 94 | Temp 98.1°F | Ht 68.5 in | Wt 197.0 lb

## 2023-07-18 DIAGNOSIS — E1169 Type 2 diabetes mellitus with other specified complication: Secondary | ICD-10-CM

## 2023-07-18 DIAGNOSIS — E663 Overweight: Secondary | ICD-10-CM

## 2023-07-18 DIAGNOSIS — Z0000 Encounter for general adult medical examination without abnormal findings: Secondary | ICD-10-CM

## 2023-07-18 DIAGNOSIS — N182 Chronic kidney disease, stage 2 (mild): Secondary | ICD-10-CM | POA: Diagnosis not present

## 2023-07-18 DIAGNOSIS — I10 Essential (primary) hypertension: Secondary | ICD-10-CM

## 2023-07-18 DIAGNOSIS — E1122 Type 2 diabetes mellitus with diabetic chronic kidney disease: Secondary | ICD-10-CM

## 2023-07-18 DIAGNOSIS — F40231 Fear of injections and transfusions: Secondary | ICD-10-CM

## 2023-07-18 DIAGNOSIS — Z79899 Other long term (current) drug therapy: Secondary | ICD-10-CM

## 2023-07-18 DIAGNOSIS — Z91148 Patient's other noncompliance with medication regimen for other reason: Secondary | ICD-10-CM

## 2023-07-18 DIAGNOSIS — Z1329 Encounter for screening for other suspected endocrine disorder: Secondary | ICD-10-CM | POA: Diagnosis not present

## 2023-07-18 DIAGNOSIS — E559 Vitamin D deficiency, unspecified: Secondary | ICD-10-CM

## 2023-07-18 DIAGNOSIS — Z136 Encounter for screening for cardiovascular disorders: Secondary | ICD-10-CM | POA: Diagnosis not present

## 2023-07-18 DIAGNOSIS — Z125 Encounter for screening for malignant neoplasm of prostate: Secondary | ICD-10-CM | POA: Diagnosis not present

## 2023-07-18 DIAGNOSIS — E785 Hyperlipidemia, unspecified: Secondary | ICD-10-CM | POA: Diagnosis not present

## 2023-07-18 DIAGNOSIS — Z1389 Encounter for screening for other disorder: Secondary | ICD-10-CM | POA: Diagnosis not present

## 2023-07-18 DIAGNOSIS — Z8673 Personal history of transient ischemic attack (TIA), and cerebral infarction without residual deficits: Secondary | ICD-10-CM

## 2023-07-18 DIAGNOSIS — Z0001 Encounter for general adult medical examination with abnormal findings: Secondary | ICD-10-CM

## 2023-07-18 NOTE — Patient Instructions (Signed)

## 2023-07-19 LAB — URINALYSIS, ROUTINE W REFLEX MICROSCOPIC
Bilirubin Urine: NEGATIVE
Glucose, UA: NEGATIVE
Hgb urine dipstick: NEGATIVE
Ketones, ur: NEGATIVE
Leukocytes,Ua: NEGATIVE
Nitrite: NEGATIVE
Protein, ur: NEGATIVE
Specific Gravity, Urine: 1.016 (ref 1.001–1.035)
pH: 7 (ref 5.0–8.0)

## 2023-07-19 LAB — COMPLETE METABOLIC PANEL WITH GFR
AG Ratio: 1.9 (calc) (ref 1.0–2.5)
ALT: 26 U/L (ref 9–46)
AST: 18 U/L (ref 10–35)
Albumin: 4.9 g/dL (ref 3.6–5.1)
Alkaline phosphatase (APISO): 137 U/L (ref 35–144)
BUN: 17 mg/dL (ref 7–25)
CO2: 31 mmol/L (ref 20–32)
Calcium: 9.9 mg/dL (ref 8.6–10.3)
Chloride: 101 mmol/L (ref 98–110)
Creat: 1.12 mg/dL (ref 0.70–1.35)
Globulin: 2.6 g/dL (ref 1.9–3.7)
Glucose, Bld: 147 mg/dL — ABNORMAL HIGH (ref 65–139)
Potassium: 3.5 mmol/L (ref 3.5–5.3)
Sodium: 142 mmol/L (ref 135–146)
Total Bilirubin: 0.8 mg/dL (ref 0.2–1.2)
Total Protein: 7.5 g/dL (ref 6.1–8.1)
eGFR: 74 mL/min/{1.73_m2} (ref >=60)

## 2023-07-19 LAB — CBC WITH DIFFERENTIAL/PLATELET
Absolute Lymphocytes: 2795 {cells}/uL (ref 850–3900)
Absolute Monocytes: 920 {cells}/uL (ref 200–950)
Basophils Absolute: 104 {cells}/uL (ref 0–200)
Basophils Relative: 0.9 %
Eosinophils Absolute: 288 {cells}/uL (ref 15–500)
Eosinophils Relative: 2.5 %
HCT: 44.8 % (ref 38.5–50.0)
Hemoglobin: 15.6 g/dL (ref 13.2–17.1)
MCH: 30.1 pg (ref 27.0–33.0)
MCHC: 34.8 g/dL (ref 32.0–36.0)
MCV: 86.3 fL (ref 80.0–100.0)
MPV: 10.6 fL (ref 7.5–12.5)
Monocytes Relative: 8 %
Neutro Abs: 7395 {cells}/uL (ref 1500–7800)
Neutrophils Relative %: 64.3 %
Platelets: 327 10*3/uL (ref 140–400)
RBC: 5.19 10*6/uL (ref 4.20–5.80)
RDW: 12.2 % (ref 11.0–15.0)
Total Lymphocyte: 24.3 %
WBC: 11.5 10*3/uL — ABNORMAL HIGH (ref 3.8–10.8)

## 2023-07-19 LAB — LIPID PANEL
Cholesterol: 129 mg/dL (ref <200)
HDL: 44 mg/dL (ref >=40)
LDL Cholesterol (Calc): 57 mg/dL
Non-HDL Cholesterol (Calc): 85 mg/dL (ref <130)
Total CHOL/HDL Ratio: 2.9 (calc) (ref <5.0)
Triglycerides: 201 mg/dL — ABNORMAL HIGH (ref <150)

## 2023-07-19 LAB — HEMOGLOBIN A1C W/OUT EAG: Hgb A1c MFr Bld: 6.3 %{Hb} — ABNORMAL HIGH (ref <5.7)

## 2023-07-19 LAB — VITAMIN D 25 HYDROXY (VIT D DEFICIENCY, FRACTURES): Vit D, 25-Hydroxy: 36 ng/mL (ref 30–100)

## 2023-07-19 LAB — TSH: TSH: 3.39 m[IU]/L (ref 0.40–4.50)

## 2023-07-19 LAB — MICROALBUMIN / CREATININE URINE RATIO
Creatinine, Urine: 102 mg/dL (ref 20–320)
Microalb Creat Ratio: 32 mg/g{creat} — ABNORMAL HIGH (ref <30)
Microalb, Ur: 3.3 mg/dL

## 2023-07-19 LAB — MAGNESIUM: Magnesium: 2 mg/dL (ref 1.5–2.5)

## 2023-07-19 LAB — PSA: PSA: 1.08 ng/mL (ref <=4.00)

## 2023-08-02 ENCOUNTER — Other Ambulatory Visit: Payer: Self-pay | Admitting: Nurse Practitioner

## 2023-08-02 DIAGNOSIS — Z8673 Personal history of transient ischemic attack (TIA), and cerebral infarction without residual deficits: Secondary | ICD-10-CM

## 2023-08-03 ENCOUNTER — Other Ambulatory Visit: Payer: Self-pay | Admitting: Nurse Practitioner

## 2023-08-03 DIAGNOSIS — I10 Essential (primary) hypertension: Secondary | ICD-10-CM

## 2023-08-19 ENCOUNTER — Other Ambulatory Visit: Payer: Self-pay | Admitting: Nurse Practitioner

## 2023-08-19 DIAGNOSIS — I10 Essential (primary) hypertension: Secondary | ICD-10-CM

## 2023-09-08 ENCOUNTER — Other Ambulatory Visit: Payer: Self-pay | Admitting: Internal Medicine

## 2023-09-08 DIAGNOSIS — E1169 Type 2 diabetes mellitus with other specified complication: Secondary | ICD-10-CM

## 2023-11-12 ENCOUNTER — Ambulatory Visit: Payer: 59 | Admitting: Nurse Practitioner

## 2024-07-20 ENCOUNTER — Encounter: Payer: 59 | Admitting: Nurse Practitioner
